# Patient Record
Sex: Male | Born: 1980
Health system: Southern US, Community
[De-identification: ages and names within clinical notes are randomized; demographics above are authoritative.]

## PROBLEM LIST (undated history)

## (undated) DIAGNOSIS — Z973 Presence of spectacles and contact lenses: Secondary | ICD-10-CM

## (undated) DIAGNOSIS — I82409 Acute embolism and thrombosis of unspecified deep veins of unspecified lower extremity: Secondary | ICD-10-CM

## (undated) DIAGNOSIS — T7840XA Allergy, unspecified, initial encounter: Secondary | ICD-10-CM

## (undated) DIAGNOSIS — J45909 Unspecified asthma, uncomplicated: Secondary | ICD-10-CM

## (undated) DIAGNOSIS — K219 Gastro-esophageal reflux disease without esophagitis: Secondary | ICD-10-CM

## (undated) DIAGNOSIS — K259 Gastric ulcer, unspecified as acute or chronic, without hemorrhage or perforation: Secondary | ICD-10-CM

## (undated) DIAGNOSIS — R7989 Other specified abnormal findings of blood chemistry: Secondary | ICD-10-CM

## (undated) DIAGNOSIS — I10 Essential (primary) hypertension: Secondary | ICD-10-CM

## (undated) DIAGNOSIS — G4730 Sleep apnea, unspecified: Secondary | ICD-10-CM

## (undated) DIAGNOSIS — F419 Anxiety disorder, unspecified: Secondary | ICD-10-CM

## (undated) DIAGNOSIS — G43909 Migraine, unspecified, not intractable, without status migrainosus: Secondary | ICD-10-CM

## (undated) DIAGNOSIS — F32A Depression, unspecified: Secondary | ICD-10-CM

## (undated) DIAGNOSIS — F909 Attention-deficit hyperactivity disorder, unspecified type: Secondary | ICD-10-CM

## (undated) HISTORY — DX: Gastro-esophageal reflux disease without esophagitis: K21.9

## (undated) HISTORY — DX: Sleep apnea, unspecified: G47.30

## (undated) HISTORY — DX: Allergy, unspecified, initial encounter: T78.40XA

## (undated) HISTORY — DX: Gastric ulcer, unspecified as acute or chronic, without hemorrhage or perforation: K25.9

## (undated) HISTORY — DX: Migraine, unspecified, not intractable, without status migrainosus: G43.909

## (undated) HISTORY — DX: Other specified abnormal findings of blood chemistry: R79.89

## (undated) HISTORY — DX: Presence of spectacles and contact lenses: Z97.3

## (undated) HISTORY — DX: Anxiety disorder, unspecified: F41.9

## (undated) HISTORY — DX: Depression, unspecified: F32.A

---
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
## 2001-02-20 ENCOUNTER — Emergency Department (HOSPITAL_COMMUNITY): Admission: EM | Admit: 2001-02-20 | Discharge: 2001-02-21 | Payer: Self-pay | Admitting: *Deleted

## 2001-04-20 ENCOUNTER — Emergency Department (HOSPITAL_COMMUNITY): Admission: EM | Admit: 2001-04-20 | Discharge: 2001-04-20 | Payer: Self-pay | Admitting: Emergency Medicine

## 2001-04-20 ENCOUNTER — Encounter: Payer: Self-pay | Admitting: Emergency Medicine

## 2002-08-19 ENCOUNTER — Encounter: Payer: Self-pay | Admitting: Emergency Medicine

## 2002-08-19 ENCOUNTER — Emergency Department (HOSPITAL_COMMUNITY): Admission: EM | Admit: 2002-08-19 | Discharge: 2002-08-19 | Payer: Self-pay | Admitting: Emergency Medicine

## 2004-04-07 ENCOUNTER — Emergency Department (HOSPITAL_COMMUNITY): Admission: EM | Admit: 2004-04-07 | Discharge: 2004-04-07 | Payer: Self-pay | Admitting: Emergency Medicine

## 2004-11-04 ENCOUNTER — Emergency Department (HOSPITAL_COMMUNITY): Admission: EM | Admit: 2004-11-04 | Discharge: 2004-11-04 | Payer: Self-pay | Admitting: Emergency Medicine

## 2005-03-17 HISTORY — PX: ESOPHAGOGASTRODUODENOSCOPY: SHX1529

## 2005-03-17 HISTORY — PX: COLONOSCOPY: SHX174

## 2005-04-06 ENCOUNTER — Emergency Department (HOSPITAL_COMMUNITY): Admission: EM | Admit: 2005-04-06 | Discharge: 2005-04-06 | Payer: Self-pay | Admitting: Emergency Medicine

## 2005-04-20 ENCOUNTER — Emergency Department (HOSPITAL_COMMUNITY): Admission: EM | Admit: 2005-04-20 | Discharge: 2005-04-21 | Payer: Self-pay | Admitting: Emergency Medicine

## 2005-08-30 ENCOUNTER — Emergency Department (HOSPITAL_COMMUNITY): Admission: EM | Admit: 2005-08-30 | Discharge: 2005-08-30 | Payer: Self-pay | Admitting: Emergency Medicine

## 2005-09-06 ENCOUNTER — Emergency Department (HOSPITAL_COMMUNITY): Admission: EM | Admit: 2005-09-06 | Discharge: 2005-09-06 | Payer: Self-pay | Admitting: Emergency Medicine

## 2005-12-18 ENCOUNTER — Emergency Department (HOSPITAL_COMMUNITY): Admission: EM | Admit: 2005-12-18 | Discharge: 2005-12-18 | Payer: Self-pay | Admitting: Emergency Medicine

## 2005-12-19 ENCOUNTER — Encounter (INDEPENDENT_AMBULATORY_CARE_PROVIDER_SITE_OTHER): Payer: Self-pay | Admitting: Specialist

## 2005-12-19 ENCOUNTER — Ambulatory Visit (HOSPITAL_COMMUNITY): Admission: RE | Admit: 2005-12-19 | Discharge: 2005-12-19 | Payer: Self-pay | Admitting: Gastroenterology

## 2006-01-08 ENCOUNTER — Emergency Department (HOSPITAL_COMMUNITY): Admission: EM | Admit: 2006-01-08 | Discharge: 2006-01-08 | Payer: Self-pay | Admitting: Emergency Medicine

## 2006-03-09 ENCOUNTER — Emergency Department (HOSPITAL_COMMUNITY): Admission: EM | Admit: 2006-03-09 | Discharge: 2006-03-09 | Payer: Self-pay | Admitting: Emergency Medicine

## 2006-03-17 ENCOUNTER — Emergency Department (HOSPITAL_COMMUNITY): Admission: EM | Admit: 2006-03-17 | Discharge: 2006-03-17 | Payer: Self-pay | Admitting: Emergency Medicine

## 2006-04-18 ENCOUNTER — Emergency Department (HOSPITAL_COMMUNITY): Admission: EM | Admit: 2006-04-18 | Discharge: 2006-04-19 | Payer: Self-pay | Admitting: Emergency Medicine

## 2006-04-19 ENCOUNTER — Emergency Department (HOSPITAL_COMMUNITY): Admission: EM | Admit: 2006-04-19 | Discharge: 2006-04-19 | Payer: Self-pay | Admitting: Family Medicine

## 2006-07-21 ENCOUNTER — Emergency Department (HOSPITAL_COMMUNITY): Admission: EM | Admit: 2006-07-21 | Discharge: 2006-07-21 | Payer: Self-pay | Admitting: Emergency Medicine

## 2006-08-31 ENCOUNTER — Emergency Department (HOSPITAL_COMMUNITY): Admission: EM | Admit: 2006-08-31 | Discharge: 2006-08-31 | Payer: Self-pay | Admitting: Emergency Medicine

## 2006-09-10 ENCOUNTER — Emergency Department (HOSPITAL_COMMUNITY): Admission: EM | Admit: 2006-09-10 | Discharge: 2006-09-10 | Payer: Self-pay | Admitting: Emergency Medicine

## 2007-05-10 ENCOUNTER — Emergency Department (HOSPITAL_COMMUNITY): Admission: EM | Admit: 2007-05-10 | Discharge: 2007-05-10 | Payer: Self-pay | Admitting: Emergency Medicine

## 2007-10-10 ENCOUNTER — Emergency Department (HOSPITAL_COMMUNITY): Admission: EM | Admit: 2007-10-10 | Discharge: 2007-10-10 | Payer: Self-pay | Admitting: Emergency Medicine

## 2007-10-29 ENCOUNTER — Emergency Department (HOSPITAL_COMMUNITY): Admission: EM | Admit: 2007-10-29 | Discharge: 2007-10-29 | Payer: Self-pay | Admitting: Emergency Medicine

## 2008-06-22 ENCOUNTER — Emergency Department (HOSPITAL_COMMUNITY): Admission: EM | Admit: 2008-06-22 | Discharge: 2008-06-22 | Payer: Self-pay | Admitting: Emergency Medicine

## 2008-12-17 ENCOUNTER — Emergency Department (HOSPITAL_COMMUNITY): Admission: EM | Admit: 2008-12-17 | Discharge: 2008-12-17 | Payer: Self-pay | Admitting: Emergency Medicine

## 2009-01-24 ENCOUNTER — Emergency Department (HOSPITAL_COMMUNITY): Admission: EM | Admit: 2009-01-24 | Discharge: 2009-01-24 | Payer: Self-pay | Admitting: Emergency Medicine

## 2009-03-16 ENCOUNTER — Emergency Department (HOSPITAL_COMMUNITY): Admission: EM | Admit: 2009-03-16 | Discharge: 2009-03-16 | Payer: Self-pay | Admitting: Emergency Medicine

## 2009-12-27 ENCOUNTER — Emergency Department (HOSPITAL_COMMUNITY): Admission: EM | Admit: 2009-12-27 | Discharge: 2009-12-27 | Payer: Self-pay | Admitting: Family Medicine

## 2010-06-17 LAB — COMPREHENSIVE METABOLIC PANEL
AST: 32 U/L (ref 0–37)
Chloride: 104 mEq/L (ref 96–112)
Creatinine, Ser: 1.12 mg/dL (ref 0.4–1.5)
GFR calc Af Amer: >60 mL/min (ref >60)
GFR calc non Af Amer: >60 mL/min (ref >60)
Total Bilirubin: 0.5 mg/dL (ref 0.3–1.2)

## 2010-06-17 LAB — DIFFERENTIAL
Eosinophils Relative: 1 % (ref 0–5)
Lymphs Abs: 3.1 10*3/uL (ref 0.7–4.0)
Monocytes Absolute: 0.6 10*3/uL (ref 0.1–1.0)
Monocytes Relative: 8 % (ref 3–12)
Neutro Abs: 3.4 10*3/uL (ref 1.7–7.7)

## 2010-06-17 LAB — CBC
HCT: 42.7 % (ref 39.0–52.0)
Hemoglobin: 14.6 g/dL (ref 13.0–17.0)
MCV: 86.1 fL (ref 78.0–100.0)
Platelets: 262 10*3/uL (ref 150–400)
RBC: 4.96 MIL/uL (ref 4.22–5.81)
RDW: 14 % (ref 11.5–15.5)
WBC: 7.2 10*3/uL (ref 4.0–10.5)

## 2010-07-07 ENCOUNTER — Emergency Department (HOSPITAL_COMMUNITY)
Admission: EM | Admit: 2010-07-07 | Discharge: 2010-07-07 | Disposition: A | Discharge status: Home / Self Care | Payer: Medicaid Other | Attending: Emergency Medicine | Admitting: Emergency Medicine

## 2010-07-07 DIAGNOSIS — R079 Chest pain, unspecified: Secondary | ICD-10-CM | POA: Insufficient documentation

## 2010-07-07 DIAGNOSIS — K219 Gastro-esophageal reflux disease without esophagitis: Secondary | ICD-10-CM | POA: Insufficient documentation

## 2010-07-07 DIAGNOSIS — M79609 Pain in unspecified limb: Secondary | ICD-10-CM

## 2010-10-17 ENCOUNTER — Emergency Department (HOSPITAL_COMMUNITY)
Admission: EM | Admit: 2010-10-17 | Discharge: 2010-10-18 | Disposition: A | Discharge status: Home / Self Care | Payer: Medicaid Other | Attending: Emergency Medicine | Admitting: Emergency Medicine

## 2010-10-17 ENCOUNTER — Emergency Department (HOSPITAL_COMMUNITY): Payer: Medicaid Other

## 2010-10-17 DIAGNOSIS — J189 Pneumonia, unspecified organism: Secondary | ICD-10-CM | POA: Insufficient documentation

## 2010-10-17 DIAGNOSIS — R05 Cough: Secondary | ICD-10-CM | POA: Insufficient documentation

## 2010-10-17 DIAGNOSIS — R509 Fever, unspecified: Secondary | ICD-10-CM | POA: Insufficient documentation

## 2010-10-17 DIAGNOSIS — R059 Cough, unspecified: Secondary | ICD-10-CM | POA: Insufficient documentation

## 2010-10-17 DIAGNOSIS — K219 Gastro-esophageal reflux disease without esophagitis: Secondary | ICD-10-CM | POA: Insufficient documentation

## 2010-10-17 LAB — CBC
MCH: 28.5 pg (ref 26.0–34.0)
Platelets: 210 10*3/uL (ref 150–400)
RBC: 4.88 MIL/uL (ref 4.22–5.81)

## 2010-10-17 LAB — POCT I-STAT, CHEM 8
BUN: 15 mg/dL (ref 6–23)
Calcium, Ion: 1.19 mmol/L (ref 1.12–1.32)
HCT: 43 % (ref 39.0–52.0)
Hemoglobin: 14.6 g/dL (ref 13.0–17.0)
Sodium: 138 mEq/L (ref 135–145)
TCO2: 28 mmol/L (ref 0–100)

## 2010-10-17 LAB — DIFFERENTIAL
Basophils Absolute: 0 10*3/uL (ref 0.0–0.1)
Basophils Relative: 0 % (ref 0–1)
Eosinophils Absolute: 0 10*3/uL (ref 0.0–0.7)
Monocytes Relative: 9 % (ref 3–12)
Neutrophils Relative %: 56 % (ref 43–77)

## 2010-12-13 LAB — DIFFERENTIAL
Eosinophils Relative: 0
Lymphocytes Relative: 36
Lymphs Abs: 2.5
Monocytes Absolute: 0.4

## 2010-12-13 LAB — URINALYSIS, ROUTINE W REFLEX MICROSCOPIC
Ketones, ur: NEGATIVE
Specific Gravity, Urine: 1.029
pH: 6.5

## 2010-12-13 LAB — CBC
MCHC: 34.5
MCV: 82.7
Platelets: 305

## 2010-12-13 LAB — COMPREHENSIVE METABOLIC PANEL
AST: 28
Albumin: 4.5
Calcium: 9.6
Creatinine, Ser: 1.04
GFR calc Af Amer: >60
Total Protein: 7.4

## 2010-12-13 LAB — POCT I-STAT, CHEM 8
BUN: 11
Calcium, Ion: 1.25
Creatinine, Ser: 1.2
TCO2: 28

## 2010-12-13 LAB — POCT CARDIAC MARKERS
Myoglobin, poc: 78.4
Operator id: 294521
Troponin i, poc: <0.05
Troponin i, poc: <0.05

## 2010-12-13 LAB — LIPASE, BLOOD: Lipase: 23

## 2011-01-01 LAB — CBC
HCT: 41.3
Hemoglobin: 13.8
MCHC: 33.5
MCV: 83
Platelets: 267
RDW: 13.6

## 2011-01-01 LAB — I-STAT 8, (EC8 V) (CONVERTED LAB)
Acid-Base Excess: 1
BUN: 12
Chloride: 105
Glucose, Bld: 95
Potassium: 4.2
pH, Ven: 7.32 — ABNORMAL HIGH

## 2011-01-01 LAB — URINALYSIS, ROUTINE W REFLEX MICROSCOPIC
Glucose, UA: NEGATIVE
Hgb urine dipstick: NEGATIVE
Ketones, ur: NEGATIVE
pH: 5.5

## 2011-01-01 LAB — RAPID URINE DRUG SCREEN, HOSP PERFORMED
Amphetamines: NOT DETECTED
Barbiturates: NOT DETECTED
Benzodiazepines: NOT DETECTED
Cocaine: NOT DETECTED

## 2011-01-01 LAB — DIFFERENTIAL
Basophils Absolute: 0
Basophils Relative: 1
Eosinophils Absolute: 0.1
Eosinophils Relative: 1
Monocytes Absolute: 0.6

## 2011-01-01 LAB — POCT I-STAT CREATININE
Creatinine, Ser: 1.2
Operator id: 285841

## 2011-01-01 LAB — POCT CARDIAC MARKERS: Troponin i, poc: <0.05

## 2011-08-04 ENCOUNTER — Ambulatory Visit: Payer: Managed Care, Other (non HMO)

## 2011-08-04 ENCOUNTER — Ambulatory Visit (INDEPENDENT_AMBULATORY_CARE_PROVIDER_SITE_OTHER): Payer: Managed Care, Other (non HMO) | Admitting: Family Medicine

## 2011-08-04 VITALS — BP 150/103 | HR 83 | Temp 98.2°F | Resp 20 | Wt 281.0 lb

## 2011-08-04 DIAGNOSIS — R1011 Right upper quadrant pain: Secondary | ICD-10-CM

## 2011-08-04 LAB — POCT CBC
HCT, POC: 40.8 % — AB (ref 43.5–53.7)
Hemoglobin: 13.3 g/dL — AB (ref 14.1–18.1)
Lymph, poc: 4.1 — AB (ref 0.6–3.4)
MCHC: 32.6 g/dL (ref 31.8–35.4)
MCV: 85.3 fL (ref 80–97)
POC Granulocyte: 3.5 (ref 2–6.9)

## 2011-08-04 MED ORDER — KETOROLAC TROMETHAMINE 60 MG/2ML IM SOLN
60.0000 mg | Freq: Once | INTRAMUSCULAR | Status: AC
  Administered 2011-08-04: 60 mg via INTRAMUSCULAR

## 2011-08-04 MED ORDER — HYDROCODONE-ACETAMINOPHEN 7.5-750 MG PO TABS: 1.0000 | ORAL_TABLET | Freq: Three times a day (TID) | ORAL | Status: AC | PRN

## 2011-08-04 NOTE — Progress Notes (Signed)
  Subjective:    Patient ID: Joel Wallace, male    DOB: [birth year], 30 y.o.   MRN: [id number]  HPI 31 yo male on Wellbutrin here for SOB, dizziness, and blurry vision.  Started about 4:30 at work.  At a cheeseburger about 3.  RUQ.  Severe.  Stopped briefly once and then resumed.  Can't characterize better.  Makes him SOB (hurts to take a deep breath), sweaty, and light headed.  Headache.  No nausea.  No chest pain.  H/O of HTN recently, not yet on treatment.  Goes to [employer]. Has had chol checked and normal per his account. Denies family history of heart problems, liver, or Gall bladde.r   Does endorse similar pain in same area though much less severe off an on for several months, usually after eating.   No history of abdominal surgeries.    Review of Systems Negative except as per HPI     Objective:   Physical Exam  Constitutional: Vital signs are normal. He appears well-developed and well-nourished. He is active.  Cardiovascular: Normal rate, regular rhythm, normal heart sounds and normal pulses.   Pulmonary/Chest: Effort normal and breath sounds normal.  Abdominal: Soft. Normal appearance and bowel sounds are normal. He exhibits no distension and no mass. There is no hepatosplenomegaly. There is tenderness. There is no rigidity, no rebound, no guarding, no CVA tenderness, no tenderness at McBurney's point and negative Murphy's sign. No hernia.       Abdominal pain is diffuse but worst in RUQ with positive muphy's sign.   Neurological: He is alert.   Results for orders placed in visit on 08/04/11  POCT CBC      Component Value Range   WBC 8.2  4.6 - 10.2 (K/uL)   Lymph, poc 4.1 (*) 0.6 - 3.4    POC LYMPH PERCENT 50.6 (*) 10 - 50 (%L)   MID (cbc) 0.6  0 - 0.9    POC MID % 7.2  0 - 12 (%M)   POC Granulocyte 3.5  2 - 6.9    Granulocyte percent 42.2  37 - 80 (%G)   RBC 4.78  4.69 - 6.13 (M/uL)   Hemoglobin 13.3 (*) 14.1 - 18.1 (g/dL)   HCT, POC 84.6 (*) 96.2 -  53.7 (%)   MCV 85.3  80 - 97 (fL)   MCH, POC 27.8  27 - 31.2 (pg)   MCHC 32.6  31.8 - 35.4 (g/dL)   RDW, POC 95.2     Platelet Count, POC 299  142 - 424 (K/uL)   MPV 8.4  0 - 99.8 (fL)   [facility] Primary radiology reading by Dr. Georgiana Shore: NAD       Assessment & Plan:  NOrmal lab and xray and EKG.  Suspect gall bladder.  Discussed going to ED now vs pain meds and U/S of abdomen first thing in the morning.  paitent would prefer to try to wait until morning.  Toradol here.  vicodin 7.5-500 q 8 hours prn #20 no refills.  Clear liquids only tonight.  Abd U/S first thing in the AM.  If worsens overnight, to ED. Patient agrees.

## 2011-08-05 ENCOUNTER — Telehealth: Payer: Self-pay

## 2011-08-05 ENCOUNTER — Ambulatory Visit
Admission: RE | Admit: 2011-08-05 | Discharge: 2011-08-05 | Disposition: A | Discharge status: Home / Self Care | Payer: Managed Care, Other (non HMO) | Source: Ambulatory Visit | Attending: Family Medicine | Admitting: Family Medicine

## 2011-08-05 DIAGNOSIS — R1011 Right upper quadrant pain: Secondary | ICD-10-CM

## 2011-08-05 LAB — COMPREHENSIVE METABOLIC PANEL
Albumin: 5 g/dL (ref 3.5–5.2)
CO2: 28 mEq/L (ref 19–32)
Glucose, Bld: 72 mg/dL (ref 70–99)
Potassium: 4.5 mEq/L (ref 3.5–5.3)
Sodium: 143 mEq/L (ref 135–145)
Total Bilirubin: 0.4 mg/dL (ref 0.3–1.2)
Total Protein: 7.4 g/dL (ref 6.0–8.3)

## 2011-08-05 NOTE — Telephone Encounter (Signed)
Pt calling in about results of test that were taking today. Please contact when results are ready to be viewed.Marland Kitchen

## 2011-08-06 NOTE — Telephone Encounter (Signed)
Can you please review labs for pt. Thanks.

## 2011-08-07 NOTE — Telephone Encounter (Signed)
Normal abdominal ultrasound.  Normal labs.  Nothing showing up abnormal.  If pain is still the same he should return.

## 2011-08-07 NOTE — Telephone Encounter (Signed)
Pt.notified

## 2012-05-22 ENCOUNTER — Emergency Department (HOSPITAL_COMMUNITY)
Admission: EM | Admit: 2012-05-22 | Discharge: 2012-05-22 | Disposition: A | Discharge status: Home / Self Care | Payer: Managed Care, Other (non HMO) | Attending: Emergency Medicine | Admitting: Emergency Medicine

## 2012-05-22 ENCOUNTER — Encounter (HOSPITAL_COMMUNITY): Payer: Self-pay | Admitting: Emergency Medicine

## 2012-05-22 DIAGNOSIS — X503XXA Overexertion from repetitive movements, initial encounter: Secondary | ICD-10-CM | POA: Insufficient documentation

## 2012-05-22 DIAGNOSIS — Z8679 Personal history of other diseases of the circulatory system: Secondary | ICD-10-CM | POA: Insufficient documentation

## 2012-05-22 DIAGNOSIS — Y9389 Activity, other specified: Secondary | ICD-10-CM | POA: Insufficient documentation

## 2012-05-22 DIAGNOSIS — Z8659 Personal history of other mental and behavioral disorders: Secondary | ICD-10-CM | POA: Insufficient documentation

## 2012-05-22 DIAGNOSIS — J45909 Unspecified asthma, uncomplicated: Secondary | ICD-10-CM | POA: Insufficient documentation

## 2012-05-22 DIAGNOSIS — E669 Obesity, unspecified: Secondary | ICD-10-CM | POA: Insufficient documentation

## 2012-05-22 DIAGNOSIS — IMO0002 Reserved for concepts with insufficient information to code with codable children: Secondary | ICD-10-CM | POA: Insufficient documentation

## 2012-05-22 DIAGNOSIS — Z8709 Personal history of other diseases of the respiratory system: Secondary | ICD-10-CM | POA: Insufficient documentation

## 2012-05-22 DIAGNOSIS — Y929 Unspecified place or not applicable: Secondary | ICD-10-CM | POA: Insufficient documentation

## 2012-05-22 DIAGNOSIS — M545 Low back pain: Secondary | ICD-10-CM

## 2012-05-22 HISTORY — DX: Essential (primary) hypertension: I10

## 2012-05-22 HISTORY — DX: Attention-deficit hyperactivity disorder, unspecified type: F90.9

## 2012-05-22 HISTORY — DX: Unspecified asthma, uncomplicated: J45.909

## 2012-05-22 MED ORDER — DIAZEPAM 5 MG PO TABS: 5.0000 mg | ORAL_TABLET | Freq: Four times a day (QID) | ORAL | Status: DC | PRN

## 2012-05-22 MED ORDER — MORPHINE SULFATE 4 MG/ML IJ SOLN
6.0000 mg | Freq: Once | INTRAMUSCULAR | Status: AC
  Administered 2012-05-22: 6 mg via INTRAMUSCULAR
  Filled 2012-05-22: qty 2

## 2012-05-22 MED ORDER — ONDANSETRON 4 MG PO TBDP
4.0000 mg | ORAL_TABLET | Freq: Once | ORAL | Status: AC
  Administered 2012-05-22: 4 mg via ORAL
  Filled 2012-05-22: qty 1

## 2012-05-22 MED ORDER — OXYCODONE-ACETAMINOPHEN 5-325 MG PO TABS: ORAL_TABLET | ORAL | Status: DC

## 2012-05-22 NOTE — ED Provider Notes (Signed)
History     CSN: [id number]  Arrival date & time 05/22/12  7829   First MD Initiated Contact with Patient 05/22/12 [phone]      Chief Complaint  Patient presents with  . Back Pain  . Knee Pain    (Consider location/radiation/quality/duration/timing/severity/associated sxs/prior treatment) HPI  Joel Wallace is a 32 y.o. male complaining of low back pain after he was lifting heavy weight yesterday patient also was for work. He describes his pain as a severe, this is exacerbated by weightbearing. He denies any numbness, paresthesia, change in bowel or bladder habits, fever, history of IV drug use or cancer.   Past Medical History  Diagnosis Date  . Asthma   . ADHD (attention deficit hyperactivity disorder)   . Hypertension     No past surgical history on file.  No family history on file.  History  Substance Use Topics  . Smoking status: Never Smoker   . Smokeless tobacco: Not on file  . Alcohol Use: Not on file      Review of Systems  Constitutional: Negative for fever.  HENT: Negative for neck pain.   Gastrointestinal: Negative for nausea, vomiting and abdominal pain.  Genitourinary: Negative for dysuria and difficulty urinating.  Musculoskeletal: Positive for back pain.  Neurological: Negative for weakness and numbness.    Allergies  Review of patient's allergies indicates no known allergies.  Home Medications   Current Outpatient Rx  Name  Route  Sig  Dispense  Refill  . aspirin 325 MG tablet   Oral   Take 325 mg by mouth every 6 (six) hours as needed for pain.         . diazepam (VALIUM) 5 MG tablet   Oral   Take 1 tablet (5 mg total) by mouth every 6 (six) hours as needed (muscle spasms).   10 tablet   0   . oxyCODONE-acetaminophen (PERCOCET/ROXICET) 5-325 MG per tablet      1 to 2 tabs PO q6hrs  PRN for pain   15 tablet   0     BP 131/99  Pulse 71  Temp(Src) 98.2 F (36.8 C) (Oral)  SpO2 100%  Physical Exam  Nursing note and  vitals reviewed. Constitutional: He is oriented to person, place, and time. He appears well-developed and well-nourished. No distress.  Obese   HENT:  Head: Normocephalic.  Eyes: Conjunctivae and EOM are normal.  Cardiovascular: Normal rate.   Pulmonary/Chest: Effort normal. No stridor.  Musculoskeletal: Normal range of motion.  Significantly reduced range of motion in spinal flexion, or tenderness to palpation of the midline cervical, thoracic, or lumbar spine. Bilateral lumbar paraspinal muscular spasm with tenderness to palpation  Neurological: He is alert and oriented to person, place, and time.  Strength is 5 out of 5x4 extremities and distal sensation is grossly intact.  Psychiatric: He has a normal mood and affect.    ED Course  Procedures (including critical care time)  Labs Reviewed - No data to display No results found.   1. Low back pain       MDM   Joel Wallace is a 32 y.o. male with uncomplicated low-back pain.    Filed Vitals:   05/22/12 0838  BP: 131/99  Pulse: 71  Temp: 98.2 F (36.8 C)  TempSrc: Oral  SpO2: 100%     Pt verbalized understanding and agrees with care plan. Outpatient follow-up and return precautions given.    Discharge Medication List as of 05/22/2012  9:11  AM    START taking these medications   Details  diazepam (VALIUM) 5 MG tablet Take 1 tablet (5 mg total) by mouth every 6 (six) hours as needed (muscle spasms)., Starting 05/22/2012, Until Discontinued, Print    oxyCODONE-acetaminophen (PERCOCET/ROXICET) 5-325 MG per tablet 1 to 2 tabs PO q6hrs  PRN for pain, Print              Wynetta Emery, PA-C 05/22/12 1629

## 2012-05-22 NOTE — ED Notes (Addendum)
Patient states he was lifting wood yesterday and has had low back pain and bilateral knee pain since.  Patient is also non-compliant with B/P meds for about 6 months.

## 2012-05-23 NOTE — ED Provider Notes (Signed)
Medical screening examination/treatment/procedure(s) were performed by non-physician practitioner and as supervising physician I was immediately available for consultation/collaboration.  Douglas Delo, MD 05/23/12 1548 

## 2012-12-08 ENCOUNTER — Encounter (HOSPITAL_COMMUNITY): Payer: Self-pay

## 2012-12-08 ENCOUNTER — Emergency Department (HOSPITAL_COMMUNITY)
Admission: EM | Admit: 2012-12-08 | Discharge: 2012-12-08 | Disposition: A | Discharge status: Home / Self Care | Payer: Commercial Managed Care - PPO | Source: Home / Self Care | Attending: Family Medicine | Admitting: Family Medicine

## 2012-12-08 ENCOUNTER — Emergency Department (INDEPENDENT_AMBULATORY_CARE_PROVIDER_SITE_OTHER): Payer: Commercial Managed Care - PPO

## 2012-12-08 DIAGNOSIS — S6992XA Unspecified injury of left wrist, hand and finger(s), initial encounter: Secondary | ICD-10-CM

## 2012-12-08 DIAGNOSIS — S6990XA Unspecified injury of unspecified wrist, hand and finger(s), initial encounter: Secondary | ICD-10-CM

## 2012-12-08 MED ORDER — IBUPROFEN 800 MG PO TABS: 800.0000 mg | ORAL_TABLET | Freq: Three times a day (TID) | ORAL | Status: DC

## 2012-12-08 NOTE — ED Provider Notes (Signed)
Medical screening examination/treatment/procedure(s) were performed by resident physician or non-physician practitioner and as supervising physician I was immediately available for consultation/collaboration.   KINDL,JAMES DOUGLAS MD.   James D Kindl, MD 12/08/12 2046 

## 2012-12-08 NOTE — ED Provider Notes (Signed)
CSN: [id number]     Arrival date & time 12/08/12  1918 History   First MD Initiated Contact with Patient 12/08/12 2005     Chief Complaint  Patient presents with  . Hand Injury   (Consider location/radiation/quality/duration/timing/severity/associated sxs/prior Treatment) HPI Comments: 32 yo male dropped heavy plate on left hand at work last night and now having increased pain and swelling.  Patient is a 32 y.o. male presenting with hand injury.  Hand Injury   Past Medical History  Diagnosis Date  . Asthma   . ADHD (attention deficit hyperactivity disorder)   . Hypertension    History reviewed. No pertinent past surgical history. History reviewed. No pertinent family history. History  Substance Use Topics  . Smoking status: Never Smoker   . Smokeless tobacco: Not on file  . Alcohol Use: Not on file    Review of Systems  Constitutional: Negative.   Respiratory: Negative.   Cardiovascular: Negative.   Musculoskeletal: Positive for myalgias, joint swelling and arthralgias.  Skin: Negative.   Neurological: Negative.   Psychiatric/Behavioral: Negative.     Allergies  Review of patient's allergies indicates no known allergies.  Home Medications   Current Outpatient Rx  Name  Route  Sig  Dispense  Refill  . aspirin 325 MG tablet   Oral   Take 325 mg by mouth every 6 (six) hours as needed for pain.         . diazepam (VALIUM) 5 MG tablet   Oral   Take 1 tablet (5 mg total) by mouth every 6 (six) hours as needed (muscle spasms).   10 tablet   0   . ibuprofen (ADVIL,MOTRIN) 800 MG tablet   Oral   Take 1 tablet (800 mg total) by mouth 3 (three) times daily.   21 tablet   0   . oxyCODONE-acetaminophen (PERCOCET/ROXICET) 5-325 MG per tablet      1 to 2 tabs PO q6hrs  PRN for pain   15 tablet   0    BP 140/54  Pulse 87  Temp(Src) 97.7 F (36.5 C) (Oral)  Resp 16  SpO2 100% Physical Exam  Nursing note and vitals reviewed. Constitutional: He is oriented  to person, place, and time. He appears well-developed and well-nourished.  Cardiovascular: Normal rate, regular rhythm, normal heart sounds and intact distal pulses.   Pulmonary/Chest: Effort normal and breath sounds normal.  Musculoskeletal:  Left hand with pain/ tenderness at PIP and distal metacrpal of 4th and 5th digits. Mild edema at same area with Mild decreased strength. Unable to make tight fist with out pain.  Neurological: He is alert and oriented to person, place, and time. He has normal reflexes. No cranial nerve deficit.  Skin: Skin is warm and dry.    ED Course  Procedures (including critical care time) Labs Review Labs Reviewed - No data to display Imaging Review Dg Hand Complete Left  12/08/2012   CLINICAL DATA:  Trauma, left hand injury  EXAM: LEFT HAND - COMPLETE 3+ VIEW  COMPARISON:  05/10/2007  FINDINGS: There is no evidence of fracture or dislocation. There is no evidence of arthropathy or other focal bone abnormality. Soft tissues are unremarkable.  IMPRESSION: Negative.   Electronically Signed   By: Natasha Mead   On: 12/08/2012 19:56    MDM  Hand injury at work with negative xray. Advised ice/ elevation/ rest if symptoms do not improve F/U hand specialist. Ibuprofen 800mg  1 po q8hr prn pain AD. OOW x2days. Advised of  possibility of nerve damage will go to ER if symptoms increased.  Elevated BP check in 1 week if still elevated needs evauation at PCP.    Berenice Primas, PA-C 12/08/12 2029

## 2012-12-08 NOTE — ED Notes (Signed)
States he dropped a "dock plate" from large truck onto his left hand late last night, used ice, ASA, and went to bed ; c/o continues to have pain in his hand

## 2013-05-09 ENCOUNTER — Encounter (HOSPITAL_COMMUNITY): Payer: Self-pay | Admitting: Emergency Medicine

## 2013-05-09 ENCOUNTER — Emergency Department (HOSPITAL_COMMUNITY)
Admission: EM | Admit: 2013-05-09 | Discharge: 2013-05-09 | Disposition: A | Discharge status: Home / Self Care | Payer: 59 | Attending: Emergency Medicine | Admitting: Emergency Medicine

## 2013-05-09 DIAGNOSIS — J45909 Unspecified asthma, uncomplicated: Secondary | ICD-10-CM | POA: Insufficient documentation

## 2013-05-09 DIAGNOSIS — L03019 Cellulitis of unspecified finger: Secondary | ICD-10-CM | POA: Insufficient documentation

## 2013-05-09 DIAGNOSIS — Z79899 Other long term (current) drug therapy: Secondary | ICD-10-CM | POA: Insufficient documentation

## 2013-05-09 DIAGNOSIS — F909 Attention-deficit hyperactivity disorder, unspecified type: Secondary | ICD-10-CM | POA: Insufficient documentation

## 2013-05-09 DIAGNOSIS — L03011 Cellulitis of right finger: Secondary | ICD-10-CM

## 2013-05-09 DIAGNOSIS — I10 Essential (primary) hypertension: Secondary | ICD-10-CM | POA: Insufficient documentation

## 2013-05-09 MED ORDER — HYDROCODONE-ACETAMINOPHEN 5-325 MG PO TABS
2.0000 | ORAL_TABLET | Freq: Once | ORAL | Status: AC
  Administered 2013-05-09: 2 via ORAL
  Filled 2013-05-09: qty 2

## 2013-05-09 MED ORDER — SULFAMETHOXAZOLE-TMP DS 800-160 MG PO TABS: 1.0000 | ORAL_TABLET | Freq: Two times a day (BID) | ORAL | Status: DC

## 2013-05-09 NOTE — ED Notes (Signed)
Pt states that approx. 1 week ago his R thumb started swelling after he bit off a hangnail. Area is now very swollen and painful despite soaking it. No acute distress at this time.

## 2013-05-09 NOTE — ED Provider Notes (Signed)
CSN: [id number]     Arrival date & time 05/09/13  1456 History   This chart was scribed for non-[id number] practitioner, Clyde CanterburyKelly Thuan Tippett-NP, working with Gavin PoundMichael Y. Oletta LamasGhim, MD by Smiley HousemanFallon Davis, ED Scribe. This patient was seen in room WTR7/WTR7 and the patient's care was started at 4:59 PM.  Chief Complaint  Patient presents with  . Hand Pain   The history is provided by the patient. No language interpreter was used.   HPI Comments: Joel BalRandy D Wallace is a 33 y.o. male who presents to the Emergency Department complaining of right thumb pain that started about 7 days ago.  Pt states he had a hand nail that he bit off and started to swell.  Pt reports he tried soaking it in Epsom salt, but the next morning the swelling worsened.  Pt denies taking any pain medication for his finger, but reports he was already taking pain medication for his knee.  Pt states tetanus is UTD.   Past Medical History  Diagnosis Date  . Asthma   . ADHD (attention deficit hyperactivity disorder)   . Hypertension    History reviewed. No pertinent past surgical history. History reviewed. No pertinent family history. History  Substance Use Topics  . Smoking status: Never Smoker   . Smokeless tobacco: Not on file  . Alcohol Use: Yes     Comment: rarely    Review of Systems  Constitutional: Negative for fever and chills.  Respiratory: Negative for shortness of breath.   Cardiovascular: Negative for chest pain.  Gastrointestinal: Negative for nausea, vomiting, abdominal pain and diarrhea.  Musculoskeletal: Negative for back pain.  Skin: Positive for wound (right thumb). Negative for color change and rash.  Neurological: Negative for syncope and headaches.  Psychiatric/Behavioral: Negative for behavioral problems and confusion.  All other systems reviewed and are negative.    Allergies  Review of patient's allergies indicates no known allergies.  Home Medications   Current Outpatient Rx  Name  Route  Sig  Dispense   Refill  . amphetamine-dextroamphetamine (ADDERALL XR) 20 MG 24 hr capsule   Oral   Take 20 mg by mouth daily.         Marland Kitchen. losartan (COZAAR) 25 MG tablet   Oral   Take 25 mg by mouth daily.         . traMADol (ULTRAM) 50 MG tablet   Oral   Take 50 mg by mouth every 6 (six) hours as needed (pain).          Triage Vitals: BP 135/80  Pulse 82  Temp(Src) 98 F (36.7 C) (Oral)  SpO2 95%  Physical Exam  Nursing note and vitals reviewed. Constitutional: He is oriented to person, place, and time. He appears well-developed and well-nourished. No distress.  HENT:  Head: Normocephalic and atraumatic.  Eyes: EOM are normal.  Neck: Neck supple. No tracheal deviation present.  Cardiovascular: Normal rate.   Pulmonary/Chest: Effort normal. No respiratory distress.  Musculoskeletal: Normal range of motion.  Neurological: He is alert and oriented to person, place, and time.  Skin: Skin is warm and dry.  Psychiatric: He has a normal mood and affect. His behavior is normal.    ED Course  Procedures (including critical care time) DIAGNOSTIC STUDIES: Oxygen Saturation is 95% on RA, adequate  by my interpretation.    COORDINATION OF CARE: 5:04 PM-Will discuss pt with Dr. Oletta LamasGhim.  Patient informed of current plan of treatment and evaluation and agrees with plan.    INCISION AND  DRAINAGE PROCEDURE NOTE: Patient identification was confirmed and verbal consent was obtained. This procedure was performed by Clyde Canterbury at 5:48 PM. Site: Right thumb nail bed Sterile procedures observed: yes Needle size: none Anesthetic used (type and amt): none Blade size: 11 Drainage: moderate Complexity: Simple Packing used: none Site anesthetized, incision made over site, wound drained and explored loculations, rinsed with copious amounts of normal saline, wound packed with sterile gauze, covered with dry, sterile dressing.  Pt tolerated procedure well without complications.  Instructions for  care discussed verbally and pt provided with additional written instructions for homecare and f/u.  5:50 PM-Will order Vicodin and discharge with an antibiotic.     MDM   Final diagnoses:  Paronychia of right thumb    Drainage of infection around right thumb nail. Procedure tolerated without difficulty, moderate amount of purulent drainage expressed. Prescribed Bactrim DS and hydrocodone for pain. Discussed plan of care with pt and he agrees. Return precautions given.   I personally performed the services described in this documentation, which was scribed in my presence. The recorded information has been reviewed and is accurate.      Irish Elders, NP 05/12/13 [phone]

## 2013-05-09 NOTE — Discharge Instructions (Signed)
Paronychia Paronychia is an inflammatory reaction involving the folds of the skin surrounding the fingernail. This is commonly caused by an infection in the skin around a nail. The most common cause of paronychia is frequent wetting of the hands (as seen with bartenders, food servers, nurses or others who wet their hands). This makes the skin around the fingernail susceptible to infection by bacteria (germs) or fungus. Other predisposing factors are:  Aggressive manicuring.  Nail biting.  Thumb sucking. The most common cause is a staphylococcal (a type of germ) infection, or a fungal (Candida) infection. When caused by a germ, it usually comes on suddenly with redness, swelling, pus and is often painful. It may get under the nail and form an abscess (collection of pus), or form an abscess around the nail. If the nail itself is infected with a fungus, the treatment is usually prolonged and may require oral medicine for up to one year. Your caregiver will determine the length of time treatment is required. The paronychia caused by bacteria (germs) may largely be avoided by not pulling on hangnails or picking at cuticles. When the infection occurs at the tips of the finger it is called felon. When the cause of paronychia is from the herpes simplex virus (HSV) it is called herpetic whitlow. TREATMENT  When an abscess is present treatment is often incision and drainage. This means that the abscess must be cut open so the pus can get out. When this is done, the following home care instructions should be followed. HOME CARE INSTRUCTIONS   It is important to keep the affected fingers very dry. Rubber or plastic gloves over cotton gloves should be used whenever the hand must be placed in water.  Keep wound clean, dry and dressed as suggested by your caregiver between warm soaks or warm compresses.  Soak in warm water for fifteen to twenty minutes three to four times per day for bacterial infections. Fungal  infections are very difficult to treat, so often require treatment for long periods of time.  For bacterial (germ) infections take antibiotics (medicine which kill germs) as directed and finish the prescription, even if the problem appears to be solved before the medicine is gone.  Only take over-the-counter or prescription medicines for pain, discomfort, or fever as directed by your caregiver. SEEK IMMEDIATE MEDICAL CARE IF:  You have redness, swelling, or increasing pain in the wound.  You notice pus coming from the wound.  You have a fever.  You notice a bad smell coming from the wound or dressing. Document Released: 08/27/2000 Document Revised: 05/26/2011 Document Reviewed: 04/28/2008 [facility] Patient Information 2014 [place], [place].  Take antibiotic and pain meds as prescribed Monitor for signs of infection or increased swelling Return if symptoms worsen

## 2013-05-13 NOTE — ED Provider Notes (Signed)
Medical screening examination/treatment/procedure(s) were performed by non-physician practitioner and as supervising physician I was immediately available for consultation/collaboration.  Mabrey Howland Y. Ruchi Stoney, MD 05/13/13 1050 

## 2013-05-27 ENCOUNTER — Other Ambulatory Visit (HOSPITAL_COMMUNITY): Payer: Self-pay | Admitting: Orthopedic Surgery

## 2013-05-27 DIAGNOSIS — M25562 Pain in left knee: Secondary | ICD-10-CM

## 2013-06-14 ENCOUNTER — Ambulatory Visit (HOSPITAL_COMMUNITY)
Admission: RE | Admit: 2013-06-14 | Discharge: 2013-06-14 | Disposition: A | Discharge status: Home / Self Care | Payer: 59 | Source: Ambulatory Visit | Attending: Orthopedic Surgery | Admitting: Orthopedic Surgery

## 2013-06-14 DIAGNOSIS — M25562 Pain in left knee: Secondary | ICD-10-CM

## 2013-06-14 DIAGNOSIS — M25569 Pain in unspecified knee: Secondary | ICD-10-CM | POA: Insufficient documentation

## 2014-06-16 ENCOUNTER — Encounter (HOSPITAL_COMMUNITY): Payer: Self-pay | Admitting: *Deleted

## 2014-06-16 ENCOUNTER — Emergency Department (INDEPENDENT_AMBULATORY_CARE_PROVIDER_SITE_OTHER): Payer: 59

## 2014-06-16 ENCOUNTER — Emergency Department (HOSPITAL_COMMUNITY)
Admission: EM | Admit: 2014-06-16 | Discharge: 2014-06-16 | Disposition: A | Discharge status: Home / Self Care | Payer: 59 | Source: Home / Self Care

## 2014-06-16 DIAGNOSIS — J111 Influenza due to unidentified influenza virus with other respiratory manifestations: Secondary | ICD-10-CM | POA: Diagnosis not present

## 2014-06-16 DIAGNOSIS — R091 Pleurisy: Secondary | ICD-10-CM | POA: Diagnosis not present

## 2014-06-16 MED ORDER — DICLOFENAC POTASSIUM 50 MG PO TABS: 50.0000 mg | ORAL_TABLET | Freq: Three times a day (TID) | ORAL | Status: DC

## 2014-06-16 NOTE — ED Provider Notes (Signed)
CSN: [id number]     Arrival date & time 06/16/14  1330 History   None    Chief Complaint  Patient presents with  . Flank Pain   (Consider location/radiation/quality/duration/timing/severity/associated sxs/prior Treatment) Patient is a 34 y.o. male presenting with flank pain. The history is provided by the patient.  Flank Pain This is a new problem. The current episode started more than 1 week ago. The problem has not changed since onset.Associated symptoms include chest pain. Pertinent negatives include no abdominal pain and no shortness of breath.    Past Medical History  Diagnosis Date  . Asthma   . ADHD (attention deficit hyperactivity disorder)   . Hypertension    History reviewed. No pertinent past surgical history. No family history on file. History  Substance Use Topics  . Smoking status: Never Smoker   . Smokeless tobacco: Not on file  . Alcohol Use: Yes     Comment: rarely    Review of Systems  Constitutional: Negative.   Respiratory: Negative for cough, shortness of breath and wheezing.   Cardiovascular: Positive for chest pain. Negative for palpitations and leg swelling.  Gastrointestinal: Negative.  Negative for abdominal pain.  Genitourinary: Positive for flank pain.    Allergies  Review of patient's allergies indicates no known allergies.  Home Medications   Prior to Admission medications   Medication Sig Start Date End Date Taking? Authorizing Provider  amphetamine-dextroamphetamine (ADDERALL XR) 20 MG 24 hr capsule Take 20 mg by mouth daily.    Historical Provider, MD  diclofenac (CATAFLAM) 50 MG tablet Take 1 tablet (50 mg total) by mouth 3 (three) times daily. 06/16/14   Linna HoffJames D Kindl, MD  losartan (COZAAR) 25 MG tablet Take 25 mg by mouth daily.    Historical Provider, MD  sulfamethoxazole-trimethoprim (BACTRIM DS) 800-160 MG per tablet Take 1 tablet by mouth 2 (two) times daily. 05/09/13   Irish EldersKelly Walker, NP  traMADol (ULTRAM) 50 MG tablet Take 50 mg by  mouth every 6 (six) hours as needed (pain).    Historical Provider, MD   BP 160/114 mmHg  Pulse 66  Temp(Src) 98.7 F (37.1 C) (Oral)  Resp 16 Physical Exam  Constitutional: He is oriented to person, place, and time. He appears well-developed and well-nourished. No distress.  Neck: Normal range of motion. Neck supple.  Cardiovascular: Normal rate, regular rhythm, normal heart sounds and intact distal pulses.   Pulmonary/Chest: Effort normal and breath sounds normal. He has no rales. He exhibits tenderness.  Abdominal: Soft. Bowel sounds are normal.  Lymphadenopathy:    He has no cervical adenopathy.  Neurological: He is alert and oriented to person, place, and time.  Skin: Skin is warm and dry.  Nursing note and vitals reviewed.   ED Course  Procedures (including critical care time) Labs Review Labs Reviewed - No data to display  Imaging Review Dg Chest 2 View  06/16/2014   CLINICAL DATA:  Right-sided chest pain  EXAM: CHEST  2 VIEW  COMPARISON:  10/17/2010  FINDINGS: Normal heart size and mediastinal contours. Stable mild asymmetric pleural thickening at the right apex. No acute infiltrate or edema. No effusion or pneumothorax. No acute osseous findings.  IMPRESSION: Negative chest.   Electronically Signed   By: Marnee SpringJonathon  Watts M.D.   On: 06/16/2014 15:30   X-rays reviewed and report per radiologist.   MDM   1. Influenzal pleurisy        Linna HoffJames D Kindl, MD 06/16/14 [phone]

## 2014-06-16 NOTE — ED Notes (Signed)
Pt  reports  Pain   r  Side  And  Back  Radiating    X  sev  Weeks  Slight  Nausea    denys  specefic   Injury     Pt  Reports     Pain  Worse  When he  Takes  A  Deep  Breath   Or  When  He  Coughs

## 2014-06-16 NOTE — Discharge Instructions (Signed)
Use medicine as needed, see your doctor if further problems. °

## 2015-04-02 ENCOUNTER — Other Ambulatory Visit: Payer: Self-pay | Admitting: Internal Medicine

## 2015-04-02 ENCOUNTER — Ambulatory Visit (HOSPITAL_COMMUNITY)
Admission: RE | Admit: 2015-04-02 | Discharge: 2015-04-02 | Disposition: A | Discharge status: Home / Self Care | Payer: 59 | Source: Ambulatory Visit | Attending: Internal Medicine | Admitting: Internal Medicine

## 2015-04-02 DIAGNOSIS — M79605 Pain in left leg: Secondary | ICD-10-CM

## 2015-04-02 DIAGNOSIS — M7989 Other specified soft tissue disorders: Secondary | ICD-10-CM | POA: Insufficient documentation

## 2015-04-02 DIAGNOSIS — M79669 Pain in unspecified lower leg: Secondary | ICD-10-CM | POA: Diagnosis not present

## 2015-04-02 DIAGNOSIS — M79662 Pain in left lower leg: Secondary | ICD-10-CM | POA: Diagnosis not present

## 2015-04-02 MED FILL — DEXTROAMP-AMPHET ER 20 MG C: 20 | 30 days supply | Qty: 30 | Fill #0

## 2015-04-02 NOTE — Progress Notes (Addendum)
VASCULAR LAB PRELIMINARY  PRELIMINARY  PRELIMINARY  PRELIMINARY  Bilateral lower extremity venous duplex completed.    Preliminary report:  Right:  No evidence of DVT, superficial thrombosis, or Baker's cyst. 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  No evidence of superficial thrombosis.  No Baker's cyst.  Tommey Barret, RVS 04/02/2015, 4:47 PM

## 2015-04-16 DIAGNOSIS — I82402 Acute embolism and thrombosis of unspecified deep veins of left lower extremity: Secondary | ICD-10-CM | POA: Diagnosis not present

## 2015-04-16 DIAGNOSIS — I10 Essential (primary) hypertension: Secondary | ICD-10-CM | POA: Diagnosis not present

## 2015-04-26 MED FILL — XARELTO 20 MG TABLET: 20 | 30 days supply | Qty: 30 | Fill #0

## 2015-04-26 MED FILL — LOSARTAN-HCTZ 100-25 MG TAB: 100-25 | 30 days supply | Qty: 30 | Fill #0

## 2015-05-04 MED FILL — DEXTROAMP-AMPHET ER 20 MG C: 20 | 30 days supply | Qty: 30 | Fill #0

## 2015-05-14 DIAGNOSIS — Z0000 Encounter for general adult medical examination without abnormal findings: Secondary | ICD-10-CM | POA: Diagnosis not present

## 2015-05-14 DIAGNOSIS — K625 Hemorrhage of anus and rectum: Secondary | ICD-10-CM | POA: Diagnosis not present

## 2015-05-14 DIAGNOSIS — E663 Overweight: Secondary | ICD-10-CM | POA: Diagnosis not present

## 2015-05-14 DIAGNOSIS — Z6841 Body Mass Index (BMI) 40.0 and over, adult: Secondary | ICD-10-CM | POA: Diagnosis not present

## 2015-05-14 DIAGNOSIS — I10 Essential (primary) hypertension: Secondary | ICD-10-CM | POA: Diagnosis not present

## 2015-05-14 DIAGNOSIS — F908 Attention-deficit hyperactivity disorder, other type: Secondary | ICD-10-CM | POA: Diagnosis not present

## 2015-05-14 DIAGNOSIS — I82409 Acute embolism and thrombosis of unspecified deep veins of unspecified lower extremity: Secondary | ICD-10-CM | POA: Diagnosis not present

## 2015-06-05 MED FILL — LOSARTAN-HCTZ 100-25 MG TAB: 100-25 | 30 days supply | Qty: 30 | Fill #1

## 2015-06-05 MED FILL — XARELTO 20 MG TABLET: 20 | 30 days supply | Qty: 30 | Fill #1

## 2015-06-05 MED FILL — DEXTROAMP-AMPHET ER 20 MG C: 20 | 30 days supply | Qty: 30 | Fill #0

## 2015-06-12 ENCOUNTER — Emergency Department (HOSPITAL_COMMUNITY): Payer: 59

## 2015-06-12 ENCOUNTER — Encounter (HOSPITAL_COMMUNITY): Payer: Self-pay | Admitting: Family Medicine

## 2015-06-12 ENCOUNTER — Emergency Department (HOSPITAL_COMMUNITY)
Admission: EM | Admit: 2015-06-12 | Discharge: 2015-06-12 | Disposition: A | Discharge status: Home / Self Care | Payer: 59 | Attending: Emergency Medicine | Admitting: Emergency Medicine

## 2015-06-12 DIAGNOSIS — R61 Generalized hyperhidrosis: Secondary | ICD-10-CM | POA: Insufficient documentation

## 2015-06-12 DIAGNOSIS — R0781 Pleurodynia: Secondary | ICD-10-CM | POA: Diagnosis not present

## 2015-06-12 DIAGNOSIS — F909 Attention-deficit hyperactivity disorder, unspecified type: Secondary | ICD-10-CM | POA: Diagnosis not present

## 2015-06-12 DIAGNOSIS — R42 Dizziness and giddiness: Secondary | ICD-10-CM | POA: Diagnosis not present

## 2015-06-12 DIAGNOSIS — J45901 Unspecified asthma with (acute) exacerbation: Secondary | ICD-10-CM | POA: Insufficient documentation

## 2015-06-12 DIAGNOSIS — R0602 Shortness of breath: Secondary | ICD-10-CM | POA: Diagnosis not present

## 2015-06-12 DIAGNOSIS — Z79899 Other long term (current) drug therapy: Secondary | ICD-10-CM | POA: Diagnosis not present

## 2015-06-12 DIAGNOSIS — R079 Chest pain, unspecified: Secondary | ICD-10-CM | POA: Diagnosis not present

## 2015-06-12 DIAGNOSIS — I10 Essential (primary) hypertension: Secondary | ICD-10-CM | POA: Insufficient documentation

## 2015-06-12 LAB — BASIC METABOLIC PANEL
ANION GAP: 11 (ref 5–15)
BUN: 18 mg/dL (ref 6–20)
CALCIUM: 10.1 mg/dL (ref 8.9–10.3)
CO2: 25 mmol/L (ref 22–32)
Chloride: 104 mmol/L (ref 101–111)
Creatinine, Ser: 1.16 mg/dL (ref 0.61–1.24)
GFR calc Af Amer: >60 mL/min (ref >60)
Glucose, Bld: 102 mg/dL — ABNORMAL HIGH (ref 65–99)
Potassium: 4.1 mmol/L (ref 3.5–5.1)
Sodium: 140 mmol/L (ref 135–145)

## 2015-06-12 LAB — CBC
HCT: 44.4 % (ref 39.0–52.0)
HEMOGLOBIN: 14.9 g/dL (ref 13.0–17.0)
MCH: 28 pg (ref 26.0–34.0)
MCHC: 33.6 g/dL (ref 30.0–36.0)
MCV: 83.3 fL (ref 78.0–100.0)
PLATELETS: 250 10*3/uL (ref 150–400)
RBC: 5.33 MIL/uL (ref 4.22–5.81)
RDW: 13.2 % (ref 11.5–15.5)
WBC: 7.7 10*3/uL (ref 4.0–10.5)

## 2015-06-12 LAB — I-STAT TROPONIN, ED
TROPONIN I, POC: 0 ng/mL (ref 0.00–0.08)
TROPONIN I, POC: 0 ng/mL (ref 0.00–0.08)
Troponin i, poc: 0 ng/mL (ref 0.00–0.08)

## 2015-06-12 MED ORDER — IOPAMIDOL (ISOVUE-370) INJECTION 76%
INTRAVENOUS | Status: AC
  Administered 2015-06-12: 100 mL
  Filled 2015-06-12: qty 100

## 2015-06-12 MED ORDER — DIAZEPAM 5 MG/ML IJ SOLN: 2.5000 mg | Freq: Once | INTRAMUSCULAR | Status: DC | PRN

## 2015-06-12 MED ORDER — GI COCKTAIL ~~LOC~~
30.0000 mL | Freq: Once | ORAL | Status: AC
  Administered 2015-06-12: 30 mL via ORAL
  Filled 2015-06-12: qty 30

## 2015-06-12 MED ORDER — SODIUM CHLORIDE 0.9 % IV BOLUS (SEPSIS): 1000.0000 mL | Freq: Once | INTRAVENOUS | Status: DC

## 2015-06-12 MED ORDER — NITROGLYCERIN 0.4 MG SL SUBL
0.4000 mg | SUBLINGUAL_TABLET | SUBLINGUAL | Status: DC | PRN
  Administered 2015-06-12 (×2): 0.4 mg via SUBLINGUAL
  Filled 2015-06-12: qty 1

## 2015-06-12 MED ORDER — GI COCKTAIL ~~LOC~~: 30.0000 mL | Freq: Once | ORAL | Status: DC

## 2015-06-12 MED ORDER — ASPIRIN 81 MG PO CHEW
324.0000 mg | CHEWABLE_TABLET | Freq: Once | ORAL | Status: AC
  Administered 2015-06-12: 324 mg via ORAL
  Filled 2015-06-12: qty 4

## 2015-06-12 NOTE — ED Notes (Signed)
Dr. Dalene SeltzerSchlossman aware pt diaphoretic, VSS, NAD. No further orders. Pt ok for d/c.

## 2015-06-12 NOTE — ED Provider Notes (Signed)
CSN: [id number]     Arrival date & time 06/12/15  0804 History   First MD Initiated Contact with Patient 06/12/15 [phone]     Chief Complaint  Patient presents with  . Chest Pain     (Consider location/radiation/quality/duration/timing/severity/associated sxs/prior Treatment) HPI Comments: 4AM started to have chest pain Worse with deep breaths Shortness of breath Dull pain, breath in it becomes sharp Left chest no radiation No nausea/vomiting Diaphoresis No hx of pain like this Mild wheezing  Htn, asthma, DVT taking xarelto as rx--started taking it in January. Missed 1 dose this week. Wed night, Friday morning didn't have dose No fam hx heart disease No smoking, no other drugs, denies possibility of withdrawal as cause of  Fam hx of blood clots in grandma and cousins  Patient is a 35 y.o. male presenting with chest pain.  Chest Pain Associated symptoms: diaphoresis and shortness of breath   Associated symptoms: no abdominal pain, no back pain, no cough, no fever, no headache, no nausea and not vomiting     Past Medical History  Diagnosis Date  . Asthma   . ADHD (attention deficit hyperactivity disorder)   . Hypertension    History reviewed. No pertinent past surgical history. History reviewed. No pertinent family history. Social History  Substance Use Topics  . Smoking status: Never Smoker   . Smokeless tobacco: None  . Alcohol Use: Yes     Comment: rarely    Review of Systems  Constitutional: Positive for diaphoresis. Negative for fever.  HENT: Negative for sore throat.   Eyes: Negative for visual disturbance.  Respiratory: Positive for shortness of breath. Negative for cough.   Cardiovascular: Positive for chest pain. Negative for leg swelling.  Gastrointestinal: Negative for nausea, vomiting, abdominal pain and diarrhea.  Genitourinary: Negative for difficulty urinating.  Musculoskeletal: Negative for back pain and neck stiffness.  Skin: Negative for rash.   Neurological: Positive for light-headedness. Negative for syncope and headaches.      Allergies  Review of patient's allergies indicates no known allergies.  Home Medications   Prior to Admission medications   Medication Sig Start Date End Date Taking? Authorizing Provider  amphetamine-dextroamphetamine (ADDERALL XR) 20 MG 24 hr capsule Take 20 mg by mouth daily.   Yes Historical Provider, MD  losartan-hydrochlorothiazide (HYZAAR) 100-25 MG tablet Take 1 tablet by mouth daily. 06/05/15  Yes Historical Provider, MD  XARELTO 20 MG TABS tablet Take 20 mg by mouth daily. 06/05/15  Yes Historical Provider, MD  diclofenac (CATAFLAM) 50 MG tablet Take 1 tablet (50 mg total) by mouth 3 (three) times daily. Patient not taking: Reported on 06/12/2015 06/16/14   Linna HoffJames D Kindl, MD  sulfamethoxazole-trimethoprim (BACTRIM DS) 800-160 MG per tablet Take 1 tablet by mouth 2 (two) times daily. Patient not taking: Reported on 06/12/2015 05/09/13   Irish EldersKelly Walker, NP   BP 135/88 mmHg  Pulse 85  Temp(Src) 98.7 F (37.1 C) (Oral)  Resp 15  Ht 5\' 10"  (1.778 m)  SpO2 97% Physical Exam  Constitutional: He is oriented to person, place, and time. He appears well-developed and well-nourished. No distress.  HENT:  Head: Normocephalic and atraumatic.  Eyes: Conjunctivae and EOM are normal.  Neck: Normal range of motion.  Cardiovascular: Normal rate, regular rhythm, normal heart sounds and intact distal pulses.  Exam reveals no gallop and no friction rub.   No murmur heard. Pulmonary/Chest: Effort normal and breath sounds normal. No respiratory distress. He has no wheezes. He has no rales. He exhibits  no tenderness.  Abdominal: Soft. He exhibits no distension. There is no tenderness. There is no guarding.  Musculoskeletal: He exhibits no edema.  Neurological: He is alert and oriented to person, place, and time.  Skin: Skin is warm. He is diaphoretic.  Nursing note and vitals reviewed.   ED Course  Procedures  (including critical care time) Labs Review Labs Reviewed  BASIC METABOLIC PANEL - Abnormal; Notable for the following:    Glucose, Bld 102 (*)    All other components within normal limits  CBC  I-STAT TROPOININ, ED  I-STAT TROPOININ, ED  Rosezena Sensor, ED    Imaging Review Dg Chest 2 View  06/12/2015  CLINICAL DATA:  Shortness of breath and chest pain for 1 day EXAM: CHEST  2 VIEW COMPARISON:  June 16, 2014 FINDINGS: Lungs are clear. Heart size and pulmonary vascularity are normal. No adenopathy. There is mild upper thoracic levoscoliosis. No pneumothorax. IMPRESSION: No edema or consolidation. Electronically Signed   By: Bretta Bang III M.D.   On: 06/12/2015 08:30   Ct Angio Chest Pe W/cm &/or Wo Cm  06/12/2015  CLINICAL DATA:  Left side chest pain starting this morning 4 a.m., left leg DVT EXAM: CT ANGIOGRAPHY CHEST WITH CONTRAST TECHNIQUE: Multidetector CT imaging of the chest was performed using the standard protocol during bolus administration of intravenous contrast. Multiplanar CT image reconstructions and MIPs were obtained to evaluate the vascular anatomy. CONTRAST:  100 cc Isovue COMPARISON:  None. FINDINGS: Mediastinum/Lymph Nodes: Images of the thoracic inlet are unremarkable. Central airways are patent. No mediastinal hematoma or adenopathy. Heart size within normal limits. No pericardial effusion. No central pulmonary embolus is noted. Suboptimal assessment of segmental arterial branches due to poor bolus contrast opacification. There is no hilar adenopathy. Lungs/Pleura: No pleural thickening or pleural effusion. Images of the lung parenchyma shows no acute infiltrate or pulmonary edema. Upper abdomen: Visualized upper abdomen is unremarkable. Musculoskeletal: No destructive rib lesions are noted. Sagittal images of the spine are unremarkable. Review of the MIP images confirms the above findings. IMPRESSION: 1. No central pulmonary embolus. 2. No acute infiltrate or pulmonary  edema. 3. No mediastinal hematoma or adenopathy. 4. No pleural or pericardial effusion. Electronically Signed   By: Natasha Mead M.D.   On: 06/12/2015 10:41   I have personally reviewed and evaluated these images and lab results as part of my medical decision-making.   EKG Interpretation   Date/Time:  Tuesday June 12 2015 14:19:26 EDT Ventricular Rate:  73 PR Interval:  151 QRS Duration: 97 QT Interval:  369 QTC Calculation: 407 R Axis:   74 Text Interpretation:  Sinus rhythm Since prior ECG, rate has slowed  Confirmed by [facility] MD, Alean Kromer ([id number]) on 06/12/2015 3:17:36 PM      MDM   Final diagnoses:  Chest pain, unspecified chest pain type  Shortness of breath   35 year old male with history of unprovoked DVT, hypertension presents with concern of chest pain and shortness of breath. Differential diagnosis for chest pain includes pulmonary embolus, dissection, pneumothorax, pneumonia, ACS, myocarditis, pericarditis.  EKG was done and evaluate by me and showed no acute ST changes and no signs of pericarditis. Chest x-ray was done and evaluated by me and radiology and showed no sign of pneumonia or pneumothorax.  CT PE study was done which showed no evidence of central pulmonary embolus, pneumonia, pleural or pericardial effusion.  Patient is low risk HEART score and had delta troponins x3 which were all negative. Given this evaluation,  history and physical have low suspicion for pulmonary embolus, pneumonia, ACS, myocarditis, pericarditis, dissection.  Discussed possible admission with hospitalist Dr. Konrad Dolores given presence of continued diaphoresis and pleuritic pain who reports they would be happy to consult on the patient, however based on history I provided and work up so far, feel he is appropriate for outpatient evaluation. Discussed with patient and offered consult for admission, however he feels comfortable with outpatient evaluation with PCP.  Diaphoresis may be secondary to pain,  and on reevaluation prior to discharge both symptoms of CP and diaphoresis have improved.  Patient does not have any exertional symptoms, and is low risk for cardiac disease.  Pleuritic pain may be viral pleurisy, similar to prior episode in April 2016.  Discussed need for close follow up with PCP, consideration of work up for other disorders, hypercoaguable state.  Patient discharged in stable condition with understanding of reasons to return.     Alvira Monday, MD 06/12/15 [phone]

## 2015-06-12 NOTE — ED Notes (Signed)
Pt here with severe left sided chest pain that started this am. sts suddenly at work. Pt diaphoretic in acute distress. Pt being treated for DVT in leg. sts hard to get a breath.

## 2015-06-12 NOTE — ED Notes (Signed)
Pt  States having sharp pain in left arm from forearm to wrist stabbing pain,

## 2015-06-12 NOTE — ED Notes (Signed)
To ct

## 2015-07-06 ENCOUNTER — Ambulatory Visit (INDEPENDENT_AMBULATORY_CARE_PROVIDER_SITE_OTHER): Payer: 59 | Admitting: Medical

## 2015-07-06 ENCOUNTER — Encounter: Payer: Self-pay | Admitting: Medical

## 2015-07-06 VITALS — BP 130/88 | HR 77 | Resp 16 | Ht 69.0 in | Wt 276.8 lb

## 2015-07-06 DIAGNOSIS — F909 Attention-deficit hyperactivity disorder, unspecified type: Secondary | ICD-10-CM | POA: Diagnosis not present

## 2015-07-06 DIAGNOSIS — Z79899 Other long term (current) drug therapy: Secondary | ICD-10-CM | POA: Diagnosis not present

## 2015-07-06 DIAGNOSIS — H5213 Myopia, bilateral: Secondary | ICD-10-CM | POA: Diagnosis not present

## 2015-07-06 DIAGNOSIS — Z832 Family history of diseases of the blood and blood-forming organs and certain disorders involving the immune mechanism: Secondary | ICD-10-CM | POA: Diagnosis not present

## 2015-07-06 DIAGNOSIS — E669 Obesity, unspecified: Secondary | ICD-10-CM

## 2015-07-06 DIAGNOSIS — I10 Essential (primary) hypertension: Secondary | ICD-10-CM | POA: Diagnosis not present

## 2015-07-06 DIAGNOSIS — Z86718 Personal history of other venous thrombosis and embolism: Secondary | ICD-10-CM | POA: Diagnosis not present

## 2015-07-06 MED ORDER — LOSARTAN POTASSIUM-HCTZ 100-25 MG PO TABS: 1.0000 | ORAL_TABLET | Freq: Every day | ORAL | Status: DC

## 2015-07-06 MED ORDER — XARELTO 20 MG PO TABS: 20.0000 mg | ORAL_TABLET | Freq: Every day | ORAL | Status: DC

## 2015-07-06 MED FILL — LOSARTAN-HCTZ 100-25 MG TAB: 100-25 | 90 days supply | Qty: 90 | Fill #0

## 2015-07-06 MED FILL — XARELTO 20 MG TABLET: 20 | 90 days supply | Qty: 90 | Fill #0

## 2015-07-06 NOTE — Progress Notes (Signed)
Subjective: Chief Complaint  Patient presents with  . new pt to get established   Here to establish care.  Was in hospital few weeks ago, emergency dept.  Went for chest pain, SOB, sweating, was evaluated in the ED.  In January was diagnosed with DVT.  Felt tight every time he breathed in.  Was advised to f/u with PCP.   He wasn't satisfied with prior PCP.     He notes no other blood clot prior to 03/2015.   Does have significant maternal side family hx/o blood clots.   No prior lab panel to evaluate for blood clot.   He notes that they weren't sure why he got clot back in 03/2015.    Past Medical History  Diagnosis Date  . Asthma   . ADHD (attention deficit hyperactivity disorder)     diagnosed 2012 by psychiatry, [facility]  . Hypertension   . Allergy   . Migraine   . Wears glasses   . GERD (gastroesophageal reflux disease)   . Anxiety   . Gastric ulcer   . Low testosterone     Past Surgical History  Procedure Laterality Date  . Esophagogastroduodenoscopy  2007  . Colonoscopy  2007    Social History   Social History  . Marital Status: Married    Spouse Name: N/A  . Number of Children: N/A  . Years of Education: N/A   Occupational History  . Not on file.   Social History Main Topics  . Smoking status: Never Smoker   . Smokeless tobacco: Not on file  . Alcohol Use: Yes     Comment: rarely  . Drug Use: No  . Sexual Activity: Not on file   Other Topics Concern  . Not on file   Social History Narrative   Married, has 4 sons    Family History  Problem Relation Age of Onset  . Asthma Mother   . Ulcers Father   . Clotting disorder Maternal Uncle   . Cancer Paternal Aunt   . Cancer Paternal Grandmother   . Clotting disorder Paternal Grandmother   . Heart disease Neg Hx   . Stroke Neg Hx   . Clotting disorder Cousin      Current outpatient prescriptions:  .  amphetamine-dextroamphetamine (ADDERALL XR) 20 MG 24 hr capsule, Take 20 mg by mouth  daily., Disp: , Rfl:  .  losartan-hydrochlorothiazide (HYZAAR) 100-25 MG tablet, Take 1 tablet by mouth daily., Disp: 90 tablet, Rfl: 0 .  XARELTO 20 MG TABS tablet, Take 1 tablet (20 mg total) by mouth daily., Disp: 90 tablet, Rfl: 0  No Known Allergies   Objective: BP 130/88 mmHg  Pulse 77  Resp 16  Ht  (1.753 m)  Wt 276 lb 12.8 oz (125.556 kg)  BMI 40.86 kg/m2  General appearance: alert, no distress, WD/WN, AA male Neck: supple, no lymphadenopathy, no thyromegaly, no masses Heart: RRR, normal S1, S2, no murmurs Lungs: CTA bilaterally, no wheezes, rhonchi, or rales Musculoskeletal: nontender, no swelling, no obvious deformity Extremities: no edema, no cyanosis, no clubbing Pulses: 2+ symmetric, upper and lower extremities, normal cap refill    Assessment: Encounter Diagnoses  Name Primary?  . History of DVT (deep vein thrombosis) Yes  . Family history of clotting disorder   . Essential hypertension   . Obesity   . Attention deficit hyperactivity disorder (ADHD), unspecified ADHD type   . High risk medication use      Plan: reviewed recent ED report,  labs, CT chest, CXR.   Based on history and spontaneous DVT in 03/2015, he likely has underlying blood clotting disorder.   Labs today.  Records request today from prior PCP.  Consider changing off stimulant. C/t BP medication and Xarelto.  Consider echo or cardiology consult given recent chest pains.   Harvie HeckRandy was seen today for new pt to get established.  Diagnoses and all orders for this visit:  History of DVT (deep vein thrombosis) -     Hypercoagulable panel, comprehensive -     Factor 5 leiden  Family history of clotting disorder -     Hypercoagulable panel, comprehensive -     Factor 5 leiden  Essential hypertension -     Hypercoagulable panel, comprehensive -     Factor 5 leiden  Obesity -     Hypercoagulable panel, comprehensive -     Factor 5 leiden  Attention deficit hyperactivity disorder  (ADHD), unspecified ADHD type -     Hypercoagulable panel, comprehensive -     Factor 5 leiden  High risk medication use -     Hypercoagulable panel, comprehensive -     Factor 5 leiden  Other orders -     losartan-hydrochlorothiazide (HYZAAR) 100-25 MG tablet; Take 1 tablet by mouth daily. -     XARELTO 20 MG TABS tablet; Take 1 tablet (20 mg total) by mouth daily.

## 2015-07-09 ENCOUNTER — Telehealth: Payer: Self-pay | Admitting: Medical

## 2015-07-09 NOTE — Telephone Encounter (Signed)
Records received from [facility]. Sending back for review. Please note what needs to be scanned/abstracted or just filed in paper chart.

## 2015-07-10 LAB — RFX PTT-LA W/RFX TO HEX PHASE CONF: PTT-LA Screen: 46 s — ABNORMAL HIGH (ref <=40)

## 2015-07-10 LAB — RFLX HEXAGONAL PHASE CONFIRM: Hexagonal Phase Confirm: NEGATIVE

## 2015-07-10 LAB — RFX DRVVT SCR W/RFLX CONF 1:1 MIX: DRVVT SCREEN: 40 s (ref <=45)

## 2015-07-11 ENCOUNTER — Other Ambulatory Visit: Payer: Self-pay | Admitting: Medical

## 2015-07-12 LAB — HYPERCOAGULABLE PANEL, COMPREHENSIVE
AntiThromb III Func: 123 % activity — ABNORMAL HIGH (ref 80–120)
Anticardiolipin IgA: <11 [APL'U]
Anticardiolipin IgG: <14 [GPL'U]
Beta-2 Glyco I IgG: <9 SGU (ref <=20)
PROTEIN S ANTIGEN, TOTAL: 95 % (ref 70–140)
Protein C Antigen: 118 % (ref 70–140)
Protein S Activity: 104 % (ref 70–150)

## 2015-07-13 ENCOUNTER — Other Ambulatory Visit: Payer: Self-pay | Admitting: *Deleted

## 2015-07-13 DIAGNOSIS — R079 Chest pain, unspecified: Secondary | ICD-10-CM

## 2015-07-13 DIAGNOSIS — Z832 Family history of diseases of the blood and blood-forming organs and certain disorders involving the immune mechanism: Secondary | ICD-10-CM

## 2015-07-13 DIAGNOSIS — Z86718 Personal history of other venous thrombosis and embolism: Secondary | ICD-10-CM

## 2015-07-18 ENCOUNTER — Encounter: Payer: Self-pay | Admitting: Oncology

## 2015-07-18 ENCOUNTER — Encounter: Payer: Self-pay | Admitting: Medical

## 2015-07-19 ENCOUNTER — Other Ambulatory Visit: Payer: Self-pay | Admitting: Medical

## 2015-07-19 MED ORDER — AMPHETAMINE-DEXTROAMPHET ER 20 MG PO CP24: 20.0000 mg | ORAL_CAPSULE | Freq: Every day | ORAL | Status: DC

## 2015-07-20 MED FILL — DEXTROAMP-AMPHET ER 20 MG C: 20 | 30 days supply | Qty: 30 | Fill #0

## 2015-07-24 ENCOUNTER — Encounter: Payer: Self-pay | Admitting: Internal Medicine

## 2015-08-02 ENCOUNTER — Telehealth: Payer: Self-pay | Admitting: Oncology

## 2015-08-02 NOTE — Telephone Encounter (Signed)
Joel Wallace with [name] left a voicemail stating that the patient couldn't make the appointment scheduled for him on 5/19. I called the patient to reschedule, no answer. Left voicemail for the patient to call back.

## 2015-08-03 ENCOUNTER — Ambulatory Visit: Payer: 59 | Admitting: Oncology

## 2015-09-07 ENCOUNTER — Ambulatory Visit (INDEPENDENT_AMBULATORY_CARE_PROVIDER_SITE_OTHER): Payer: 59 | Admitting: Family Medicine

## 2015-09-07 ENCOUNTER — Ambulatory Visit
Admission: RE | Admit: 2015-09-07 | Discharge: 2015-09-07 | Disposition: A | Discharge status: Home / Self Care | Payer: 59 | Source: Ambulatory Visit | Attending: Family Medicine | Admitting: Family Medicine

## 2015-09-07 ENCOUNTER — Encounter: Payer: Self-pay | Admitting: Family Medicine

## 2015-09-07 VITALS — BP 112/80 | HR 78 | Wt 281.6 lb

## 2015-09-07 DIAGNOSIS — Z86718 Personal history of other venous thrombosis and embolism: Secondary | ICD-10-CM

## 2015-09-07 DIAGNOSIS — M25562 Pain in left knee: Secondary | ICD-10-CM

## 2015-09-07 DIAGNOSIS — M179 Osteoarthritis of knee, unspecified: Secondary | ICD-10-CM | POA: Diagnosis not present

## 2015-09-07 NOTE — Progress Notes (Signed)
   Subjective:    Patient ID: Joel Wallace, male    DOB: 07/24/1980, 35 y.o.   MRN: [id number]  HPI Here for evaluation of left knee pain. He has had difficulty with this for the last several years. He did have an MRI done in 2015 and subsequent to that did have an injection into the knee. The MRI did show some degenerative changes He states that the injection only lasted about 4 weeks. Since then he has had difficulty with popping and grinding sensation. He has been using ibuprofen to 3 times per week. He does have history of DVT and presently is on Xarelto.   Review of Systems     Objective:   Physical Exam Left knee exam shows no effusion. Anterior drawer negative. McMurray's testing negative. Medial and lateral collateral ligaments intact.       Assessment & Plan:  History of DVT (deep vein thrombosis)  Left knee pain With his history of DVT, recommend he switch to taking Tylenol regularly for pain relief and if that doesn't work then potentially use Aleve. Explained that the Aleve and Xarelto are not good to use together. He is aware of this. Explained the fact that he does have some degenerative changes and were trying to give him as much relief as possible to postpone any potential surgical interventions and or possible even knee replacement. He is obviously too young to consider that at this point. Also discussed the possibility of using a different material forwarded injection to help with joint lubrication but again explained that this is temporary.

## 2015-09-07 NOTE — Patient Instructions (Signed)
Switch to taking as much as 2 Aleve twice per day. Before you even take Aleve try Tylenol.

## 2015-09-27 ENCOUNTER — Encounter (HOSPITAL_COMMUNITY): Payer: Self-pay | Admitting: Emergency Medicine

## 2015-09-27 ENCOUNTER — Ambulatory Visit (HOSPITAL_COMMUNITY)
Admission: EM | Admit: 2015-09-27 | Discharge: 2015-09-27 | Disposition: A | Discharge status: Home / Self Care | Payer: 59 | Attending: Family Medicine | Admitting: Family Medicine

## 2015-09-27 ENCOUNTER — Ambulatory Visit (INDEPENDENT_AMBULATORY_CARE_PROVIDER_SITE_OTHER): Payer: 59

## 2015-09-27 DIAGNOSIS — S4991XA Unspecified injury of right shoulder and upper arm, initial encounter: Secondary | ICD-10-CM

## 2015-09-27 DIAGNOSIS — M25511 Pain in right shoulder: Secondary | ICD-10-CM | POA: Diagnosis not present

## 2015-09-27 MED ORDER — TRAMADOL HCL 50 MG PO TABS: 50.0000 mg | ORAL_TABLET | Freq: Four times a day (QID) | ORAL | Status: DC | PRN

## 2015-09-27 MED FILL — traMADol HCL 50 MG TABS: 50 | 4 days supply | Qty: 15 | Fill #0

## 2015-09-27 NOTE — ED Provider Notes (Signed)
CSN: [id number]     Arrival date & time 09/27/15  [id number] History   First MD Initiated Contact with Patient 09/27/15 1014     Chief Complaint  Patient presents with  . Shoulder Injury   (Consider location/radiation/quality/duration/timing/severity/associated sxs/prior Treatment) HPI Marcie BalRandy D General is a 35 y.o. 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  Pt fell directly to the side onto his shoulder. Pain is aching and sharp, 10/10, worse with movement. Limited ROM due to pain.  No prior shoulder surgeries or injuries. No other injuries. He is Left hand dominant.   Past Medical History  Diagnosis Date  . Asthma   . ADHD (attention deficit hyperactivity disorder)     [facility], [facility]  . Hypertension   . Allergy   . Migraine   . Wears glasses   . GERD (gastroesophageal reflux disease)   . Anxiety   . Gastric ulcer   . Low testosterone    Past Surgical History  Procedure Laterality Date  . Esophagogastroduodenoscopy  2007  . Colonoscopy  2007   Family History  Problem Relation Age of Onset  . Asthma Mother   . Ulcers Father   . Clotting disorder Maternal Uncle   . Cancer Paternal Aunt   . Cancer Paternal Grandmother   . Clotting disorder Paternal Grandmother   . Heart disease Neg Hx   . Stroke Neg Hx   . Clotting disorder Cousin    Social History  Substance Use Topics  . Smoking status: Never Smoker   . Smokeless tobacco: None  . Alcohol Use: Yes     Comment: rarely    Review of Systems  Musculoskeletal: Positive for myalgias and arthralgias. Negative for joint swelling, neck pain and neck stiffness.  Skin: Negative for color change and wound.  Neurological: Positive for weakness and numbness.    Allergies  Review of patient's allergies indicates no known allergies.  Home Medications   Prior to Admission  medications   Medication Sig Start Date End Date Taking? Authorizing Provider  losartan-hydrochlorothiazide (HYZAAR) 100-25 MG tablet Take 1 tablet by mouth daily. 07/06/15  Yes David S Tysinger, PA-C  XARELTO 20 MG TABS tablet Take 1 tablet (20 mg total) by mouth daily. 07/06/15  Yes Kermit Baloavid S Tysinger, PA-C  amphetamine-dextroamphetamine (ADDERALL XR) 20 MG 24 hr capsule Take 1 capsule (20 mg total) by mouth daily. Patient not taking: Reported on 09/07/2015 07/19/15   Kermit Baloavid S Tysinger, PA-C  traMADol (ULTRAM) 50 MG tablet Take 1 tablet (50 mg total) by mouth every 6 (six) hours as needed. 09/27/15   Junius FinnerErin O'Malley, PA-C   Meds Ordered and Administered this Visit  Medications - No data to display  BP 137/81 mmHg  Pulse 100  Temp(Src) 98.1 F (36.7 C) (Oral)  Resp 20  SpO2 98% No data found.   Physical Exam  Constitutional: He is oriented to person, place, and time. He appears well-developed and well-nourished.  HENT:  Head: Normocephalic and atraumatic.  Eyes: EOM are normal.  Neck: Normal range of motion. Neck supple.  No midline bone tenderness, no crepitus or step-offs.   Cardiovascular: Normal rate.   Pulmonary/Chest: Effort normal.  Musculoskeletal: He exhibits tenderness. He exhibits no edema.  Right shoulder: no obvious deformity. Tenderness to posterior and superior aspect. Limited ROM. Decreased abduction, difficulty touching opposite shoulder with Right hand. Limited abduction, flexion and extension.  Full ROM Right elbow w/o tenderness. 4/5 strength in Right arm compared to Left.  Neurological: He is alert and oriented to person, place, and time.  Altered sensation in fingers 3-5 on Right hand compared to 1st and 2nd finger on same hand.   Skin: Skin is warm and dry. No erythema.  Psychiatric: He has a normal mood and affect. His behavior is normal.  Nursing note and vitals reviewed.   ED Course  Procedures (including critical care time)  Labs Review Labs Reviewed - No  data to display  Imaging Review Dg Shoulder Right  09/27/2015  CLINICAL DATA:  Larey Seat yesterday at football practice with right shoulder injury. Initial encounter. EXAM: RIGHT SHOULDER - 2+ VIEW COMPARISON:  None. FINDINGS: There is no evidence of fracture or dislocation. There is no evidence of arthropathy or other focal bone abnormality. Soft tissues are unremarkable. IMPRESSION: Negative. Electronically Signed   By: Kennith Center M.D.   On: 09/27/2015 10:46     MDM   1. Right shoulder injury, initial encounter   2. Right shoulder pain    Injury to Right shoulder with pain and decreased ROM. Mild numbness in fingers 3-5 on Right hand. Pulse in tact.  Plain films: Negative for fracture or dislocation. Will place pt in sling for comfort. Possible rotator cuff tear.  Pt info with home exercises for ROM provided. Encouraged to perform 2-3 times daily. No heavily lifting with Right arm for 1 week  Rx: tramadol, no NSAIDs as pt is on Xarelto  F/u with orthopedist in 1-2 weeks if not improving. Patient verbalized understanding and agreement with treatment plan.     Junius Finner, PA-C 09/27/15 1137

## 2015-09-27 NOTE — Discharge Instructions (Signed)
°  Tramadol is strong pain medication. While taking, do not drink alcohol, drive, or perform any other activities that requires focus while taking these medications.   You may wear the arm sling for comfort but be sure to take it off periodically throughout the day to perform the shoulder range of motion exercises.  Avoid heavy lifting with Right arm for at least 1 week.

## 2015-09-27 NOTE — ED Notes (Signed)
[facility] a [facility] a right shoulder injury that occurred last night secondary to a fall at football practice.

## 2015-12-17 ENCOUNTER — Ambulatory Visit (INDEPENDENT_AMBULATORY_CARE_PROVIDER_SITE_OTHER): Payer: 59 | Admitting: Medical

## 2015-12-17 ENCOUNTER — Encounter: Payer: Self-pay | Admitting: Medical

## 2015-12-17 VITALS — BP 134/90 | HR 90 | Ht 69.0 in | Wt 285.0 lb

## 2015-12-17 DIAGNOSIS — E669 Obesity, unspecified: Secondary | ICD-10-CM | POA: Insufficient documentation

## 2015-12-17 DIAGNOSIS — R7301 Impaired fasting glucose: Secondary | ICD-10-CM | POA: Diagnosis not present

## 2015-12-17 DIAGNOSIS — Z86718 Personal history of other venous thrombosis and embolism: Secondary | ICD-10-CM

## 2015-12-17 DIAGNOSIS — I10 Essential (primary) hypertension: Secondary | ICD-10-CM | POA: Insufficient documentation

## 2015-12-17 DIAGNOSIS — E291 Testicular hypofunction: Secondary | ICD-10-CM | POA: Diagnosis not present

## 2015-12-17 DIAGNOSIS — R7989 Other specified abnormal findings of blood chemistry: Secondary | ICD-10-CM | POA: Insufficient documentation

## 2015-12-17 DIAGNOSIS — Z79899 Other long term (current) drug therapy: Secondary | ICD-10-CM | POA: Insufficient documentation

## 2015-12-17 HISTORY — DX: Personal history of other venous thrombosis and embolism: Z86.718

## 2015-12-17 LAB — TSH: TSH: 2.17 m[IU]/L (ref 0.40–4.50)

## 2015-12-17 NOTE — Progress Notes (Signed)
Subjective: Chief Complaint  Patient presents with  . Follow-up    pt had recent labs which gave an A1c result of 5.7    Here for f/u.   He notes health assessment done at his house recently.  He is part of a health program from [employer] health and Nutrition Examination Survey.  Has been involved with this program since his early 60s.    Here for concern about diabetes.   With the healthy assessment, had HgbA1C of 5.7% from 11/2015.  Micro albumin ration 2.34 at that time.    He is a nonsmoker.    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  Monday - Thursday is active.  He notes cutting out greasy foods, using no oil to keep, eating more [place], less beef.   He is worried that his weight is ballooning.  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    Has hx/o low testosterone.   Last used TST gel in over a year.  originally had this tested in his 76s due to low energy.  He does note weight gain this past year since stopping testosterone.   No  Hx/o illegal testosterone use.    Wants advise on losing weight.     Past Medical History:  Diagnosis Date  . ADHD (attention deficit hyperactivity disorder)    diagnosed 2012 by psychiatry, [facility]  . Allergy   . Anxiety   . Asthma   . Gastric ulcer   . GERD (gastroesophageal reflux disease)   . Hypertension   . Low testosterone   . Migraine   . Wears glasses    Current Outpatient Prescriptions on File Prior to Visit  Medication Sig Dispense Refill  . amphetamine-dextroamphetamine (ADDERALL XR) 20 MG 24 hr capsule Take 1 capsule (20 mg total) by mouth daily. 30 capsule 0  . losartan-hydrochlorothiazide (HYZAAR) 100-25 MG tablet Take 1 tablet by mouth daily. 90 tablet 0  . traMADol (ULTRAM) 50 MG tablet Take 1 tablet (50 mg total) by mouth every 6 (six) hours as needed. 15 tablet 0  . XARELTO 20 MG TABS tablet Take 1 tablet (20 mg total) by mouth daily. 90 tablet 0   No current facility-administered medications on  file prior to visit.    ROS as in subjective   Objective BP 134/90   Pulse 90   Ht 5\' 9"  (1.753 m)   Wt 285 lb (129.3 kg)   SpO2 98%   BMI 42.09 kg/m   BP Readings from Last 3 Encounters:  12/17/15 134/90  09/27/15 137/81  09/07/15 112/80   Wt Readings from Last 3 Encounters:  12/17/15 285 lb (129.3 kg)  09/07/15 281 lb 9.6 oz (127.7 kg)  07/06/15 276 lb 12.8 oz (125.6 kg)   Weight from 07/2011 was 281 lb.   General appearance: alert, no distress, WD/WN, obese AA male Neck: supple, no lymphadenopathy, no thyromegaly, no masses Heart: RRR, normal S1, S2, no murmurs Lungs: CTA bilaterally, no wheezes, rhonchi, or rales Abdomen: +bs, soft, non tender, non distended, no masses, no hepatomegaly, no splenomegaly Pulses: 2+ symmetric, upper and lower extremities, normal cap refill Ext: no edema   Assessment: Encounter Diagnoses  Name Primary?  . Impaired fasting blood sugar Yes  . Low testosterone in male   . Obesity with serious comorbidity, unspecified classification, unspecified obesity type   . Essential hypertension   . High risk medication use   . History of DVT (deep vein thrombosis)  Plan: impaired fasting glucose - discussed his lab findings, at risk for diabetes.   Discussed diet, exercise at great length.   Gave strategies and goals for this.     Hx/o low testosterone - labs today, consider restarting therapy  Obesity - work on lifestyle changes, consider weight loss medication such as Saxenda  HTN - c/t same medication  Hx/o DVT - he never saw hematology back in spring, advised he reconsider consult with hematlogy.  He current is not taking xarelto.  We discussed risks of not using the medication, potential for repeat DVT.   Harvie HeckRandy was seen today for follow-up.  Diagnoses and all orders for this visit:  Impaired fasting blood sugar -     TSH  Low testosterone in male -     TSH -     Testosterone -     PSA  Obesity with serious comorbidity,  unspecified classification, unspecified obesity type -     TSH  Essential hypertension -     TSH  High risk medication use -     TSH -     Testosterone -     PSA  History of DVT (deep vein thrombosis)

## 2015-12-18 ENCOUNTER — Encounter: Payer: Self-pay | Admitting: *Deleted

## 2015-12-18 ENCOUNTER — Other Ambulatory Visit: Payer: Self-pay | Admitting: Medical

## 2015-12-18 LAB — TESTOSTERONE: Testosterone: 235 ng/dL — ABNORMAL LOW (ref 250–827)

## 2015-12-18 LAB — PSA: PSA: 0.3 ng/mL (ref <=4.0)

## 2015-12-18 MED ORDER — XARELTO 20 MG PO TABS: 20.0000 mg | ORAL_TABLET | Freq: Every day | ORAL | 3 refills | Status: DC

## 2015-12-18 MED ORDER — LOSARTAN POTASSIUM-HCTZ 100-25 MG PO TABS: 1.0000 | ORAL_TABLET | Freq: Every day | ORAL | 3 refills | Status: DC

## 2015-12-18 MED FILL — XARELTO 20 MG TABLET: 20 | 90 days supply | Qty: 90 | Fill #0

## 2015-12-18 MED FILL — LOSARTAN-HCTZ 100-25 MG TAB: 100-25 | 90 days supply | Qty: 90 | Fill #0

## 2015-12-18 NOTE — Progress Notes (Signed)
Left vm & sent MyChart message

## 2015-12-20 ENCOUNTER — Other Ambulatory Visit: Payer: Self-pay | Admitting: Medical

## 2015-12-20 MED ORDER — TESTOSTERONE 5.5 MG/ACT NA GEL: 1.0000 | Freq: Three times a day (TID) | NASAL | 2 refills | Status: DC

## 2015-12-20 MED ORDER — NALTREXONE-BUPROPION HCL ER 8-90 MG PO TB12: 2.0000 | ORAL_TABLET | Freq: Two times a day (BID) | ORAL | 1 refills | Status: DC

## 2015-12-21 ENCOUNTER — Encounter: Payer: Self-pay | Admitting: Medical

## 2015-12-22 ENCOUNTER — Telehealth: Payer: Self-pay | Admitting: Medical

## 2015-12-22 NOTE — Telephone Encounter (Signed)
P.A. CONTRAVE  

## 2015-12-24 ENCOUNTER — Telehealth: Payer: Self-pay | Admitting: Medical

## 2015-12-24 NOTE — Telephone Encounter (Signed)
Vernona RiegerLaura  Please check on prior authorizations for Contrave and Natesto.  I sent both to pharmacy.  He notes that insurer told him to await prior auth.  To my knowledge, he hasn't been on other weight loss medications ,but has used testosteone gels and shots in the past.  Vincenza HewsShane

## 2015-12-31 ENCOUNTER — Telehealth: Payer: Self-pay | Admitting: Medical

## 2015-12-31 NOTE — Telephone Encounter (Signed)
Called pt & he states he tried and failed Androgel in the past.  Also concern for topical application and his small kids. Initiated P.A. NATESTO

## 2015-12-31 NOTE — Telephone Encounter (Signed)
See telephone calls, P.A. initiated on both meds.

## 2016-01-06 NOTE — Telephone Encounter (Signed)
Response from ins states no patient found will call ins company Monday  

## 2016-01-06 NOTE — Telephone Encounter (Signed)
Response from ins states no patient found will call ins company Monday

## 2016-01-08 ENCOUNTER — Encounter: Payer: Self-pay | Admitting: Medical

## 2016-01-09 ENCOUNTER — Telehealth: Payer: Self-pay | Admitting: Medical

## 2016-01-09 MED FILL — NATESTO NASAL 5.5 MG/0.122: 5.5 | 60 days supply | Qty: 15 | Fill #0

## 2016-01-09 MED FILL — CONTRAVE ER 8-90 MG TABLET: 8-90 | 30 days supply | Qty: 120 | Fill #0

## 2016-01-09 NOTE — Telephone Encounter (Signed)
P.A. Approved til 05/11/16 Ref# [id number], pt informed

## 2016-01-09 NOTE — Telephone Encounter (Signed)
Pt informed

## 2016-01-09 NOTE — Telephone Encounter (Signed)
Called [phone] & ID # was missing 01 so I completed P.A over the phone

## 2016-01-09 NOTE — Telephone Encounter (Signed)
Both have been approved and pt informed

## 2016-01-09 NOTE — Telephone Encounter (Signed)
Patient emailed me to hear about status of prior [place] on [place] and Natesto.   Do you have an update?  Do I need to do anything else at this time?

## 2016-01-09 NOTE — Telephone Encounter (Signed)
Called [phone] & ID # was missing 01 so I completed P.A over the phone and this was approved til 07/09/16  reff# [id number]

## 2016-01-10 ENCOUNTER — Encounter: Payer: Self-pay | Admitting: Medical

## 2016-01-10 ENCOUNTER — Other Ambulatory Visit: Payer: Self-pay

## 2016-01-10 MED ORDER — TESTOSTERONE 5.5 MG/ACT NA GEL: 1.0000 | Freq: Three times a day (TID) | NASAL | 2 refills | Status: DC

## 2016-01-23 ENCOUNTER — Ambulatory Visit (INDEPENDENT_AMBULATORY_CARE_PROVIDER_SITE_OTHER): Payer: 59 | Admitting: Medical

## 2016-01-23 ENCOUNTER — Encounter: Payer: Self-pay | Admitting: Medical

## 2016-01-23 ENCOUNTER — Telehealth: Payer: Self-pay | Admitting: Medical

## 2016-01-23 VITALS — BP 160/98 | HR 99 | Wt 282.8 lb

## 2016-01-23 DIAGNOSIS — E291 Testicular hypofunction: Secondary | ICD-10-CM | POA: Diagnosis not present

## 2016-01-23 DIAGNOSIS — R7301 Impaired fasting glucose: Secondary | ICD-10-CM | POA: Diagnosis not present

## 2016-01-23 DIAGNOSIS — R7989 Other specified abnormal findings of blood chemistry: Secondary | ICD-10-CM

## 2016-01-23 DIAGNOSIS — I10 Essential (primary) hypertension: Secondary | ICD-10-CM | POA: Diagnosis not present

## 2016-01-23 DIAGNOSIS — E669 Obesity, unspecified: Secondary | ICD-10-CM | POA: Diagnosis not present

## 2016-01-23 NOTE — Telephone Encounter (Signed)
Please call [facility] as there is a problem with both of his new prescription.    Verify that his Contrave label should say 1 tablet once daily x 1 week, then 1 tablet BID x 1 week, then 2 tablets in morning and 1 QHS on week 3, then 2 tablets BID thereafter, #120 and 2 refills  His Natesto should read 1 squirt in each nostril TID, dispense #3 bottles, 2 refills.    He told me today that he only got 2 bottles of Natesto and directions read 1 squirt BID instead.  Also, my understanding per the drug rep is that the [facility] gives the first month free.  (we have coupon cards for this as well).  So [facility] and let him and me know

## 2016-01-23 NOTE — Progress Notes (Signed)
Subjective: Chief Complaint  Patient presents with  . Follow-up    followup from new meds   Here for f/u.  This was suppose to be 55mo f/u after starting Natesto and Contrave, but due to insurance coverage delays, he just started [place] and Contrave within the past 1.5 weeks.   So far no side effects of either medication.   No major change in fatigue, libido or appetite so far.   His Natesto instructions were 1 squirt in each nostril BID although we had written to TID.    He notes that he has forgotten to take his BP the last 4 days.  No other aggravating or relieving factors. No other complaint.  Past Medical History:  Diagnosis Date  . ADHD (attention deficit hyperactivity disorder)    [facility]  . Allergy   . Anxiety   . Asthma   . Gastric ulcer   . GERD (gastroesophageal reflux disease)   . Hypertension   . Low testosterone   . Migraine   . Wears glasses    Current Outpatient Prescriptions on File Prior to Visit  Medication Sig Dispense Refill  . losartan-hydrochlorothiazide (HYZAAR) 100-25 MG tablet Take 1 tablet by mouth daily. 90 tablet 3  . Naltrexone-Bupropion HCl ER 8-90 MG TB12 Take 2 tablets by mouth 2 (two) times daily with a meal. 120 tablet 1  . Testosterone (NATESTO) 5.5 MG/ACT GEL Place 1 Squirt into the nose 3 (three) times daily. 14.64 g 2  . XARELTO 20 MG TABS tablet Take 1 tablet (20 mg total) by mouth daily. 90 tablet 3   No current facility-administered medications on file prior to visit.    ROS as in subjective  Objective BP (!) 160/98   Pulse 99   Wt 282 lb 12.8 oz (128.3 kg)   SpO2 96%   BMI 41.76 kg/m   Gen: wd, wn, nad Not otherwise examined   Assessment: Encounter Diagnoses  Name Primary?  . Essential hypertension Yes  . Impaired fasting blood sugar   . Low testosterone in male   . Obesity with serious comorbidity, unspecified classification, unspecified obesity type     Plan: Discussed  importance of compliance with BP medication.  C/t same medication Impaired glucose - c/t efforts with healthy diet, exercise, weight loss Low T- he will call or e-mail in 2-3 weeks to let me know how he is doing with Natesto.   Plan lab visit on TST in 6 weeks Obesity - c/t Contrave, give me update on weight and how he is doing with his efforts in 2-3 weeks We will call [facility] to clarify prescriptions since there was a discrepancy.

## 2016-01-24 NOTE — Telephone Encounter (Signed)
Call[facility] and talk with pharmacy [facility].

## 2016-01-25 ENCOUNTER — Encounter (HOSPITAL_COMMUNITY): Payer: Self-pay | Admitting: Family Medicine

## 2016-01-25 ENCOUNTER — Ambulatory Visit (HOSPITAL_COMMUNITY)
Admission: EM | Admit: 2016-01-25 | Discharge: 2016-01-25 | Disposition: A | Discharge status: Home / Self Care | Payer: 59 | Attending: Emergency Medicine | Admitting: Emergency Medicine

## 2016-01-25 ENCOUNTER — Ambulatory Visit (INDEPENDENT_AMBULATORY_CARE_PROVIDER_SITE_OTHER): Payer: 59

## 2016-01-25 DIAGNOSIS — M25561 Pain in right knee: Secondary | ICD-10-CM | POA: Diagnosis not present

## 2016-01-25 MED ORDER — HYDROCODONE-ACETAMINOPHEN 5-325 MG PO TABS: 1.0000 | ORAL_TABLET | Freq: Four times a day (QID) | ORAL | 0 refills | Status: DC | PRN

## 2016-01-25 MED ORDER — METHYLPREDNISOLONE ACETATE 80 MG/ML IJ SUSP
INTRAMUSCULAR | Status: AC
  Filled 2016-01-25: qty 1

## 2016-01-25 MED FILL — HYDROCODON-APAP 5-325: 5-325 | 4 days supply | Qty: 15 | Fill #0

## 2016-01-25 NOTE — ED Triage Notes (Signed)
Pt here for knee pain and swelling. sts the pain radiates into calf and foot.

## 2016-01-25 NOTE — Discharge Instructions (Signed)
I suspect you for your meniscus. We did a cortisone injection today. Please elevate and ice your knee today and tomorrow. Wear the knee sleeve for the next 2 weeks. Take Tylenol as needed for pain. Use the hydrocodone every 4-6 hours as needed for severe pain. Up with your primary care doctor.

## 2016-01-25 NOTE — ED Provider Notes (Signed)
[facility]    CSN: [id number] Arrival date & time: 01/25/16  1414     History   Chief Complaint Chief Complaint  Patient presents with  . Leg Pain    HPI Joel Wallace is a 35 y.o. male.   HPI  35 year old man here for evaluation of right knee pain. This pain started about a week ago. Initially was localized to the lateral aspect of the knee. It was worse if he turned his toes in. Over the week if the pain has gotten worse he has been walking with his toes turned out to help relieve the pain. The pain now involves the back of his knee and will occasionally shoot down into his ankle. He does report swelling. 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 He has had cortisone injections in the past.   Past Medical History:  Diagnosis Date  . ADHD (attention deficit hyperactivity disorder)    diagn[facility] 2012 [facility], [facility]  . Allergy   . Anxiety   . Asthma   . Gastric ulcer   . GERD (gastroesophageal reflux disease)   . Hypertension   . Low testosterone   . Migraine   . Wears glasses     Patient Active Problem List   Diagnosis Date Noted  . Impaired fasting blood sugar 12/17/2015  . Low testosterone in male 12/17/2015  . Obesity with serious comorbidity 12/17/2015  . Essential hypertension 12/17/2015  . High risk medication use 12/17/2015  . History of DVT (deep vein thrombosis) 12/17/2015    Past Surgical History:  Procedure Laterality Date  . COLONOSCOPY  2007  . ESOPHAGOGASTRODUODENOSCOPY  2007       Home Medications    Prior to Admission medications   Medication Sig Start Date End Date Taking? Authorizing Provider  HYDROcodone-acetaminophen (NORCO) 5-325 MG tablet Take 1 tablet by mouth every 6 (six) hours as needed for moderate pain. 01/25/16   Charm RingsErin J Cesiah Westley, MD  losartan-hydrochlorothiazide (HYZAAR) 100-25 MG tablet Take 1 tablet by mouth daily. 12/18/15   Kermit Baloavid S Tysinger, PA-C  Naltrexone-Bupropion HCl ER 8-90  MG TB12 Take 2 tablets by mouth 2 (two) times daily with a meal. 12/20/15   Jac Canavanavid S Tysinger, PA-C  Testosterone (NATESTO) 5.5 MG/ACT GEL Place 1 Squirt into the nose 3 (three) times daily. 01/10/16   Kermit Baloavid S Tysinger, PA-C  XARELTO 20 MG TABS tablet Take 1 tablet (20 mg total) by mouth daily. 12/18/15   Jac Canavanavid S Tysinger, PA-C    Family History Family History  Problem Relation Age of Onset  . Asthma Mother   . Ulcers Father   . Cancer Paternal Aunt   . Cancer Paternal Grandmother   . Clotting disorder Paternal Grandmother   . Clotting disorder Maternal Uncle   . Clotting disorder Cousin   . Heart disease Neg Hx   . Stroke Neg Hx     Social History Social History  Substance Use Topics  . Smoking status: Never Smoker  . Smokeless tobacco: Never Used  . Alcohol use Yes     Comment: rarely     Allergies   Patient has no known allergies.   Review of Systems Review of Systems As in history of present illness  Physical Exam Triage Vital Signs ED Triage Vitals  Enc Vitals Group     BP 01/25/16 1425 126/79     Pulse Rate 01/25/16 1425 82     Resp 01/25/16 1425 14     Temp 01/25/16  1425 98.6 F (37 C)     Temp Source 01/25/16 1425 Oral     SpO2 01/25/16 1425 97 %     Weight --      Height --      Head Circumference --      Peak Flow --      Pain Score 01/25/16 1430 7     Pain Loc --      Pain Edu? --      Excl. in GC? --    No data found.   Updated Vital Signs BP 134/92   Pulse 90   Temp 97.5 F (36.4 C) (Oral)   Resp 18   SpO2 97%   Visual Acuity Right Eye Distance:   Left Eye Distance:   Bilateral Distance:    Right Eye Near:   Left Eye Near:    Bilateral Near:     Physical Exam  Constitutional: He is oriented to person, place, and time. He appears well-developed and well-nourished. No distress.  Cardiovascular: Normal rate.   Pulmonary/Chest: Effort normal.  Musculoskeletal:  Right knee: No erythema. He does appear to have some swelling of the  lateral aspect of his knee as well as a small joint effusion. He is tender over the lateral meniscus. Positive McMurray's. No joint laxity.  Neurological: He is alert and oriented to person, place, and time.     UC Treatments / Results  Labs (all labs ordered are listed, but only abnormal results are displayed) Labs Reviewed - No data to display  EKG  EKG Interpretation None       Radiology Dg Knee Complete 4 Views Right  Result Date: 01/25/2016 CLINICAL DATA:  Right knee pain for 2 weeks, no acute injury EXAM: RIGHT KNEE - COMPLETE 4+ VIEW COMPARISON:  Right knee films of 01/09/1999 site FINDINGS: The medial and lateral compartments of the right knee are relatively well preserved with no significant degenerative change. Minimal spurring is noted from the superior patella. No fracture is seen. A small amount of right joint fluid cannot be excluded. IMPRESSION: No fracture. Cannot exclude a small amount of right knee joint fluid. Electronically Signed   By: Dwyane Dee M.D.   On: 01/25/2016 15:18    Procedures Injection of joint Date/Time: 01/25/2016 3:44 PM Performed by: Charm Rings Authorized by: Charm Rings  Consent: Verbal consent obtained. Consent given by: patient Patient identity confirmed: verbally with patient Comments: Skin prepped with alcohol. Cold spray was used for anesthetic. 80 mg Depo-Medrol with 4 mL of 2% lidocaine was injected in the right knee. Patient tolerated procedure well with no immediate complication.    (including critical care time)  Medications Ordered in UC Medications - No data to display   Initial Impression / Assessment and Plan / UC Course  I have reviewed the triage vital signs and the nursing notes.  Pertinent labs & imaging results that were available during my care of the patient were reviewed by me and considered in my medical decision making (see chart for details).  Clinical Course    Concern for meniscal tear. Cortisone  injection done. Knee sleeve given. Manage pain with Tylenol as needed. Prescription given for hydrocodone to use as needed for severe pain. Review from [place] shows a tramadol prescription in July, but nothing more recent.  Final Clinical Impressions(s) / UC Diagnoses   Final diagnoses:  Acute pain of right knee    New Prescriptions New Prescriptions   HYDROCODONE-ACETAMINOPHEN (NORCO)  5-325 MG TABLET    Take 1 tablet by mouth every 6 (six) hours as needed for moderate pain.     Charm RingsErin J Racer Quam, MD 01/25/16 [phone]

## 2016-02-27 ENCOUNTER — Ambulatory Visit (INDEPENDENT_AMBULATORY_CARE_PROVIDER_SITE_OTHER): Payer: 59 | Admitting: Medical

## 2016-02-27 ENCOUNTER — Encounter: Payer: Self-pay | Admitting: Medical

## 2016-02-27 VITALS — BP 132/88 | HR 88 | Wt 287.0 lb

## 2016-02-27 DIAGNOSIS — E291 Testicular hypofunction: Secondary | ICD-10-CM

## 2016-02-27 DIAGNOSIS — Z79899 Other long term (current) drug therapy: Secondary | ICD-10-CM

## 2016-02-27 DIAGNOSIS — E669 Obesity, unspecified: Secondary | ICD-10-CM | POA: Diagnosis not present

## 2016-02-27 DIAGNOSIS — I10 Essential (primary) hypertension: Secondary | ICD-10-CM

## 2016-02-27 DIAGNOSIS — J069 Acute upper respiratory infection, unspecified: Secondary | ICD-10-CM

## 2016-02-27 DIAGNOSIS — R7989 Other specified abnormal findings of blood chemistry: Secondary | ICD-10-CM

## 2016-02-27 MED ORDER — TESTOSTERONE 10 MG/ACT (2%) TD GEL: 2.0000 | Freq: Every day | TRANSDERMAL | 2 refills | Status: DC

## 2016-02-27 MED ORDER — PHENTERMINE-TOPIRAMATE ER 7.5-46 MG PO CP24: 1.0000 | ORAL_CAPSULE | ORAL | 1 refills | Status: DC

## 2016-02-27 MED ORDER — PHENTERMINE-TOPIRAMATE ER 3.75-23 MG PO CP24: 1.0000 | ORAL_CAPSULE | ORAL | 0 refills | Status: DC

## 2016-02-27 NOTE — Patient Instructions (Signed)
Recommendations:  [facility] for appointment for knee pain.  If they require official referral from us, then let us know  Begin Fortesta testosterone gel, 1 pump to each upper anterior thigh daily (2 pumps total daily).  Let the gel air dry, and put on clothes.  No one should touch the gel area for 2 hours  Plan to let me know in the next few weeks if you feel an improvement on the Fortesta gel  Regarding weight loss, begin Qsymia medication, see handout below  As you start Qsymia, cut back on Contrave to 1 tablet twice daily for 5 days, then 1 tablet daily for 5 days, then stop Contrave.   Medication costs:  If you get to the pharmacy and medication prescribed today was either too expensive, not covered by your insurance, or required prior authorization, then please call us back to let us know.  We often have no way to know if a medication is too expensive or not covered by your insurance.  Thanks for your cooperation.    Specific recommendations today include: Diet  Increase your water intake, get at least 64 ounces of water daily  Eat 3-4 fruits daily  Eat plenty of vegetables throughout the day, preferably each meal  Eat good sources of grains such as oatmeal, barley, whole grain pasta, whole grain bread, but limit the serving size to 1 cup of oatmeal or pasta per meal or 2 slices of bread per meal  We don't need to meat at each meal, however if you do eat meat, limit serving size to the size of your palm, and eat chicken fish or [place], lean cuts of meat  Eat beans every day as this is a good nutrient source and helps to curb appetite  Consider using a program such as Weight Watchers  Consider using a Smart phone app such as My Fitness PAL or [employer]est[employer] to track your calories and progress   Things to limit or avoid:  Avoid fast food, fried foods, fatty foods  Limit sweets, ice cream, cake and other baked goods  Avoid soda, beer, alcohol, sweet  tea  Exercise  You need to be exercising most days of the week for 30-45 minutes or more  Good forms of exercise include walking, hiking, stationary bike or bicycling outside, lap swimming, aerobics class, dance, Zumba  Consider getting a trainer at a gym to help with exercise  Medication  Begin Qsymia weight loss medication  Start by taking the Qsymia 3.75/23 mg dose, once daily in the morning before breakfast for the first 2 weeks  Then increase to the Qsymia 7.5/46mg  dose, once daily in the morning before breakfast  You will need to use the coupon card to call and activate the medication  If your insurance does not cover the weight loss medicine listed above, check on the insurance coverage for the following medications:  Qsymia or Saxenda  Consider weighing yourself daily to keep track of your weight

## 2016-02-27 NOTE — Progress Notes (Signed)
Subjective: Chief Complaint  Patient presents with  . follow up  meds    follow up from new meds it work for the first days and didn't help any more, congestion,    Here for f/u on BP, weight loss efforts, low testosterone and recent cough/cold symptoms  Obesity - he did start Contrave, and has been on max dose of 2 tablets BID for about 3 weeks.  Although it does decreased appetite, he denies weight loss.  Trying to eat healthy but he hasn't been able to exercise.   He report right knee injury 3 wk ago, was seen at urgent care, possible torn meniscus.  He has had knee problems bilat for years.  Has [facility] in the past, prior MRI was abnormal.  At urgent care recent had injection, was advise ice, rest.    He notes no improvement with Natesto testosterone therapy and it leaves very bad taste in the mouth.  He notes 1.5 day hx/o cough, congestion, picked up a respiratory infection from his son.   Past Medical History:  Diagnosis Date  . ADHD (attention deficit hyperactivity disorder)    [facility] 2012 [facility], [facility]  . Allergy   . Anxiety   . Asthma   . Gastric ulcer   . GERD (gastroesophageal reflux disease)   . Hypertension   . Low testosterone   . Migraine   . Wears glasses    Current Outpatient Prescriptions on File Prior to Visit  Medication Sig Dispense Refill  . losartan-hydrochlorothiazide (HYZAAR) 100-25 MG tablet Take 1 tablet by mouth daily. 90 tablet 3  . XARELTO 20 MG TABS tablet Take 1 tablet (20 mg total) by mouth daily. 90 tablet 3  . HYDROcodone-acetaminophen (NORCO) 5-325 MG tablet Take 1 tablet by mouth every 6 (six) hours as needed for moderate pain. (Patient not taking: Reported on 02/27/2016) 15 tablet 0   No current facility-administered medications on file prior to visit.    ROS as in subjective   Objective; BP 132/88   Pulse 88   Wt 287 lb (130.2 kg)   SpO2 97%   BMI 42.38 kg/m   General appearance: alert, no  distress, WD/WN, mildly ill appearing HEENT: normocephalic, sclerae anicteric, conjunctiva pink and moist, TMs pearly, nares patent, no discharge or erythema, pharynx normal, tonsils unremarkable Oral cavity: MMM, no lesions Neck: supple, no lymphadenopathy, no thyromegaly, no masses Heart: RRR, normal S1, S2, no murmurs Lungs: CTA bilaterally, no wheezes, rhonchi, or rales Pulses: 2+ symmetric     Assessment: Encounter Diagnoses  Name Primary?  . Low testosterone in male Yes  . Hypogonadism in male   . High risk medication use   . Obesity with serious comorbidity, unspecified classification, unspecified obesity type   . Essential hypertension   . Acute upper respiratory infection     Plan: Low T, hypogonadism - stop Natesto due to bad taste in mouth and not effective.  Begin trial of fortesta 1 pump per upper thigh daily. Failed Androgel prior Discussed risks/benefits of medication  Obesity - change to Qsymia.  Discussed risks/benefits of medication.  Stop Contrave due to lack of efficacy  HTN - c/t same medication  URI - discussed supportive care, f/u prn.  Joel Wallace was seen today for follow up  meds.  Diagnoses and all orders for this visit:  Low testosterone in male  Hypogonadism in male  High risk medication use  Obesity with serious comorbidity, unspecified classification, unspecified obesity type  Essential hypertension  Acute upper respiratory infection  Other orders -     Testosterone (FORTESTA) 10 MG/ACT (2%) GEL; Place 2 Squirts onto the skin daily. -     Phentermine-Topiramate (QSYMIA) 3.75-23 MG CP24; Take 1 capsule by mouth every morning. -     Phentermine-Topiramate (QSYMIA) 7.5-46 MG CP24; Take 1 capsule by mouth every morning.

## 2016-02-28 MED FILL — QSYMIA 3.75 MG-23 MG CAP: 3.75-23 | 14 days supply | Qty: 14 | Fill #0

## 2016-02-28 MED FILL — TESTOSTERONE 10 MG GEL PUMP: 10 MG/ACT | 60 days supply | Qty: 60 | Fill #0

## 2016-03-11 ENCOUNTER — Telehealth: Payer: Self-pay

## 2016-03-11 MED FILL — QSYMIA 7.5 MG-46 MG CAPSULE: 7.5-46 | 30 days supply | Qty: 30 | Fill #0

## 2016-03-11 NOTE — Telephone Encounter (Signed)
Failed Contrave d/t lack of efficacy per Vincenza HewsShane 02/27/16 OV note.   Called to initiate prior authorization of Qsymia with Vernona RiegerLaura at [phone].   Approved from today until 09/09/2016 [id number].   [facility] aware. Trixie Rude/RLB

## 2016-05-05 MED FILL — TESTOSTERONE 10 MG GEL PUMP: 10 MG/ACT | 60 days supply | Qty: 60 | Fill #1

## 2016-06-17 ENCOUNTER — Encounter: Payer: Self-pay | Admitting: Family Medicine

## 2016-06-17 ENCOUNTER — Ambulatory Visit (INDEPENDENT_AMBULATORY_CARE_PROVIDER_SITE_OTHER): Payer: 59 | Admitting: Family Medicine

## 2016-06-17 VITALS — BP 128/88 | HR 80 | Wt 284.0 lb

## 2016-06-17 DIAGNOSIS — M79605 Pain in left leg: Secondary | ICD-10-CM | POA: Diagnosis not present

## 2016-06-17 DIAGNOSIS — R51 Headache: Secondary | ICD-10-CM | POA: Diagnosis not present

## 2016-06-17 DIAGNOSIS — M79602 Pain in left arm: Secondary | ICD-10-CM

## 2016-06-17 DIAGNOSIS — R519 Headache, unspecified: Secondary | ICD-10-CM

## 2016-06-17 NOTE — Progress Notes (Signed)
   Subjective:    Patient ID: Joel Wallace, male    DOB: May 20, 1980, 36 y.o.   MRN: [id number]  HPI He complains of a three-day history of left arm and left leg pain. The past she has had fleeting episodes of this but in the last 3 days the pain has been more consistent. No numbness, tingling or weakness. The pains have not interfered with his ability to function. He states that on Sunday he also had a headache but at this point he is having no trouble. He does have a previous history of DVT in January 2016 but does not think it is related to that. He also states he testosterone for hypogonadism.   Review of Systems     Objective:   Physical Exam Alert and in no distress. EOMI. Other cranial nerves grossly intact. Normal motor, sensory and DTRs of his arms and legs.       Assessment & Plan:  Pain of left upper extremity  Nonintractable headache, unspecified chronicity pattern, unspecified headache type  Pain of left lower extremity I explained that at this point his symptoms are not clear-cut. Recommend he pain attention to his symptoms to see if anything is changed. Did recommend Tylenol for his discomfort. He will call or return here if he has further difficulties.

## 2016-06-24 ENCOUNTER — Encounter: Payer: Self-pay | Admitting: Family Medicine

## 2016-06-24 ENCOUNTER — Ambulatory Visit (INDEPENDENT_AMBULATORY_CARE_PROVIDER_SITE_OTHER): Payer: 59 | Admitting: Family Medicine

## 2016-06-24 VITALS — BP 128/76 | HR 66 | Wt 280.6 lb

## 2016-06-24 DIAGNOSIS — M25561 Pain in right knee: Secondary | ICD-10-CM

## 2016-06-24 DIAGNOSIS — M25461 Effusion, right knee: Secondary | ICD-10-CM | POA: Diagnosis not present

## 2016-06-24 LAB — CBC WITH DIFFERENTIAL/PLATELET
BASOS PCT: 0 %
Basophils Absolute: 0 cells/uL (ref 0–200)
EOS PCT: 1 %
Eosinophils Absolute: 77 cells/uL (ref 15–500)
HCT: 42.4 % (ref 38.5–50.0)
Hemoglobin: 14.4 g/dL (ref 13.2–17.1)
LYMPHS ABS: 3696 {cells}/uL (ref 850–3900)
Lymphocytes Relative: 48 %
MCH: 28.2 pg (ref 27.0–33.0)
MCHC: 34 g/dL (ref 32.0–36.0)
MCV: 83 fL (ref 80.0–100.0)
MONOS PCT: 7 %
MPV: 8.8 fL (ref 7.5–12.5)
Monocytes Absolute: 539 cells/uL (ref 200–950)
NEUTROS ABS: 3388 {cells}/uL (ref 1500–7800)
Neutrophils Relative %: 44 %
PLATELETS: 258 10*3/uL (ref 140–400)
RBC: 5.11 MIL/uL (ref 4.20–5.80)
RDW: 14.4 % (ref 11.0–15.0)
WBC: 7.7 10*3/uL (ref 4.0–10.5)

## 2016-06-24 LAB — COMPREHENSIVE METABOLIC PANEL
ALK PHOS: 47 U/L (ref 40–115)
ALT: 40 U/L (ref 9–46)
AST: 45 U/L — AB (ref 10–40)
Albumin: 4.8 g/dL (ref 3.6–5.1)
BILIRUBIN TOTAL: 0.7 mg/dL (ref 0.2–1.2)
BUN: 11 mg/dL (ref 7–25)
CHLORIDE: 101 mmol/L (ref 98–110)
CO2: 28 mmol/L (ref 20–31)
Calcium: 9.5 mg/dL (ref 8.6–10.3)
Creat: 1.2 mg/dL (ref 0.60–1.35)
Glucose, Bld: 89 mg/dL (ref 65–99)
Potassium: 4.3 mmol/L (ref 3.5–5.3)
SODIUM: 138 mmol/L (ref 135–146)
TOTAL PROTEIN: 7.3 g/dL (ref 6.1–8.1)

## 2016-06-24 NOTE — Progress Notes (Signed)
   Subjective:    Patient ID: Joel Wallace, male    DOB: 03/30/1980, 36 y.o.   MRN: [id number]  HPI He complains now of mainly right-sided lower extremity pain starting in the knee and going distal as well as proximal with physical activities. No other joints are involved. No numbness, tingling or weakness.   Review of Systems     Objective:   Physical Exam Alert and in no distress. Good motion of his hip without pain. Pain on motion of the knee. An effusion is present however it is not hot red or tender. No swelling or pain of joints of hands wrists elbows or other knee.       Assessment & Plan:  Acute pain of right knee  Knee effusion, right The knee was prepped with Betadine. An 18-gauge needle is inserted into the right knee from the superior lateral position however no fluid could be removed. 40 mg of Kenalog and 3 mL of Xylocaine was injected into the joint. Before he left he did receive about 50% reduction in his pain. Etiology of this is unclear but must consider an inflammatory type process.

## 2016-06-25 ENCOUNTER — Ambulatory Visit
Admission: RE | Admit: 2016-06-25 | Discharge: 2016-06-25 | Disposition: A | Discharge status: Home / Self Care | Payer: 59 | Source: Ambulatory Visit | Attending: Family Medicine | Admitting: Family Medicine

## 2016-06-25 DIAGNOSIS — S83011A Lateral subluxation of right patella, initial encounter: Secondary | ICD-10-CM | POA: Diagnosis not present

## 2016-06-25 DIAGNOSIS — M25561 Pain in right knee: Secondary | ICD-10-CM

## 2016-06-25 DIAGNOSIS — M25461 Effusion, right knee: Secondary | ICD-10-CM

## 2016-06-25 LAB — SEDIMENTATION RATE: Sed Rate: 8 mm/hr (ref 0–15)

## 2016-07-08 ENCOUNTER — Encounter: Payer: Self-pay | Admitting: Medical

## 2016-07-09 ENCOUNTER — Telehealth: Payer: Self-pay | Admitting: Medical

## 2016-07-09 ENCOUNTER — Ambulatory Visit (INDEPENDENT_AMBULATORY_CARE_PROVIDER_SITE_OTHER): Payer: 59 | Admitting: Family Medicine

## 2016-07-09 ENCOUNTER — Encounter: Payer: Self-pay | Admitting: Family Medicine

## 2016-07-09 VITALS — BP 140/80 | HR 84 | Wt 279.0 lb

## 2016-07-09 DIAGNOSIS — M25461 Effusion, right knee: Secondary | ICD-10-CM

## 2016-07-09 NOTE — Telephone Encounter (Signed)
He emailed about coming back for knee effusion

## 2016-07-09 NOTE — Progress Notes (Signed)
   Subjective:    Patient ID: Joel Wallace, male    DOB: 02/07/1981, 36 y.o.   MRN: [id number]  HPI He is here for recheck on knee pain and swelling. He was seen on April 10. An attempt was made to aspirate fluid and none was obtained. X-ray was negative. He was given an injection but he states it did not last very long. He continues have difficulty with discomfort and swelling.   Review of Systems     Objective:   Physical Exam  Lurk and in no distress. Right knee exam does show an effusion. It is not hot red or tender.       Assessment & Plan:  Knee effusion, right  He would like another injection. The right knee was prepped with Betadine in the superior lateral patellar area. 30 mL of clear yellow fluid was removed without difficulty and 40 mg of Kenalog and 2 mL of Xylocaine was injected. Fluid was sent off for crystal analysis.

## 2016-07-09 NOTE — Telephone Encounter (Signed)
See if he can come in today for Dr. Jola Babinski schedule 1:30pm for knee aspiration/knee pain.

## 2016-07-10 LAB — SYNOVIAL FLUID, CRYSTAL

## 2016-08-12 ENCOUNTER — Emergency Department (HOSPITAL_COMMUNITY)
Admission: EM | Admit: 2016-08-12 | Discharge: 2016-08-12 | Disposition: A | Discharge status: Home / Self Care | Payer: Worker's Compensation | Attending: Emergency Medicine | Admitting: Emergency Medicine

## 2016-08-12 ENCOUNTER — Emergency Department (HOSPITAL_COMMUNITY): Payer: Worker's Compensation

## 2016-08-12 ENCOUNTER — Encounter (HOSPITAL_COMMUNITY): Payer: Self-pay | Admitting: Family Medicine

## 2016-08-12 DIAGNOSIS — Z79899 Other long term (current) drug therapy: Secondary | ICD-10-CM | POA: Diagnosis not present

## 2016-08-12 DIAGNOSIS — X58XXXA Exposure to other specified factors, initial encounter: Secondary | ICD-10-CM | POA: Insufficient documentation

## 2016-08-12 DIAGNOSIS — J45909 Unspecified asthma, uncomplicated: Secondary | ICD-10-CM | POA: Insufficient documentation

## 2016-08-12 DIAGNOSIS — Y9389 Activity, other specified: Secondary | ICD-10-CM | POA: Insufficient documentation

## 2016-08-12 DIAGNOSIS — R1084 Generalized abdominal pain: Secondary | ICD-10-CM

## 2016-08-12 DIAGNOSIS — F909 Attention-deficit hyperactivity disorder, unspecified type: Secondary | ICD-10-CM | POA: Insufficient documentation

## 2016-08-12 DIAGNOSIS — Z7901 Long term (current) use of anticoagulants: Secondary | ICD-10-CM | POA: Insufficient documentation

## 2016-08-12 DIAGNOSIS — Y929 Unspecified place or not applicable: Secondary | ICD-10-CM | POA: Diagnosis not present

## 2016-08-12 DIAGNOSIS — Y999 Unspecified external cause status: Secondary | ICD-10-CM | POA: Insufficient documentation

## 2016-08-12 DIAGNOSIS — S3991XA Unspecified injury of abdomen, initial encounter: Secondary | ICD-10-CM | POA: Diagnosis present

## 2016-08-12 DIAGNOSIS — I10 Essential (primary) hypertension: Secondary | ICD-10-CM | POA: Diagnosis not present

## 2016-08-12 DIAGNOSIS — R1012 Left upper quadrant pain: Secondary | ICD-10-CM

## 2016-08-12 DIAGNOSIS — S39011A Strain of muscle, fascia and tendon of abdomen, initial encounter: Secondary | ICD-10-CM | POA: Diagnosis not present

## 2016-08-12 HISTORY — DX: Acute embolism and thrombosis of unspecified deep veins of unspecified lower extremity: I82.409

## 2016-08-12 LAB — CBC WITH DIFFERENTIAL/PLATELET
Basophils Absolute: 0 10*3/uL (ref 0.0–0.1)
Basophils Relative: 0 %
EOS ABS: 0.1 10*3/uL (ref 0.0–0.7)
Eosinophils Relative: 1 %
HCT: 41.8 % (ref 39.0–52.0)
HEMOGLOBIN: 13.8 g/dL (ref 13.0–17.0)
Lymphocytes Relative: 52 %
Lymphs Abs: 3.5 10*3/uL (ref 0.7–4.0)
MCH: 28.2 pg (ref 26.0–34.0)
MCHC: 33 g/dL (ref 30.0–36.0)
MCV: 85.3 fL (ref 78.0–100.0)
MONOS PCT: 7 %
Monocytes Absolute: 0.5 10*3/uL (ref 0.1–1.0)
NEUTROS PCT: 40 %
Neutro Abs: 2.7 10*3/uL (ref 1.7–7.7)
Platelets: 259 10*3/uL (ref 150–400)
RBC: 4.9 MIL/uL (ref 4.22–5.81)
RDW: 13.6 % (ref 11.5–15.5)
WBC: 6.7 10*3/uL (ref 4.0–10.5)

## 2016-08-12 LAB — I-STAT CHEM 8, ED
BUN: 13 mg/dL (ref 6–20)
CALCIUM ION: 1.16 mmol/L (ref 1.15–1.40)
Chloride: 105 mmol/L (ref 101–111)
Creatinine, Ser: 1 mg/dL (ref 0.61–1.24)
Glucose, Bld: 92 mg/dL (ref 65–99)
HCT: 41 % (ref 39.0–52.0)
HEMOGLOBIN: 13.9 g/dL (ref 13.0–17.0)
Potassium: 3.9 mmol/L (ref 3.5–5.1)
SODIUM: 140 mmol/L (ref 135–145)
TCO2: 25 mmol/L (ref 0–100)

## 2016-08-12 LAB — D-DIMER, QUANTITATIVE (NOT AT ARMC)

## 2016-08-12 MED ORDER — IOPAMIDOL (ISOVUE-300) INJECTION 61%
INTRAVENOUS | Status: AC
  Administered 2016-08-12: 100 mL via INTRAVENOUS
  Filled 2016-08-12: qty 100

## 2016-08-12 MED ORDER — IOPAMIDOL (ISOVUE-300) INJECTION 61%
100.0000 mL | Freq: Once | INTRAVENOUS | Status: AC | PRN
  Administered 2016-08-12: 100 mL via INTRAVENOUS

## 2016-08-12 MED ORDER — HYDROCODONE-ACETAMINOPHEN 5-325 MG PO TABS: 2.0000 | ORAL_TABLET | ORAL | 0 refills | Status: DC | PRN

## 2016-08-12 MED ORDER — MORPHINE SULFATE (PF) 2 MG/ML IV SOLN
4.0000 mg | Freq: Once | INTRAVENOUS | Status: AC
Start: 2016-08-12 — End: 2016-08-12
  Administered 2016-08-12: 4 mg via INTRAVENOUS
  Filled 2016-08-12: qty 2

## 2016-08-12 NOTE — ED Provider Notes (Signed)
[facility]-EMERGENCY DEPT Provider Note   CSN: [id number] Arrival date & time: 08/12/16  0151     History   Chief Complaint Chief Complaint  Patient presents with  . Abdominal Pain    HPI Joel Wallace is a 36 y.o. male with history of hypertension and DVT presents to ED with sudden onset left upper quadrant abdominal pain that started while patient was picking up a 65 pound box and sneezed at the same time. Patient reports feeling a "pop" in his abdomen. Pain is exacerbated by direct palpation, coughing and taking a deep breath. No associated fevers, nausea, vomiting, chest pain, shortness of breath. No recent abdominal surgeries. Patient has not taken blood pressure or Eliquis in the last few days since running out of his prescriptions. He  HPI  Past Medical History:  Diagnosis Date  . ADHD (attention deficit hyperactivity disorder)    diagnosed 2012 by psychiatry, [facility]  . Allergy   . Anxiety   . Asthma   . DVT (deep venous thrombosis) (HCC)   . Gastric ulcer   . GERD (gastroesophageal reflux disease)   . Hypertension   . Low testosterone   . Migraine   . Wears glasses     Patient Active Problem List   Diagnosis Date Noted  . Hypogonadism in male 02/27/2016  . Impaired fasting blood sugar 12/17/2015  . Low testosterone in male 12/17/2015  . Obesity with serious comorbidity 12/17/2015  . Essential hypertension 12/17/2015  . High risk medication use 12/17/2015  . History of DVT (deep vein thrombosis) 12/17/2015    Past Surgical History:  Procedure Laterality Date  . COLONOSCOPY  2007  . ESOPHAGOGASTRODUODENOSCOPY  2007       Home Medications    Prior to Admission medications   Medication Sig Start Date End Date Taking? Authorizing Provider  losartan-hydrochlorothiazide (HYZAAR) 100-25 MG tablet Take 1 tablet by mouth daily. Patient not taking: Reported on 08/12/2016 12/18/15   Tysinger, Kermit Balo, PA-C  Testosterone (FORTESTA) 10 MG/ACT (2%)  GEL Place 2 Squirts onto the skin daily. Patient not taking: Reported on 08/12/2016 02/27/16   Tysinger, Kermit Balo, PA-C  XARELTO 20 MG TABS tablet Take 1 tablet (20 mg total) by mouth daily. Patient not taking: Reported on 08/12/2016 12/18/15   Tysinger, Kermit Balo, PA-C    Family History Family History  Problem Relation Age of Onset  . Asthma Mother   . Ulcers Father   . Cancer Paternal Aunt   . Cancer Paternal Grandmother   . Clotting disorder Paternal Grandmother   . Clotting disorder Maternal Uncle   . Clotting disorder Cousin   . Heart disease Neg Hx   . Stroke Neg Hx     Social History Social History  Substance Use Topics  . Smoking status: Never Smoker  . Smokeless tobacco: Never Used  . Alcohol use Yes     Comment: 2 twice a month      Allergies   Patient has no known allergies.   Review of Systems Review of Systems  Constitutional: Negative for fever.  Respiratory: Negative for cough, choking, chest tightness and shortness of breath.   Cardiovascular: Negative for chest pain and palpitations.  Gastrointestinal: Positive for abdominal pain. Negative for constipation, diarrhea, nausea and vomiting.  Genitourinary: Negative for decreased urine volume, difficulty urinating, flank pain and hematuria.  Musculoskeletal: Negative for back pain.  Skin: Negative for wound.  Allergic/Immunologic: Negative for immunocompromised state.  Neurological: Negative for light-headedness and headaches.  Physical Exam Updated Vital Signs BP (!) 149/110 (BP Location: Left Arm) Comment: pt states he has not taken his BP med in two days.   Pulse 65   Temp 98 F (36.7 C) (Oral)   Resp 18   SpO2 98%   Physical Exam  Constitutional: He is oriented to person, place, and time. He appears well-developed and well-nourished. No distress.  HENT:  Head: Normocephalic and atraumatic.  Nose: Nose normal.  Mouth/Throat: Oropharynx is clear and moist. No oropharyngeal exudate.  Eyes:  Conjunctivae and EOM are normal. Pupils are equal, round, and reactive to light.  Neck: Normal range of motion. Neck supple. No tracheal deviation present.  Cardiovascular: Normal rate, regular rhythm, normal heart sounds and intact distal pulses.  Exam reveals no friction rub.   No murmur heard. Pulmonary/Chest: Effort normal and breath sounds normal. No respiratory distress. He has no wheezes. He has no rales. He exhibits no tenderness.  Abdominal: Soft. Bowel sounds are normal. He exhibits no distension. There is tenderness in the left upper quadrant. There is no rigidity, no rebound, no guarding, no CVA tenderness, no tenderness at McBurney's point and negative Murphy's sign.    Musculoskeletal: Normal range of motion.  Lymphadenopathy:    He has no cervical adenopathy.  Neurological: He is alert and oriented to person, place, and time.  Skin: Skin is warm and dry. Capillary refill takes less than 2 seconds.  Psychiatric: He has a normal mood and affect. His behavior is normal. Judgment and thought content normal.  Nursing note and vitals reviewed.    ED Treatments / Results  Labs (all labs ordered are listed, but only abnormal results are displayed) Labs Reviewed  CBC WITH DIFFERENTIAL/PLATELET  D-DIMER, QUANTITATIVE (NOT [facility])  I-STAT CHEM 8, ED    EKG  EKG Interpretation None       Radiology No results found.  Procedures Procedures (including critical care time)  Medications Ordered in ED Medications  morphine 2 MG/ML injection 4 mg (4 mg Intravenous Given 08/12/16 0602)  iopamidol (ISOVUE-300) 61 % injection 100 mL (100 mLs Intravenous Contrast Given 08/12/16 0709)     Initial Impression / Assessment and Plan / ED Course  I have reviewed the triage vital signs and the nursing notes.  Pertinent labs & imaging results that were available during my care of the patient were reviewed by me and considered in my medical decision making (see chart for details).       36 year old male presents with sudden onset left upper quadrant abdominal pain that started while picking up a heavy box and sneezing. On exam vital signs are within normal limits. Patient is nontoxic appearing. He has LUQ tenderness without guarding or rigidity. No CVAT. Cardiopulmonary exam unremarkable. Distal pulses intaact and symmetric. Concerned for hernia, less likely PTX or dissection. BP slightly elevated however patient has been non compliant with BP medications.   Chem 8, cbc unremarkable. D-dimer, CXR and CT a/p pending at shift change. Patient discussed with supervision physician who agrees with ED tx.   Patient will be handed off to oncoming EDPA K.Sofia who will f/u on pending ED lab work and imaging and decide on disposition.   Final Clinical Impressions(s) / ED Diagnoses   Final diagnoses:  Left upper quadrant pain    New Prescriptions New Prescriptions   No medications on file     Jerrell MylarGibbons, Jovian Lembcke J, PA-C 08/12/16 [id number]    Palumbo, April, MD 08/12/16 [phone]

## 2016-08-12 NOTE — ED Triage Notes (Signed)
Patient reports he was picking up a box, sneezed and felt like a "pop" in is abd. Pt is complaining of left upper quad pain with no nausea, vomiting, or diarrhea.

## 2016-08-12 NOTE — Discharge Instructions (Signed)
See your Physician for recheck in 2-3 days  

## 2016-08-12 NOTE — ED Provider Notes (Signed)
Results for orders placed or performed during the hospital encounter of 08/12/16  CBC with Differential  Result Value Ref Range   WBC 6.7 4.0 - 10.5 K/uL   RBC 4.90 4.22 - 5.81 MIL/uL   Hemoglobin 13.8 13.0 - 17.0 g/dL   HCT 82.941.8 56.239.0 - 13.052.0 %   MCV 85.3 78.0 - 100.0 fL   MCH 28.2 26.0 - 34.0 pg   MCHC 33.0 30.0 - 36.0 g/dL   RDW 86.513.6 78.411.5 - 69.615.5 %   Platelets 259 150 - 400 K/uL   Neutrophils Relative % 40 %   Neutro Abs 2.7 1.7 - 7.7 K/uL   Lymphocytes Relative 52 %   Lymphs Abs 3.5 0.7 - 4.0 K/uL   Monocytes Relative 7 %   Monocytes Absolute 0.5 0.1 - 1.0 K/uL   Eosinophils Relative 1 %   Eosinophils Absolute 0.1 0.0 - 0.7 K/uL   Basophils Relative 0 %   Basophils Absolute 0.0 0.0 - 0.1 K/uL  I-Stat Chem 8, ED  Result Value Ref Range   Sodium 140 135 - 145 mmol/L   Potassium 3.9 3.5 - 5.1 mmol/L   Chloride 105 101 - 111 mmol/L   BUN 13 6 - 20 mg/dL   Creatinine, Ser 2.951.00 0.61 - 1.24 mg/dL   Glucose, Bld 92 65 - 99 mg/dL   Calcium, Ion 2.841.16 1.321.15 - 1.40 mmol/L   TCO2 25 0 - 100 mmol/L   Hemoglobin 13.9 13.0 - 17.0 g/dL   HCT 44.041.0 10.239.0 - 72.552.0 %   Dg Chest 2 View  Result Date: 08/12/2016 CLINICAL DATA:  Left upper quadrant pain. The patient felt a pop when lifting a box and sneezing last night. EXAM: CHEST  2 VIEW COMPARISON:  CT chest and PA and lateral chest 06/12/2015. FINDINGS: The lungs are clear. Heart size is normal. No pneumothorax or pleural effusion. No bony abnormality. IMPRESSION: Negative chest. Electronically Signed   By: Drusilla Kannerhomas  Dalessio M.D.   On: 08/12/2016 07:33   Ct Abdomen Pelvis W Contrast  Result Date: 08/12/2016 CLINICAL DATA:  Left upper quadrant pain. Felt a pop when picking up something heavy. Initial encounter. EXAM: CT ABDOMEN AND PELVIS WITH CONTRAST TECHNIQUE: Multidetector CT imaging of the abdomen and pelvis was performed using the standard protocol following bolus administration of intravenous contrast. CONTRAST:  100 cc Isovue-300 intravenous  COMPARISON:  09/06/2005 FINDINGS: Lower chest:  No contributory findings. Hepatobiliary: No focal liver abnormality.No evidence of biliary obstruction or stone. Pancreas: Unremarkable. Spleen: Unremarkable. Adrenals/Urinary Tract: Negative adrenals. No hydronephrosis or stone. Unremarkable bladder. Stomach/Bowel:  No obstruction. No appendicitis. Vascular/Lymphatic: No acute vascular abnormality. No mass or adenopathy. Reproductive:No pathologic findings. Other: No ascites or pneumoperitoneum. Fatty umbilical and left inguinal hernias. Musculoskeletal: No acute abnormalities. IMPRESSION: 1. No acute or posttraumatic finding. 2. Small fatty umbilical and left inguinal hernias. Electronically Signed   By: Marnee SpringJonathon  Watts M.D.   On: 08/12/2016 07:25   Pt's labs and ct reviewed.  Pt reexamined I suspect muscle tear, Pt does have a fat hernia.   Pt advised to follow up with his MD.  Gustavus BryantIce to area. Hydrocodone for pain.   An After Visit Summary was printed and given to the patient.    Elson AreasSofia, Leslie K, [place] JerseyPA-C 08/12/16 0920    Maia PlanLong, Joshua G, MD 08/12/16 [phone]

## 2016-09-01 ENCOUNTER — Encounter (HOSPITAL_COMMUNITY): Payer: Self-pay | Admitting: Emergency Medicine

## 2016-09-01 ENCOUNTER — Ambulatory Visit (HOSPITAL_COMMUNITY)
Admission: EM | Admit: 2016-09-01 | Discharge: 2016-09-01 | Disposition: A | Discharge status: Home / Self Care | Payer: 59 | Attending: Family Medicine | Admitting: Family Medicine

## 2016-09-01 DIAGNOSIS — M25461 Effusion, right knee: Secondary | ICD-10-CM

## 2016-09-01 DIAGNOSIS — G8929 Other chronic pain: Secondary | ICD-10-CM

## 2016-09-01 DIAGNOSIS — M25561 Pain in right knee: Secondary | ICD-10-CM

## 2016-09-01 MED ORDER — TRIAMCINOLONE ACETONIDE 40 MG/ML IJ SUSP
INTRAMUSCULAR | Status: AC
  Filled 2016-09-01: qty 1

## 2016-09-01 NOTE — ED Triage Notes (Signed)
[facility] a [facility] right [facility]. The patient reported a hx of having to have fluid drained off of his knee.

## 2016-09-01 NOTE — ED Provider Notes (Signed)
CSN: [id number]     Arrival date & time 09/01/16  1455 History      Chief Complaint  Patient presents with  . Knee Pain    36 yo male with PMH of bilateral knee osteoarthritis/effusion and DVT on Xarelto comes in with 2 week history of acute on chronic right knee pain/swelling. Denies new injury to the knee. Has been putting ice/heat with no relief. Takes Tylenol on and off for pain. Patient states swelling has gotten worse this past week, and has resulted in decrease in range of motion. He states he has had aspirations of the knee in the past and showed calcium pyrophosphate crystals. Denies redness and increased warmth of the joint. Has noticed some numbness and tingling of the posterior knee when swelling is at its worst. He notices some hip and ankle pain after exacerbations of knee pain he associates with him limping to accommodate for the knee pain. Denies fever, chills, night sweats.       Past Medical History:  Diagnosis Date  . ADHD (attention deficit hyperactivity disorder)    diagnosed 2012 by psychiatry, [facility]  . Allergy   . Anxiety   . Asthma   . DVT (deep venous thrombosis) (HCC)   . Gastric ulcer   . GERD (gastroesophageal reflux disease)   . Hypertension   . Low testosterone   . Migraine   . Wears glasses    Past Surgical History:  Procedure Laterality Date  . COLONOSCOPY  2007  . ESOPHAGOGASTRODUODENOSCOPY  2007   Family History  Problem Relation Age of Onset  . Asthma Mother   . Ulcers Father   . Cancer Paternal Aunt   . Cancer Paternal Grandmother   . Clotting disorder Paternal Grandmother   . Clotting disorder Maternal Uncle   . Clotting disorder Cousin   . Heart disease Neg Hx   . Stroke Neg Hx    Social History  Substance Use Topics  . Smoking status: Never Smoker  . Smokeless tobacco: Never Used  . Alcohol use Yes     Comment: 2 twice a month     Review of Systems  Constitutional: Negative for chills, diaphoresis, fatigue  and fever.  Respiratory: Negative for chest tightness, shortness of breath and wheezing.   Cardiovascular: Negative for chest pain, palpitations and leg swelling.  Musculoskeletal: Positive for arthralgias, joint swelling and myalgias.    Allergies  Patient has no known allergies.  Home Medications   Prior to Admission medications   Not on File   Meds Ordered and Administered this Visit  Medications - No data to display  BP (!) 149/93 (BP Location: Right Arm)   Pulse 70   Temp 98.7 F (37.1 C) (Oral)   Resp 20   SpO2 100%  No data found.   Physical Exam  Constitutional: He is oriented to person, place, and time. He appears well-developed and well-nourished. No distress.  HENT:  Head: Normocephalic and atraumatic.  Eyes: Conjunctivae are normal. Pupils are equal, round, and reactive to light.  Cardiovascular: Normal rate and regular rhythm.  Exam reveals no gallop and no friction rub.   No murmur heard. Pulmonary/Chest: Effort normal and breath sounds normal.  Musculoskeletal:       Right knee: He exhibits decreased range of motion, swelling and effusion. He exhibits no ecchymosis and no bony tenderness.       Left knee: Normal. He exhibits normal range of motion, no swelling and no effusion. No tenderness found.  Tenderness on palpation of right knee joint line medial > lateral.  Patient able to ambulate. But with limping and pain.   Neurological: He is alert and oriented to person, place, and time.  Skin: Skin is warm and dry.  Psychiatric: He has a normal mood and affect. His behavior is normal. Judgment normal.    Urgent Care Course     Injection of joint Date/Time: 09/01/2016 4:18 PM Performed by: Linward HeadlandYU, AMY V Authorized by: Mardella LaymanHAGLER, BRIAN  Consent: Verbal consent obtained. Risks and benefits: risks, benefits and alternatives were discussed Consent given by: patient Patient understanding: patient states understanding of the procedure being performed Patient  consent: the patient's understanding of the procedure matches consent given Procedure consent: procedure consent matches procedure scheduled Site marked: the operative site was marked Patient identity confirmed: verbally with patient Preparation: Patient was prepped and draped in the usual sterile fashion. Local anesthesia used: yes Anesthesia: local infiltration  Anesthesia: Local anesthesia used: yes Local Anesthetic: bupivacaine 0.5% without epinephrine and lidocaine 2% without epinephrine  Sedation: Patient sedated: no Patient tolerance: Patient tolerated the procedure well with no immediate complications Comments: 1mL Kenolog, 2 mL Marcaine and 1 mL of Lidocaine was used. Entered on the medial side.     (including critical care time)  Labs Review Labs Reviewed - No data to display  Imaging Review No results found.       MDM   1. Chronic pain of right knee   2. Effusion of right knee    1. Discussed with patient options of treatment for his knee pain. As he is on Xarelto for history of DVT, cannot prescribe NSAIDS for pain relief/antiinflammatory effect. Patient has tolerated knee injections in the past with good relief. Discussed risks and benefits of knee injections, patient would like to proceed. Continue Tylenol for pain. Continue ice/heat, and elevation.  2. Discussed referral to orthopedics for further evaluation and treatment of knee pain. Resources given at this visit.  3. Discussed weight loss to alleviate stress on knees such as water aerobics. Patient expresses understanding.     Belinda FisherYu, Amy V, PA-C 09/01/16 1630

## 2016-12-08 ENCOUNTER — Emergency Department (HOSPITAL_COMMUNITY): Payer: Self-pay

## 2016-12-08 ENCOUNTER — Encounter (HOSPITAL_COMMUNITY): Payer: Self-pay | Admitting: Emergency Medicine

## 2016-12-08 ENCOUNTER — Emergency Department (HOSPITAL_COMMUNITY)
Admission: EM | Admit: 2016-12-08 | Discharge: 2016-12-08 | Disposition: A | Discharge status: Home / Self Care | Payer: Self-pay | Attending: Emergency Medicine | Admitting: Emergency Medicine

## 2016-12-08 DIAGNOSIS — S86911A Strain of unspecified muscle(s) and tendon(s) at lower leg level, right leg, initial encounter: Secondary | ICD-10-CM | POA: Insufficient documentation

## 2016-12-08 DIAGNOSIS — I10 Essential (primary) hypertension: Secondary | ICD-10-CM | POA: Insufficient documentation

## 2016-12-08 DIAGNOSIS — Y939 Activity, unspecified: Secondary | ICD-10-CM | POA: Insufficient documentation

## 2016-12-08 DIAGNOSIS — S8391XA Sprain of unspecified site of right knee, initial encounter: Secondary | ICD-10-CM

## 2016-12-08 DIAGNOSIS — Y92009 Unspecified place in unspecified non-institutional (private) residence as the place of occurrence of the external cause: Secondary | ICD-10-CM | POA: Insufficient documentation

## 2016-12-08 DIAGNOSIS — J45909 Unspecified asthma, uncomplicated: Secondary | ICD-10-CM | POA: Insufficient documentation

## 2016-12-08 DIAGNOSIS — M25561 Pain in right knee: Secondary | ICD-10-CM

## 2016-12-08 DIAGNOSIS — Y999 Unspecified external cause status: Secondary | ICD-10-CM | POA: Insufficient documentation

## 2016-12-08 DIAGNOSIS — W1840XA Slipping, tripping and stumbling without falling, unspecified, initial encounter: Secondary | ICD-10-CM | POA: Insufficient documentation

## 2016-12-08 DIAGNOSIS — G8929 Other chronic pain: Secondary | ICD-10-CM | POA: Insufficient documentation

## 2016-12-08 MED ORDER — MELOXICAM 15 MG PO TABS: 15.0000 mg | ORAL_TABLET | Freq: Every day | ORAL | 0 refills | Status: DC

## 2016-12-08 NOTE — Progress Notes (Signed)
Orthopedic Tech Progress Note Patient Details:  Joel Wallace [birth year] [id number]  Ortho Devices Type of Ortho Device: Knee Immobilizer, Crutches Ortho Device/Splint Location: rle Ortho Device/Splint Interventions: Application   [name] 12/08/2016, 11:12 AM

## 2016-12-08 NOTE — ED Notes (Signed)
Patient is resting on the monitor with call bell in reach

## 2016-12-08 NOTE — ED Provider Notes (Signed)
[facility] DEPT Provider Note   CSN: [id number] Arrival date & time: 12/08/16  4098     History   Chief Complaint Chief Complaint  Patient presents with  . Leg Pain    HPI Joel Wallace is a 36 y.o. male with a PMHx of b/l knee arthritis, HTN, GERD, asthma, and remote DVT, who presents to the ED with complaints of acute on chronic right knee pain 1 day. He was getting off of his recliner last night when he slipped forward in the chair, and as he was standing up his knee twisted inward and he felt a pop. He states that he has had issues with both of his knees in the past, has seen Dr. Thurston Hole for arthrocentesis and steroid shots in the past, and last year at urgent care was diagnosed with a meniscal tear based on his exam. He wonders whether he could have worsened this potential meniscal tear last night. He has never had an MRI of this knee, however he has had an MRI of his left knee in the past. He has known arthritis in both knees. He describes his pain today as 7/10 constant dull nonradiating right knee pain worse with weightbearing, improved with rest, and unrelieved with ice. No other treatments tried prior to arrival. He denies any swelling, bruising, erythema, warmth, fevers, chills, CP, SOB, abd pain, N/V/D/C, hematuria, dysuria, myalgias, numbness, tingling, focal weakness, or any other complaints at this time.    The history is provided by the patient and medical records. No language interpreter was used.  Knee Pain   This is a recurrent problem. The current episode started yesterday. The problem occurs constantly. The problem has not changed since onset.The pain is present in the right knee. The quality of the pain is described as dull. The pain is at a severity of 7/10. The pain is moderate. Pertinent negatives include no numbness, full range of motion and no tingling. The symptoms are aggravated by activity and standing. He has tried cold and rest for the symptoms. The  treatment provided mild relief. There has been a history of trauma.    Past Medical History:  Diagnosis Date  . ADHD (attention deficit hyperactivity disorder)    diagnosed 2012 by psychiatry, [facility]  . Allergy   . Anxiety   . Asthma   . DVT (deep venous thrombosis) (HCC)   . Gastric ulcer   . GERD (gastroesophageal reflux disease)   . Hypertension   . Low testosterone   . Migraine   . Wears glasses     Patient Active Problem List   Diagnosis Date Noted  . Hypogonadism in male 02/27/2016  . Impaired fasting blood sugar 12/17/2015  . Low testosterone in male 12/17/2015  . Obesity with serious comorbidity 12/17/2015  . Essential hypertension 12/17/2015  . High risk medication use 12/17/2015  . History of DVT (deep vein thrombosis) 12/17/2015    Past Surgical History:  Procedure Laterality Date  . COLONOSCOPY  2007  . ESOPHAGOGASTRODUODENOSCOPY  2007       Home Medications    Prior to Admission medications   Not on File    Family History Family History  Problem Relation Age of Onset  . Asthma Mother   . Ulcers Father   . Cancer Paternal Aunt   . Cancer Paternal Grandmother   . Clotting disorder Paternal Grandmother   . Clotting disorder Maternal Uncle   . Clotting disorder Cousin   . Heart disease Neg Hx   .  Stroke Neg Hx     Social History Social History  Substance Use Topics  . Smoking status: Never Smoker  . Smokeless tobacco: Never Used  . Alcohol use Yes     Comment: 2 twice a month      Allergies   Patient has no known allergies.   Review of Systems Review of Systems  Constitutional: Negative for chills and fever.  Respiratory: Negative for shortness of breath.   Cardiovascular: Negative for chest pain.  Gastrointestinal: Negative for abdominal pain, constipation, diarrhea, nausea and vomiting.  Genitourinary: Negative for dysuria and hematuria.  Musculoskeletal: Positive for arthralgias. Negative for joint swelling  and myalgias.  Skin: Negative for color change.  Allergic/Immunologic: Negative for immunocompromised state.  Neurological: Negative for tingling, weakness and numbness.  Psychiatric/Behavioral: Negative for confusion.   All other systems reviewed and are negative for acute change except as noted in the HPI.    Physical Exam Updated Vital Signs BP (!) 154/106 (BP Location: Right Arm)   Pulse 72   Temp 98.3 F (36.8 C) (Oral)   Resp 18   SpO2 100%   Physical Exam  Constitutional: He is oriented to person, place, and time. Vital signs are normal. He appears well-developed and well-nourished.  Non-toxic appearance. No distress.  Afebrile, nontoxic, NAD  HENT:  Head: Normocephalic and atraumatic.  Mouth/Throat: Mucous membranes are normal.  Eyes: Conjunctivae and EOM are normal. Right eye exhibits no discharge. Left eye exhibits no discharge.  Neck: Normal range of motion. Neck supple.  Cardiovascular: Normal rate and intact distal pulses.   Pulmonary/Chest: Effort normal. No respiratory distress.  Abdominal: Normal appearance. He exhibits no distension.  Musculoskeletal: Normal range of motion.       Right knee: He exhibits normal range of motion, no swelling, no effusion, no ecchymosis, no deformity, no laceration, no erythema, normal alignment, no LCL laxity, normal patellar mobility and no MCL laxity. Tenderness found. Medial joint line tenderness noted.  R knee with FROM intact, with mild medial joint line TTP, +audible click with PROM, no swelling/effusion/deformity, no bruising or erythema, no warmth, no abnormal alignment or patellar mobility, no varus/valgus laxity, neg anterior drawer test, no crepitus.  Strength and sensation grossly intact, distal pulses intact, compartments soft   Neurological: He is alert and oriented to person, place, and time. He has normal strength. No sensory deficit.  Skin: Skin is warm, dry and intact. No rash noted.  Psychiatric: He has a normal  mood and affect.  Nursing note and vitals reviewed.    ED Treatments / Results  Labs (all labs ordered are listed, but only abnormal results are displayed) Labs Reviewed - No data to display  EKG  EKG Interpretation None       Radiology Dg Knee Complete 4 Views Right  Result Date: 12/08/2016 CLINICAL DATA:  Twisted right knee. Popping noise heard yesterday. Initial encounter. EXAM: RIGHT KNEE - COMPLETE 4+ VIEW COMPARISON:  06/25/2016 FINDINGS: Negative for acute fracture or malalignment. No joint effusions. Mild degenerative marginal spurring without detected joint narrowing. IMPRESSION: 1. No acute finding. 2. Mild degenerative spurring. Electronically Signed   By: Marnee Spring M.D.   On: 12/08/2016 08:04    Procedures Procedures (including critical care time)  Medications Ordered in ED Medications - No data to display   Initial Impression / Assessment and Plan / ED Course  I have reviewed the triage vital signs and the nursing notes.  Pertinent labs & imaging results that were available during my  care of the patient were reviewed by me and considered in my medical decision making (see chart for details).     36 y.o. male here with R knee pain which is acute on chronic; has had issues with this knee before, suspected meniscal tear diagnosed last year based on exam. Twisted it last night getting off recliner, and felt a pop. On exam, no effusion or swelling, no bruising/erythema/warmth, mild medial joint line TTP, +audible click with ROM of knee, NVI with soft compartments. Likely meniscal tear vs ligamentous injury. Xray done in triage negative for acute findings, shows mild degenerative findings which were already known from prior xrays. Will apply knee immobilizer and give crutches, advised RICE, will rx mobic and discussed tylenol use as well, and have him f/up with ortho in 5-7 days for recheck and ongoing management; likely needs outpatient MRI. Doubt tibial plateau fx  or other occult injury, doubt need for further emergent work up at this time. I explained the diagnosis and have given explicit precautions to return to the ER including for any other new or worsening symptoms. The patient understands and accepts the medical plan as it's been dictated and I have answered their questions. Discharge instructions concerning home care and prescriptions have been given. The patient is STABLE and is discharged to home in good condition.    Final Clinical Impressions(s) / ED Diagnoses   Final diagnoses:  Sprain of right knee, unspecified ligament, initial encounter  Chronic pain of right knee    New Prescriptions New Prescriptions   MELOXICAM (MOBIC) 15 MG TABLET    Take 1 tablet (15 mg total) by mouth daily. TAKE WITH [address] [place], [place] 12/08/16 1110    Raeford Razor, MD 12/09/16 1407

## 2016-12-08 NOTE — ED Triage Notes (Signed)
Pt to ER for evaluation of right knee pain onset yesterday after getting up from recliner, states "turned inward" and heard a loud pop. Unable to bear weight this morning, significant pain with movement per patient. NAD at triage. A/o x4.

## 2016-12-08 NOTE — ED Notes (Signed)
Called ortho for knee immobilizer and crutches. 

## 2016-12-08 NOTE — ED Notes (Signed)
Patient given discharge instructions and verbalized understanding.  Patient stable to discharge at this time.  Patient is alert and oriented to baseline.  No distressed noted at this time.  All belongings taken with the patient at discharge.   

## 2016-12-08 NOTE — Discharge Instructions (Signed)
Wear knee immobilizer for at least 2 weeks for stabilization of knee. Use crutches as needed for comfort. Ice and elevate knee throughout the day, using ice pack for no more than 20 minutes every hour. Use mobic daily as directed, don't use additional NSAIDs like ibuprofen/aleve/etc while taking mobic. Use additional tylenol as needed for additional pain relief.  Follow up with your orthopedist in 5-7 days for recheck of symptoms and ongoing management of your knee pain. Return to the ER for changes or worsening symptoms.

## 2017-01-12 ENCOUNTER — Ambulatory Visit (HOSPITAL_COMMUNITY)
Admission: EM | Admit: 2017-01-12 | Discharge: 2017-01-12 | Disposition: A | Discharge status: Home / Self Care | Payer: BLUE CROSS/BLUE SHIELD | Attending: Family Medicine | Admitting: Family Medicine

## 2017-01-12 ENCOUNTER — Encounter (HOSPITAL_COMMUNITY): Payer: Self-pay | Admitting: Emergency Medicine

## 2017-01-12 DIAGNOSIS — R197 Diarrhea, unspecified: Secondary | ICD-10-CM

## 2017-01-12 DIAGNOSIS — R112 Nausea with vomiting, unspecified: Secondary | ICD-10-CM | POA: Diagnosis not present

## 2017-01-12 MED ORDER — ONDANSETRON 8 MG PO TBDP: 8.0000 mg | ORAL_TABLET | Freq: Three times a day (TID) | ORAL | 0 refills | Status: DC | PRN

## 2017-01-12 MED ORDER — METRONIDAZOLE 500 MG PO TABS: 500.0000 mg | ORAL_TABLET | Freq: Two times a day (BID) | ORAL | 0 refills | Status: DC

## 2017-01-12 NOTE — ED Provider Notes (Signed)
[facility]   [id number] 01/12/17 Arrival Time: 1000   SUBJECTIVE:  HARVE SPRADLEY is a 36 y.o. male who presents to the urgent care with complaint of abd pain with some nausea and diarrhea x 2 days, beginning yesterday morning.  No h/o gi problems.  One episode of vomiting.  Abdominal pain is left sided.  No one else sick at his home.   He went to paint ball Saturday.  No unusual foods.  No fever.  Some pink on toilet tissue after multiple trips to bathroom today.     Past Medical History:  Diagnosis Date  . ADHD (attention deficit hyperactivity disorder)    diagnosed 2012 by psychiatry, [facility]  . Allergy   . Anxiety   . Asthma   . DVT (deep venous thrombosis) (HCC)   . Gastric ulcer   . GERD (gastroesophageal reflux disease)   . Hypertension   . Low testosterone   . Migraine   . Wears glasses    Family History  Problem Relation Age of Onset  . Asthma Mother   . Ulcers Father   . Cancer Paternal Aunt   . Cancer Paternal Grandmother   . Clotting disorder Paternal Grandmother   . Clotting disorder Maternal Uncle   . Clotting disorder Cousin   . Heart disease Neg Hx   . Stroke Neg Hx    Social History   Social History  . Marital status: Married    Spouse name: N/A  . Number of children: N/A  . Years of education: N/A   Occupational History  . Not on file.   Social History Main Topics  . Smoking status: Never Smoker  . Smokeless tobacco: Never Used  . Alcohol use Yes     Comment: 2 twice a month   . Drug use: No  . Sexual activity: Not on file   Other Topics Concern  . Not on file   Social History Narrative   Married, has 4 sons   No outpatient prescriptions have been marked as taking for the 01/12/17 encounter [facility] Encounter).   No Known Allergies    ROS: As per HPI, remainder of ROS negative.   OBJECTIVE:   Vitals:   01/12/17 1027  BP: (!) 180/113  Pulse: 77  Resp: 18  Temp: 98.6 F (37 C)    TempSrc: Oral  SpO2: 100%     General appearance: alert; no distress Eyes: PERRL; EOMI; conjunctiva normal HENT: normocephalic; atraumatic; , external ears normal without trauma; nasal mucosa normal; oral mucosa normal Neck: supple Lungs: clear to auscultation bilaterally Heart: regular rate and rhythm Abdomen: soft, tender with deep palpation left abdomen; bowel sounds normal; no masses or organomegaly; no guarding or rebound tenderness Back: no CVA tenderness Extremities: no cyanosis or edema; symmetrical with no gross deformities Skin: warm and dry Neurologic: normal gait; grossly normal Psychological: alert and cooperative; normal mood and affect      Labs:  Results for orders placed or performed during the hospital encounter of 08/12/16  CBC with Differential  Result Value Ref Range   WBC 6.7 4.0 - 10.5 K/uL   RBC 4.90 4.22 - 5.81 MIL/uL   Hemoglobin 13.8 13.0 - 17.0 g/dL   HCT 78.2 95.6 - 21.3 %   MCV 85.3 78.0 - 100.0 fL   MCH 28.2 26.0 - 34.0 pg   MCHC 33.0 30.0 - 36.0 g/dL   RDW 08.6 57.8 - 46.9 %   Platelets 259 150 - 400 K/uL  Neutrophils Relative % 40 %   Neutro Abs 2.7 1.7 - 7.7 K/uL   Lymphocytes Relative 52 %   Lymphs Abs 3.5 0.7 - 4.0 K/uL   Monocytes Relative 7 %   Monocytes Absolute 0.5 0.1 - 1.0 K/uL   Eosinophils Relative 1 %   Eosinophils Absolute 0.1 0.0 - 0.7 K/uL   Basophils Relative 0 %   Basophils Absolute 0.0 0.0 - 0.1 K/uL  D-dimer, quantitative (not [facility])  Result Value Ref Range   D-Dimer, Quant <0.27 0.00 - 0.50 ug/mL-FEU  I-Stat Chem 8, ED  Result Value Ref Range   Sodium 140 135 - 145 mmol/L   Potassium 3.9 3.5 - 5.1 mmol/L   Chloride 105 101 - 111 mmol/L   BUN 13 6 - 20 mg/dL   Creatinine, Ser 1.611.00 0.61 - 1.24 mg/dL   Glucose, Bld 92 65 - 99 mg/dL   Calcium, Ion 0.961.16 0.451.15 - 1.40 mmol/L   TCO2 25 0 - 100 mmol/L   Hemoglobin 13.9 13.0 - 17.0 g/dL   HCT 40.941.0 81.139.0 - 91.452.0 %    Labs Reviewed - No data to display  No  results found.     ASSESSMENT & PLAN:  1. Nausea vomiting and diarrhea     Meds ordered this encounter  Medications  . metroNIDAZOLE (FLAGYL) 500 MG tablet    Sig: Take 1 tablet (500 mg total) by mouth 2 (two) times daily.    Dispense:  14 tablet    Refill:  0  . ondansetron (ZOFRAN-ODT) 8 MG disintegrating tablet    Sig: Take 1 tablet (8 mg total) by mouth every 8 (eight) hours as needed for nausea.    Dispense:  12 tablet    Refill:  0    Reviewed expectations re: course of current medical issues. Questions answered. Outlined signs and symptoms indicating need for more acute intervention. Patient verbalized understanding. After Visit Summary given.    Procedures:      Elvina SidleLauenstein, Natanel Snavely, MD 01/12/17 1045

## 2017-01-12 NOTE — ED Triage Notes (Signed)
Pt sts abd pain with some nausea and diarrhea x 2 days

## 2017-01-12 NOTE — Discharge Instructions (Signed)
Clear liquids and crackers only today.  Avoid milk products or greasy food.

## 2017-05-25 ENCOUNTER — Other Ambulatory Visit: Payer: Self-pay

## 2017-05-25 ENCOUNTER — Emergency Department (HOSPITAL_COMMUNITY): Payer: Self-pay

## 2017-05-25 ENCOUNTER — Emergency Department (HOSPITAL_COMMUNITY)
Admission: EM | Admit: 2017-05-25 | Discharge: 2017-05-25 | Disposition: A | Discharge status: Home / Self Care | Payer: Self-pay | Attending: Emergency Medicine | Admitting: Emergency Medicine

## 2017-05-25 ENCOUNTER — Encounter (HOSPITAL_COMMUNITY): Payer: Self-pay

## 2017-05-25 DIAGNOSIS — R112 Nausea with vomiting, unspecified: Secondary | ICD-10-CM

## 2017-05-25 DIAGNOSIS — I10 Essential (primary) hypertension: Secondary | ICD-10-CM | POA: Insufficient documentation

## 2017-05-25 DIAGNOSIS — J45909 Unspecified asthma, uncomplicated: Secondary | ICD-10-CM | POA: Insufficient documentation

## 2017-05-25 DIAGNOSIS — R0602 Shortness of breath: Secondary | ICD-10-CM

## 2017-05-25 DIAGNOSIS — J029 Acute pharyngitis, unspecified: Secondary | ICD-10-CM | POA: Insufficient documentation

## 2017-05-25 DIAGNOSIS — Z79899 Other long term (current) drug therapy: Secondary | ICD-10-CM | POA: Insufficient documentation

## 2017-05-25 DIAGNOSIS — F909 Attention-deficit hyperactivity disorder, unspecified type: Secondary | ICD-10-CM | POA: Insufficient documentation

## 2017-05-25 LAB — BASIC METABOLIC PANEL
ANION GAP: 11 (ref 5–15)
BUN: 10 mg/dL (ref 6–20)
CO2: 23 mmol/L (ref 22–32)
Calcium: 9.4 mg/dL (ref 8.9–10.3)
Chloride: 104 mmol/L (ref 101–111)
Creatinine, Ser: 1.05 mg/dL (ref 0.61–1.24)
GFR calc Af Amer: >60 mL/min (ref >60)
GLUCOSE: 113 mg/dL — AB (ref 65–99)
Potassium: 4.2 mmol/L (ref 3.5–5.1)
Sodium: 138 mmol/L (ref 135–145)

## 2017-05-25 LAB — RAPID STREP SCREEN (MED CTR MEBANE ONLY): STREPTOCOCCUS, GROUP A SCREEN (DIRECT): NEGATIVE

## 2017-05-25 LAB — CBC
HEMATOCRIT: 42.7 % (ref 39.0–52.0)
HEMOGLOBIN: 14.1 g/dL (ref 13.0–17.0)
MCH: 28.3 pg (ref 26.0–34.0)
MCHC: 33 g/dL (ref 30.0–36.0)
MCV: 85.6 fL (ref 78.0–100.0)
Platelets: 233 10*3/uL (ref 150–400)
RBC: 4.99 MIL/uL (ref 4.22–5.81)
RDW: 13.8 % (ref 11.5–15.5)
WBC: 5.4 10*3/uL (ref 4.0–10.5)

## 2017-05-25 LAB — I-STAT TROPONIN, ED
Troponin i, poc: 0 ng/mL (ref 0.00–0.08)
Troponin i, poc: 0 ng/mL (ref 0.00–0.08)

## 2017-05-25 MED ORDER — IOPAMIDOL (ISOVUE-370) INJECTION 76%
INTRAVENOUS | Status: AC
  Administered 2017-05-25: 100 mL
  Filled 2017-05-25: qty 100

## 2017-05-25 MED ORDER — KETOROLAC TROMETHAMINE 30 MG/ML IJ SOLN
30.0000 mg | Freq: Once | INTRAMUSCULAR | Status: AC
  Administered 2017-05-25: 30 mg via INTRAVENOUS
  Filled 2017-05-25: qty 1

## 2017-05-25 MED ORDER — ONDANSETRON HCL 4 MG PO TABS: 4.0000 mg | ORAL_TABLET | Freq: Three times a day (TID) | ORAL | 0 refills | Status: DC | PRN

## 2017-05-25 MED ORDER — SODIUM CHLORIDE 0.9 % IV BOLUS (SEPSIS)
1000.0000 mL | Freq: Once | INTRAVENOUS | Status: AC
  Administered 2017-05-25: 1000 mL via INTRAVENOUS

## 2017-05-25 MED ORDER — ONDANSETRON HCL 4 MG/2ML IJ SOLN
4.0000 mg | Freq: Once | INTRAMUSCULAR | Status: AC
  Administered 2017-05-25: 4 mg via INTRAVENOUS
  Filled 2017-05-25: qty 2

## 2017-05-25 MED ORDER — GI COCKTAIL ~~LOC~~
30.0000 mL | Freq: Once | ORAL | Status: AC
  Administered 2017-05-25: 30 mL via ORAL
  Filled 2017-05-25: qty 30

## 2017-05-25 NOTE — ED Provider Notes (Signed)
[facility], [place] EMERGENCY DEPARTMENT Provider Note   CSN: [id number] Arrival date & time: 05/25/17  0750     History   Chief Complaint Chief Complaint  Patient presents with  . Emesis  . Shortness of Breath    HPI Joel Wallace is a 37 y.o. male.  The history is provided by the patient and medical records. No language interpreter was used.  Shortness of Breath  This is a new problem. The average episode lasts 1 day. The problem occurs continuously.The current episode started 12 to 24 hours ago. The problem has not changed since onset.Associated symptoms include cough. Pertinent negatives include no fever, no headaches, no rhinorrhea, no neck pain, no sputum production, no hemoptysis, no wheezing, no chest pain (tightness), no vomiting, no abdominal pain, no leg pain and no leg swelling. He has tried nothing for the symptoms. Associated medical issues include asthma and DVT. Associated medical issues do not include CAD or past MI.    Past Medical History:  Diagnosis Date  . ADHD (attention deficit hyperactivity disorder)    diagnosed 2012 by psychiatry, [employer]  . Allergy   . Anxiety   . Asthma   . DVT (deep venous thrombosis) (HCC)   . Gastric ulcer   . GERD (gastroesophageal reflux disease)   . Hypertension   . Low testosterone   . Migraine   . Wears glasses     Patient Active Problem List   Diagnosis Date Noted  . Hypogonadism in male 02/27/2016  . Impaired fasting blood sugar 12/17/2015  . Low testosterone in male 12/17/2015  . Obesity with serious comorbidity 12/17/2015  . Essential hypertension 12/17/2015  . High risk medication use 12/17/2015  . History of DVT (deep vein thrombosis) 12/17/2015    Past Surgical History:  Procedure Laterality Date  . COLONOSCOPY  2007  . ESOPHAGOGASTRODUODENOSCOPY  2007       Home Medications    Prior to Admission medications   Medication Sig Start Date End Date Taking? Authorizing  Provider  metroNIDAZOLE (FLAGYL) 500 MG tablet Take 1 tablet (500 mg total) by mouth 2 (two) times daily. 01/12/17   Elvina Sidle, MD  ondansetron (ZOFRAN-ODT) 8 MG disintegrating tablet Take 1 tablet (8 mg total) by mouth every 8 (eight) hours as needed for nausea. 01/12/17   Elvina Sidle, MD    Family History Family History  Problem Relation Age of Onset  . Asthma Mother   . Ulcers Father   . Cancer Paternal Aunt   . Cancer Paternal Grandmother   . Clotting disorder Paternal Grandmother   . Clotting disorder Maternal Uncle   . Clotting disorder Cousin   . Heart disease Neg Hx   . Stroke Neg Hx     Social History Social History   Tobacco Use  . Smoking status: Never Smoker  . Smokeless tobacco: Never Used  Substance Use Topics  . Alcohol use: Yes    Comment: 2 twice a month   . Drug use: No     Allergies   Patient has no known allergies.   Review of Systems Review of Systems  Constitutional: Negative for chills, diaphoresis, fatigue and fever.  HENT: Positive for congestion. Negative for rhinorrhea.   Eyes: Negative for visual disturbance.  Respiratory: Positive for cough and shortness of breath. Negative for hemoptysis, sputum production, chest tightness and wheezing.   Cardiovascular: Negative for chest pain (tightness) and leg swelling.  Gastrointestinal: Negative for abdominal pain, constipation, diarrhea, nausea and vomiting.  Genitourinary: Negative for dysuria and flank pain.  Musculoskeletal: Negative for back pain, neck pain and neck stiffness.  Neurological: Negative for dizziness and headaches.  Psychiatric/Behavioral: Negative for agitation.  All other systems reviewed and are negative.    Physical Exam Updated Vital Signs BP (!) 141/104 (BP Location: Right Arm)   Pulse (!) 57   Temp 98.4 F (36.9 C) (Oral)   Resp 20   SpO2 100%   Physical Exam  Constitutional: He is oriented to person, place, and time. He appears well-developed and  well-nourished.  Non-toxic appearance. He does not appear ill. No distress.  HENT:  Head: Normocephalic.  Mouth/Throat: Oropharynx is clear and moist. No oropharyngeal exudate.  Eyes: EOM are normal. Pupils are equal, round, and reactive to light.  Neck: Normal range of motion. Neck supple.  Cardiovascular: Normal rate.  No murmur heard. Pulmonary/Chest: No stridor. No respiratory distress. He has no wheezes. He has no rales. He exhibits no tenderness.  Abdominal: He exhibits no distension. There is no tenderness.  Musculoskeletal: He exhibits no edema or tenderness.  Lymphadenopathy:    He has no cervical adenopathy.  Neurological: He is oriented to person, place, and time. No sensory deficit. He exhibits normal muscle tone.  Skin: Capillary refill takes less than 2 seconds. He is not diaphoretic. No erythema. No pallor.  Psychiatric: He has a normal mood and affect.  Nursing note and vitals reviewed.    ED Treatments / Results  Labs (all labs ordered are listed, but only abnormal results are displayed) Labs Reviewed  BASIC METABOLIC PANEL - Abnormal; Notable for the following components:      Result Value   Glucose, Bld 113 (*)    All other components within normal limits  RAPID STREP SCREEN (NOT AT [facility])  CULTURE, GROUP A STREP [facility])  CBC  I-STAT TROPONIN, ED  I-STAT TROPONIN, ED    EKG  EKG Interpretation  Date/Time:  Monday May 25 2017 07:56:35 EDT Ventricular Rate:  72 PR Interval:  148 QRS Duration: 90 QT Interval:  362 QTC Calculation: 396 R Axis:   71 Text Interpretation:  Normal sinus rhythm Normal ECG when compared to prior, no significant chagnes seen.  No STEMI Confirmed by Theda Belfast ([id number]) on 05/25/2017 1:21:20 PM       Radiology Dg Chest 2 View  Result Date: 05/25/2017 CLINICAL DATA:  Vomiting, shortness of breath, chest tightness. EXAM: CHEST - 2 VIEW COMPARISON:  Chest x-rays dated 08/12/2016, 06/12/2015 and 10/29/2007. FINDINGS: Heart  size and mediastinal contours are stable. Lungs are clear. No pleural effusion or pneumothorax seen. No acute or suspicious osseous finding. IMPRESSION: No active cardiopulmonary disease. No evidence of pneumonia or pulmonary edema. Electronically Signed   By: Bary Richard M.D.   On: 05/25/2017 09:01   Ct Angio Chest Pe W And/or Wo Contrast  Result Date: 05/25/2017 CLINICAL DATA:  Chest pain short of breath. Suspect pulmonary embolism EXAM: CT ANGIOGRAPHY CHEST WITH CONTRAST TECHNIQUE: Multidetector CT imaging of the chest was performed using the standard protocol during bolus administration of intravenous contrast. Multiplanar CT image reconstructions and MIPs were obtained to evaluate the vascular anatomy. CONTRAST:  ISOVUE-370 IOPAMIDOL (ISOVUE-370) INJECTION 76% COMPARISON:  Chest two-view 05/25/2017 FINDINGS: Cardiovascular: Negative for pulmonary embolism. Pulmonary arteries normal in caliber. Heart size normal. No pericardial effusion. Thoracic aorta normal in caliber. Minimal contrast in the aorta due to timing of the scan Mediastinum/Nodes: Negative for mass or adenopathy Lungs/Pleura: Negative Upper Abdomen: Negative Musculoskeletal: Negative Review  of the MIP images confirms the above findings. IMPRESSION: Negative for pulmonary embolism.  Negative CTA chest Electronically Signed   By: Marlan Palauharles  Clark M.D.   On: 05/25/2017 14:52    Procedures Procedures (including critical care time)  Medications Ordered in ED Medications  sodium chloride 0.9 % bolus 1,000 mL (1,000 mLs Intravenous New Bag/Given 05/25/17 1524)  ondansetron (ZOFRAN) injection 4 mg (4 mg Intravenous Given 05/25/17 1524)  iopamidol (ISOVUE-370) 76 % injection (100 mLs  Contrast Given 05/25/17 1443)  gi cocktail (Maalox,Lidocaine,Donnatal) (30 mLs Oral Given 05/25/17 1535)  ketorolac (TORADOL) 30 MG/ML injection 30 mg (30 mg Intravenous Given 05/25/17 1535)     Initial Impression / Assessment and Plan / ED Course  I have  reviewed the triage vital signs and the nursing notes.  Pertinent labs & imaging results that were available during my care of the patient were reviewed by me and considered in my medical decision making (see chart for details).     Joel CureRandy Hutto Jr. is a 37 y.o. male with past medical history significant for hypertension, asthma, migraines, GERD, and prior DVT who presents with nausea, vomiting, sore throat, and shortness of breath.  Patient reports that he was having chest tightness when he is taking a deep breath or coughing.  Patient says that this is been going on today but he was having the nausea and vomiting yesterday as well.  He says that his sore throat is mild to moderate.  He denies fevers, chills, cuts patient, diarrhea, or dysuria.  He does report history of GERD.  He says that his DVT was several years ago and he is been off anticoagulant for some time.  He never had a history of PE.  On exam, patient looks clear.  Chest is nontender.  Abdomen is nontender.  Patient symmetric pulses in all extremities.  No significant lower cavity edema or unilateral swelling or tenderness.    EKG showed evidence of STEMI or significant change from prior.  Neck  Patient's chest x-ray is overall unremarkable.    Given the patient's history of DVT with his pleuritic chest tightness and shortness of breath, a PET scan was ordered.  CT PE study showed evidence of pulmonary embolism or significant thoracic abnormality.  CBC and BMP also reassuring.  Heart score calculated is a 3.  Troponin negative x2.  Patient will have rapid strep to look for strep throat however, patient was also given pain medication and GI cocktail.  Patient reports his symptoms have felt much better and he is feels nauseous or having shortness of breath.   Anticipate discharge for outpatient management of his likely muscle skeletal chest pain in the setting of the congestion, sore throat, and cough.                       Strep  swab was negative.  Patient was reassessed and he was feeling much better.  Patient given prescription for Zofran for his nausea.  Patient will follow up with his PCP in several days and understood return precautions for new or worsened symptoms.  With his negative PE study, low risk heart score and negative troponins, and improved appearance, patient will be discharged for outpatient management.  Patient had no other questions or concerns and was discharged in good condition.   Final Clinical Impressions(s) / ED Diagnoses   Final diagnoses:  Non-intractable vomiting with nausea, unspecified vomiting type  Shortness of breath  Sore throat  ED Discharge Orders        Ordered    ondansetron (ZOFRAN) 4 MG tablet  Every 8 hours PRN     05/25/17 1702      Clinical Impression: 1. Non-intractable vomiting with nausea, unspecified vomiting type   2. Shortness of breath   3. Sore throat     Disposition: Discharge  Condition: Good  I have discussed the results, Dx and Tx plan with the pt(& family if present). He/she/they expressed understanding and agree(s) with the plan. Discharge instructions discussed at great length. Strict return precautions discussed and pt &/or family have verbalized understanding of the instructions. No further questions at time of discharge.    New Prescriptions   ONDANSETRON (ZOFRAN) 4 MG TABLET    Take 1 tablet (4 mg total) by mouth every 8 (eight) hours as needed for nausea or vomiting.    Follow Up: [facility] [address] [place] [address] [phone] Schedule an appointment as soon as possible for a visit    [facility] EMERGENCY DEPARTMENT [address] [id number] mc [place] [address] [phone]       Atreus Hasz, Canary Brim, MD 05/25/17 1719

## 2017-05-25 NOTE — Discharge Instructions (Signed)
Your workup today was overall reassuring with no evidence of bacterial infection, cardiac cause of your symptoms, or blood clot.  Please follow-up with your primary care physician for further management.  Please use the nausea medicine to help with your nausea and vomiting and to stay hydrated.  If any symptoms change or worsen, please return to the nearest emergency department.

## 2017-05-25 NOTE — ED Notes (Signed)
Pt requesting meds for throat pain.  States his throat pain concerns him more than his chest pain.

## 2017-05-25 NOTE — ED Triage Notes (Signed)
Pt states this morning he was vomiting and had shortness of breath. Pt states he has not been feeling well X1 month. Pt alert and oriented. No acute distress. Reports chest pain with coughing or deep breathing.

## 2017-05-28 LAB — CULTURE, GROUP A STREP (THRC)

## 2018-04-22 ENCOUNTER — Emergency Department (HOSPITAL_COMMUNITY): Payer: Self-pay

## 2018-04-22 ENCOUNTER — Encounter (HOSPITAL_COMMUNITY): Payer: Self-pay | Admitting: Emergency Medicine

## 2018-04-22 ENCOUNTER — Emergency Department (HOSPITAL_COMMUNITY)
Admission: EM | Admit: 2018-04-22 | Discharge: 2018-04-22 | Disposition: A | Discharge status: Home / Self Care | Payer: Self-pay | Attending: Emergency Medicine | Admitting: Emergency Medicine

## 2018-04-22 DIAGNOSIS — R062 Wheezing: Secondary | ICD-10-CM | POA: Insufficient documentation

## 2018-04-22 DIAGNOSIS — F909 Attention-deficit hyperactivity disorder, unspecified type: Secondary | ICD-10-CM | POA: Insufficient documentation

## 2018-04-22 DIAGNOSIS — J101 Influenza due to other identified influenza virus with other respiratory manifestations: Secondary | ICD-10-CM | POA: Insufficient documentation

## 2018-04-22 DIAGNOSIS — Z79899 Other long term (current) drug therapy: Secondary | ICD-10-CM | POA: Insufficient documentation

## 2018-04-22 DIAGNOSIS — I10 Essential (primary) hypertension: Secondary | ICD-10-CM | POA: Insufficient documentation

## 2018-04-22 LAB — INFLUENZA PANEL BY PCR (TYPE A & B)
INFLBPCR: NEGATIVE
Influenza A By PCR: POSITIVE — AB

## 2018-04-22 MED ORDER — IPRATROPIUM-ALBUTEROL 0.5-2.5 (3) MG/3ML IN SOLN
3.0000 mL | Freq: Once | RESPIRATORY_TRACT | Status: AC
  Administered 2018-04-22: 3 mL via RESPIRATORY_TRACT
  Filled 2018-04-22: qty 3

## 2018-04-22 MED ORDER — ACETAMINOPHEN 500 MG PO TABS
1000.0000 mg | ORAL_TABLET | Freq: Once | ORAL | Status: AC
  Administered 2018-04-22: 1000 mg via ORAL
  Filled 2018-04-22: qty 2

## 2018-04-22 MED ORDER — ALBUTEROL SULFATE 108 (90 BASE) MCG/ACT IN AEPB: 2.0000 | INHALATION_SPRAY | RESPIRATORY_TRACT | 0 refills | Status: DC

## 2018-04-22 NOTE — ED Provider Notes (Signed)
[facility] EMERGENCY DEPARTMENT Provider Note   CSN: [id number] Arrival date & time: 04/22/18  0610     History   Chief Complaint Chief Complaint  Patient presents with  . Fever  . URI    HPI Joel Wallace. is a 38 y.o. male who presents emergency department chief complaint of flulike symptoms.  Patient complains of 3 days of fever, body aches, chills, mild headache, productive cough, feeling somewhat tight and short of breath in his chest.  He denies history of smoking.  He has had sick contacts at work.  No foreign travel.  Patient did not have flu shot this year.  HPI  Past Medical History:  Diagnosis Date  . ADHD (attention deficit hyperactivity disorder)    diagnosed 2012 by psychiatry, [employer]  . Allergy   . Anxiety   . Asthma   . DVT (deep venous thrombosis) (HCC)   . Gastric ulcer   . GERD (gastroesophageal reflux disease)   . Hypertension   . Low testosterone   . Migraine   . Wears glasses     Patient Active Problem List   Diagnosis Date Noted  . Hypogonadism in male 02/27/2016  . Impaired fasting blood sugar 12/17/2015  . Low testosterone in male 12/17/2015  . Obesity with serious comorbidity 12/17/2015  . Essential hypertension 12/17/2015  . High risk medication use 12/17/2015  . History of DVT (deep vein thrombosis) 12/17/2015    Past Surgical History:  Procedure Laterality Date  . COLONOSCOPY  2007  . ESOPHAGOGASTRODUODENOSCOPY  2007        Home Medications    Prior to Admission medications   Medication Sig Start Date End Date Taking? Authorizing Provider  acetaminophen (TYLENOL) 500 MG tablet Take 1,000 mg by mouth every 6 (six) hours as needed for mild pain.    [provider]  albuterol (PROVENTIL HFA;VENTOLIN HFA) 108 (90 Base) MCG/ACT inhaler Inhale 2 puffs into the lungs every 6 (six) hours as needed for wheezing or shortness of breath.    [provider]  Multiple  Vitamins-Minerals (MEGA MULTI MEN) TABS Take 1 tablet by mouth daily.    [provider]  ondansetron (ZOFRAN) 4 MG tablet Take 1 tablet (4 mg total) by mouth every 8 (eight) hours as needed for nausea or vomiting. 05/25/17   Tegeler, Canary Brim, MD    Family History Family History  Problem Relation Age of Onset  . Asthma Mother   . Ulcers Father   . Cancer Paternal Aunt   . Cancer Paternal Grandmother   . Clotting disorder Paternal Grandmother   . Clotting disorder Maternal Uncle   . Clotting disorder Cousin   . Heart disease Neg Hx   . Stroke Neg Hx     Social History Social History   Tobacco Use  . Smoking status: Never Smoker  . Smokeless tobacco: Never Used  Substance Use Topics  . Alcohol use: Yes    Comment: 2 twice a month   . Drug use: No     Allergies   Patient has no known allergies.   Review of Systems Review of Systems Ten systems reviewed and are negative for acute change, except as noted in the HPI.   Physical Exam Updated Vital Signs BP (!) 145/109 (BP Location: Right Arm)   Pulse (!) 124   Temp (!) 100.4 F (38 C) (Oral)   Resp (!) 22   Ht 5\' 9"  (1.753 m)   Wt 127 kg  SpO2 96%   BMI 41.35 kg/m   Physical Exam Vitals signs and nursing note reviewed.  Constitutional:      General: He is not in acute distress.    Appearance: He is well-developed. He is ill-appearing. He is not toxic-appearing or diaphoretic.  HENT:     Head: Normocephalic and atraumatic.  Eyes:     General: No scleral icterus.    Conjunctiva/sclera: Conjunctivae normal.  Neck:     Musculoskeletal: Normal range of motion and neck supple.  Cardiovascular:     Rate and Rhythm: Normal rate and regular rhythm.     Heart sounds: Normal heart sounds.  Pulmonary:     Effort: Pulmonary effort is normal. No respiratory distress.     Breath sounds: Wheezing (clear with cough) and rhonchi present.     Comments: Slight wheeze with his cough  Abdominal:      Palpations: Abdomen is soft.     Tenderness: There is no abdominal tenderness.  Skin:    General: Skin is warm and dry.  Neurological:     Mental Status: He is alert.  Psychiatric:        Behavior: Behavior normal.      ED Treatments / Results  Labs (all labs ordered are listed, but only abnormal results are displayed) Labs Reviewed  INFLUENZA PANEL BY PCR (TYPE A & B)    EKG None  Radiology No results found.  Procedures Procedures (including critical care time)  Medications Ordered in ED Medications - No data to display   Initial Impression / Assessment and Plan / ED Course  I have reviewed the triage vital signs and the nursing notes.  Pertinent labs & imaging results that were available during my care of the patient were reviewed by me and considered in my medical decision making (see chart for details).    Patient with symptoms consistent with influenza.  Vitals are stable, low-grade fever.  No signs of dehydration, tolerating PO's.  Lungs are clear.  Personally reviewed the patient's chest x-ray which shows no focal consolidation or evidence of pneumonia  The patient understands that symptoms are greater than the recommended 24-48 hour window of treatment and he will not receive antiviral medication.  Patient does have some reactive airway and will be treated with albuterol.  Patient will be discharged with instructions to orally hydrate, rest, and use over-the-counter medications such as anti-inflammatories ibuprofen and Aleve for muscle aches and Tylenol for fever.  Patient will also be given a cough suppressant.   Final Clinical Impressions(s) / ED Diagnoses   Final diagnoses:  None    ED Discharge Orders    None       Arthor Captain, PA-C 04/23/18 1006    Sabas Sous, MD 04/28/18 0025

## 2018-04-22 NOTE — Discharge Instructions (Addendum)

## 2018-04-22 NOTE — ED Triage Notes (Signed)
  Patient comes in with fever and cold symptoms that have been going on for 3 days.  Patient states his fever has been 102.3 at home and he has been taking Robitussin, last dose at midnight.  Patient states he has a headache and feels SOB.  Pain 8/10.  Patient A&O x4

## 2018-10-12 ENCOUNTER — Telehealth: Payer: Self-pay | Admitting: Medical

## 2018-10-12 NOTE — Telephone Encounter (Signed)
Dismissal letter in guarantor snapshot  °

## 2018-10-20 ENCOUNTER — Other Ambulatory Visit: Payer: Self-pay

## 2018-10-20 ENCOUNTER — Telehealth (INDEPENDENT_AMBULATORY_CARE_PROVIDER_SITE_OTHER): Payer: Managed Care, Other (non HMO) | Admitting: Primary Care

## 2018-10-20 ENCOUNTER — Encounter (INDEPENDENT_AMBULATORY_CARE_PROVIDER_SITE_OTHER): Payer: Self-pay | Admitting: Primary Care

## 2018-10-20 DIAGNOSIS — G4700 Insomnia, unspecified: Secondary | ICD-10-CM | POA: Diagnosis not present

## 2018-10-20 DIAGNOSIS — Z7689 Persons encountering health services in other specified circumstances: Secondary | ICD-10-CM

## 2018-10-20 DIAGNOSIS — I10 Essential (primary) hypertension: Secondary | ICD-10-CM | POA: Diagnosis not present

## 2018-10-20 DIAGNOSIS — F411 Generalized anxiety disorder: Secondary | ICD-10-CM

## 2018-10-20 NOTE — Progress Notes (Signed)
Virtual Visit via Telephone Note  I connected with Joel Wallace. on 10/20/18 at  2:50 PM EDT by telephone and verified that I am speaking with the correct person using two identifiers.   I discussed the limitations, risks, security and privacy concerns of performing an evaluation and management service by telephone and the availability of in person appointments. I also discussed with the patient that there may be a patient responsible charge related to this service. The patient expressed understanding and agreed to proceed.   History of Present Illness: [name] to establish care with a new primary . Reviewed his chart and was dismissed from Joel Wallace with a note in his snap shot I am unable to retrieve this information. He voices he has been under a lot of stress and anxiety with difficulty sleeping.  Past Medical History:  Diagnosis Date  . ADHD (attention deficit hyperactivity disorder)    diagnosed 2012 by psychiatry, [employer]  . Allergy   . Anxiety   . Asthma   . DVT (deep venous thrombosis) ([place])   . Gastric ulcer   . GERD (gastroesophageal reflux disease)   . Hypertension   . Low testosterone   . Migraine   . Wears glasses    Observations/Objective: Review of Systems  Constitutional: Negative.   Psychiatric/Behavioral: Positive for depression. The patient is nervous/anxious and has insomnia.   All other systems reviewed and are negative.  Assessment and Plan: Joel Wallace was seen today for new patient (initial visit).  Diagnoses and all orders for this visit:  Encounter to establish care Establish care with new provider. Previously followed Joel Wallace .  Essential hypertension Counseled on blood pressure goal of less than 130/80, low-sodium, DASH diet, medication compliance, 150 minutes of moderate intensity exercise per week. Discussed medication compliance, adverse effects. He is not on any medication for  hypertension follow up visit will be seen in person and evaluate Bp  Insomnia, unspecified type   Difficulty going to sleep:   Risk factors/sleep hygiene:    Stimulants:    Alcohol intake:    Reading, watching TV, eating @ bedtime:    Daytime naps:    Stress/anxiety: Call  in Trazodone   Generalized anxiety disorder  We discussed options for treatment of anxiety including therapy and/or medication.  Will check basic labs to ensure thyroid is in normal range and that no other metabolic issues are obvious.  Reviewed concept of anxiety as biochemical imbalance of neurotransmitters and rationale for treatment. Discussed potential risks, expected benefits, possible side effects of the medicine. We also discussed how to take it correctly and dosing instructions. If he has any significant side effects to the medicine, he is to stop it and call for advice.  Instructed patient to contact office or on-call physician promptly should condition worsen or any new symptoms appear.   Called in Buspar 5mg  BID prn    Follow Up Instructions:    I discussed the assessment and treatment plan with the patient. The patient was provided an opportunity to ask questions and all were answered. The patient agreed with the plan and demonstrated an understanding of the instructions.   The patient was advised to call back or seek an in-person evaluation if the symptoms worsen or if the condition fails to improve as anticipated.  I provided 18 minutes of face-to-face time during this encounter. WEB visit   Kerin Perna, NP

## 2018-10-20 NOTE — Progress Notes (Signed)
Virtual Visit via Telephone Note  I connected with Joel Wallace. on 10/20/18 at  2:50 PM EDT by telephone and verified that I am speaking with the correct person using two identifiers.   I discussed the limitations, risks, security and privacy concerns of performing an evaluation and management service by telephone and the availability of in person appointments. I also discussed with the patient that there may be a patient responsible charge related to this service. The patient expressed understanding and agreed to proceed.   History of Present Illness: [name] is establishing care via web. He does not have a PCP. He has several visit to the emergency room for various medical issues -nausea and vomiting. Previously discharged from [facility] unable to locate dismissal letter. His main problems are hypertension- he denies shortness of breath, headaches, chest pain or lower extremity edema., anxiety and insomnia. Past Medical History:  Diagnosis Date  . ADHD (attention deficit hyperactivity disorder)    diagnosed 2012 by psychiatry, [employer]  . Allergy   . Anxiety   . Asthma   . DVT (deep venous thrombosis) ([place])   . Gastric ulcer   . GERD (gastroesophageal reflux disease)   . Hypertension   . Low testosterone   . Migraine   . Wears glasses    Observations/Objective: Review of Systems  Constitutional: Positive for weight loss.       Intentional   Cardiovascular: Positive for palpitations.  Neurological: Positive for headaches.  Psychiatric/Behavioral: The patient is nervous/anxious and has insomnia.     Assessment and Plan: [place] was seen today for new patient (initial visit).  Diagnoses and all orders for this visit:  Encounter to establish care Establish of care with new provider addressed all questions and concerns. Started on Buspar for anxiety and trazodone for insomnia. Return in 4 weeks in person to evaluate effective of  medications.   Essential hypertension Counseled on blood pressure goal of less than 130/80, low-sodium, DASH diet, medication compliance, 150 minutes of moderate intensity exercise per week. Discussed medication compliance, adverse effects.  Insomnia, unspecified type Can try melatonin 5mg -15 mg at night for sleep, can also do benadryl 25-50mg  at night for sleep.  If this does not help we can try prescription medication.  Also here is some information about good sleep hygiene.   Insomnia Insomnia is frequent trouble falling and/or staying asleep. Insomnia can be a long term problem or a short term problem. Both are common. Insomnia can be a short term problem when the wakefulness is related to a certain stress or worry. Long term insomnia is often related to ongoing stress during waking hours and/or poor sleeping habits. Overtime, sleep deprivation itself can make the problem worse. Every little thing feels more severe because you are overtired and your ability to cope is decreased. CAUSES   Stress, anxiety, and depression.  Poor sleeping habits.  Distractions such as TV in the bedroom.  Naps close to bedtime.  Engaging in emotionally charged conversations before bed.  Technical reading before sleep.  Alcohol and other sedatives. They may make the problem worse. They can hurt normal sleep patterns and normal dream activity.  Stimulants such as caffeine for several hours prior to bedtime.  Pain syndromes and shortness of breath can cause insomnia.  Exercise late at night.  Changing time zones may cause sleeping problems (jet lag). It is sometimes helpful to have someone observe your sleeping patterns. They should look for periods of not breathing during the  night (sleep apnea). They should also look to see how long those periods last. If you live alone or observers are uncertain, you can also be observed at a sleep clinic where your sleep patterns will be professionally monitored.  Sleep apnea requires a checkup and treatment. Give your caregivers your medical history. Give your caregivers observations your family has made about your sleep.  SYMPTOMS   Not feeling rested in the morning.  Anxiety and restlessness at bedtime.  Difficulty falling and staying asleep. TREATMENT   Your caregiver may prescribe treatment for an underlying medical disorders. Your caregiver can give advice or help if you are using alcohol or other drugs for self-medication. Treatment of underlying problems will usually eliminate insomnia problems.  Medications can be prescribed for short time use. They are generally not recommended for lengthy use.  Over-the-counter sleep medicines are not recommended for lengthy use. They can be habit forming.  You can promote easier sleeping by making lifestyle changes such as:  Using relaxation techniques that help with breathing and reduce muscle tension.  Exercising earlier in the day.  Changing your diet and the time of your last meal. No night time snacks.  Establish a regular time to go to bed.  Counseling can help with stressful problems and worry.  Soothing music and white noise may be helpful if there are background noises you cannot remove.  Stop tedious detailed work at least one hour before bedtime. HOME CARE INSTRUCTIONS   Keep a diary. Inform your caregiver about your progress. This includes any medication side effects. See your caregiver regularly. Take note of:  Times when you are asleep.  Times when you are awake during the night.  The quality of your sleep.  How you feel the next day. This information will help your caregiver care for you.  Get out of bed if you are still awake after 15 minutes. Read or do some quiet activity. Keep the lights down. Wait until you feel sleepy and go back to bed.  Keep regular sleeping and waking hours. Avoid naps.  Exercise regularly.  Avoid distractions at bedtime. Distractions  include watching television or engaging in any intense or detailed activity like attempting to balance the household checkbook.  Develop a bedtime ritual. Keep a familiar routine of bathing, brushing your teeth, climbing into bed at the same time each night, listening to soothing music. Routines increase the success of falling to sleep faster.  Use relaxation techniques. This can be using breathing and muscle tension release routines. It can also include visualizing peaceful scenes. You can also help control troubling or intruding thoughts by keeping your mind occupied with boring or repetitive thoughts like the old concept of counting sheep. You can make it more creative like imagining planting one beautiful flower after another in your backyard garden.  During your day, work to eliminate stress. When this is not possible use some of the previous suggestions to help reduce the anxiety that accompanies stressful situations. MAKE SURE YOU:   Understand these instructions.  Will watch your condition.  Will get help right away if you are not doing well or get worse. Document Released: 02/29/2000 Document Revised: 05/26/2011 Document Reviewed: 03/31/2007 [facility]ExitCare Patient Information 2015 [place]. This information is not intended to replace advice given to you by your health care provider. Make sure you discuss any questions you have with your health care provider.  Generalized anxiety disorder  We discussed options for treatment of anxiety including therapy and/or medication.  Will check basic labs to ensure thyroid is in normal range and that no other metabolic issues are obvious.  Reviewed concept of anxiety as biochemical imbalance of neurotransmitters and rationale for treatment. Discussed potential risks, expected benefits, possible side effects of the medicine. We also discussed how to take it correctly and dosing instructions. If he has any significant side effects to the medicine,  he is to stop it and call for advice.  Instructed patient to contact office or on-call physician promptly should condition worsen or any new symptoms appear.    He was agreeable with this plan.   Follow Up Instructions:    I discussed the assessment and treatment plan with the patient. The patient was provided an opportunity to ask questions and all were answered. The patient agreed with the plan and demonstrated an understanding of the instructions.   The patient was advised to call back or seek an in-person evaluation if the symptoms worsen or if the condition fails to improve as anticipated.  I provided 26 minutes of non-face-to-face time during this encounter.   Grayce SessionsMichelle P Kalim Kissel, NP

## 2018-10-22 ENCOUNTER — Encounter (INDEPENDENT_AMBULATORY_CARE_PROVIDER_SITE_OTHER): Payer: Self-pay | Admitting: Primary Care

## 2018-12-01 ENCOUNTER — Encounter (INDEPENDENT_AMBULATORY_CARE_PROVIDER_SITE_OTHER): Payer: Self-pay | Admitting: Primary Care

## 2018-12-01 ENCOUNTER — Other Ambulatory Visit: Payer: Self-pay

## 2018-12-01 ENCOUNTER — Ambulatory Visit (INDEPENDENT_AMBULATORY_CARE_PROVIDER_SITE_OTHER): Payer: Managed Care, Other (non HMO) | Admitting: Primary Care

## 2018-12-01 VITALS — BP 149/103 | HR 70 | Temp 97.2°F | Ht 69.0 in | Wt 262.6 lb

## 2018-12-01 DIAGNOSIS — E66812 Obesity, class 2: Secondary | ICD-10-CM

## 2018-12-01 DIAGNOSIS — Z23 Encounter for immunization: Secondary | ICD-10-CM

## 2018-12-01 DIAGNOSIS — E6609 Other obesity due to excess calories: Secondary | ICD-10-CM | POA: Diagnosis not present

## 2018-12-01 DIAGNOSIS — Z114 Encounter for screening for human immunodeficiency virus [HIV]: Secondary | ICD-10-CM

## 2018-12-01 DIAGNOSIS — I10 Essential (primary) hypertension: Secondary | ICD-10-CM

## 2018-12-01 DIAGNOSIS — F411 Generalized anxiety disorder: Secondary | ICD-10-CM | POA: Diagnosis not present

## 2018-12-01 DIAGNOSIS — Z6838 Body mass index (BMI) 38.0-38.9, adult: Secondary | ICD-10-CM

## 2018-12-01 DIAGNOSIS — G4700 Insomnia, unspecified: Secondary | ICD-10-CM | POA: Diagnosis not present

## 2018-12-01 DIAGNOSIS — R7989 Other specified abnormal findings of blood chemistry: Secondary | ICD-10-CM

## 2018-12-01 MED ORDER — TRAZODONE HCL 50 MG PO TABS: 50.0000 mg | ORAL_TABLET | ORAL | 3 refills | Status: DC | PRN

## 2018-12-01 MED ORDER — BUSPIRONE HCL 5 MG PO TABS: 5.0000 mg | ORAL_TABLET | Freq: Two times a day (BID) | ORAL | 3 refills | Status: DC

## 2018-12-01 MED ORDER — AMLODIPINE BESYLATE 10 MG PO TABS: 10.0000 mg | ORAL_TABLET | Freq: Every day | ORAL | 0 refills | Status: DC

## 2018-12-01 MED ORDER — BLOOD PRESSURE MONITOR KIT: 1.0000 | PACK | Freq: Two times a day (BID) | 0 refills | Status: DC

## 2018-12-01 NOTE — Patient Instructions (Signed)

## 2018-12-01 NOTE — Progress Notes (Signed)
Established Patient Office Visit  Subjective:  Patient ID: Joel Lederman., male    DOB: [birth year]  Age: 38 y.o. MRN: [id number]  CC: No chief complaint on file.   HPI Joel Wallace. presents for management of chronic disease and medication refills.  Past Medical History:  Diagnosis Date  . ADHD (attention deficit hyperactivity disorder)    diagnosed 2012 by psychiatry, [employer]  . Allergy   . Anxiety   . Asthma   . DVT (deep venous thrombosis) ([place])   . Gastric ulcer   . GERD (gastroesophageal reflux disease)   . Hypertension   . Low testosterone   . Migraine   . Wears glasses     Past Surgical History:  Procedure Laterality Date  . COLONOSCOPY  2007  . ESOPHAGOGASTRODUODENOSCOPY  2007    Family History  Problem Relation Age of Onset  . Asthma Mother   . Ulcers Father   . Cancer Paternal Aunt   . Cancer Paternal Grandmother   . Clotting disorder Paternal Grandmother   . Clotting disorder Maternal Uncle   . Clotting disorder Cousin   . Heart disease Neg Hx   . Stroke Neg Hx     Social History   Socioeconomic History  . Marital status: Married    Spouse name: Not on file  . Number of children: Not on file  . Years of education: Not on file  . Highest education level: Not on file  Occupational History  . Not on file  Social Needs  . Financial resource strain: Not on file  . Food insecurity    Worry: Not on file    Inability: Not on file  . Transportation needs    Medical: Not on file    Non-medical: Not on file  Tobacco Use  . Smoking status: Never Smoker  . Smokeless tobacco: Never Used  Substance and Sexual Activity  . Alcohol use: Yes    Comment: 2 twice a month   . Drug use: No  . Sexual activity: Not on file  Lifestyle  . Physical activity    Days per week: Not on file    Minutes per session: Not on file  . Stress: Not on file  Relationships  . Social Herbalist on phone: Not on file    Gets  together: Not on file    Attends religious service: Not on file    Active member of club or organization: Not on file    Attends meetings of clubs or organizations: Not on file    Relationship status: Not on file  . Intimate partner violence    Fear of current or ex partner: Not on file    Emotionally abused: Not on file    Physically abused: Not on file    Forced sexual activity: Not on file  Other Topics Concern  . Not on file  Social History Narrative   Married, has 4 sons    Outpatient Medications Prior to Visit  Medication Sig Dispense Refill  . albuterol (PROVENTIL HFA;VENTOLIN HFA) 108 (90 Base) MCG/ACT inhaler Inhale 2 puffs into the lungs every 6 (six) hours as needed for wheezing or shortness of breath.    . Multiple Vitamins-Minerals (MEGA MULTI MEN) TABS Take 1 tablet by mouth daily.    . busPIRone (BUSPAR) 5 MG tablet Take 5 mg by mouth 2 (two) times daily.    . traZODone (DESYREL) 50 MG tablet Take 50 mg by mouth as  needed.    . Albuterol Sulfate (PROAIR RESPICLICK) 160 (90 Base) MCG/ACT AEPB Inhale 2 puffs into the lungs every 4 (four) hours while awake. (Patient not taking: Reported on 10/20/2018) 1 each 0   No facility-administered medications prior to visit.     No Known Allergies  ROS Review of Systems  Constitutional: Positive for fatigue.  All other systems reviewed and are negative.     Objective:    Physical Exam  Constitutional: He is oriented to person, place, and time. He appears well-developed and well-nourished.  HENT:  Head: Normocephalic.  Eyes: Pupils are equal, round, and reactive to light. EOM are normal.  Neck: Normal range of motion. Neck supple.  Cardiovascular: Normal rate and regular rhythm.  Pulmonary/Chest: Effort normal and breath sounds normal.  Abdominal: Soft. Bowel sounds are normal. He exhibits distension.  Musculoskeletal: Normal range of motion.  Neurological: He is oriented to person, place, and time.  Skin: Skin is warm.   Psychiatric: He has a normal mood and affect.    BP (!) 149/103 (BP Location: Right Arm, Patient Position: Sitting, Cuff Size: Large)   Pulse 70   Temp (!) 97.2 F (36.2 C) (Tympanic)   Ht '5\' 9"'$  (1.753 m)   Wt 262 lb 9.6 oz (119.1 kg)   SpO2 98%   BMI 38.78 kg/m  Wt Readings from Last 3 Encounters:  12/01/18 262 lb 9.6 oz (119.1 kg)  04/22/18 280 lb (127 kg)  07/09/16 279 lb (126.6 kg)     Health Maintenance Due  Topic Date Due  . HIV Screening  01/31/1997  . TETANUS/TDAP  01/31/2001  . INFLUENZA VACCINE  10/16/2018    There are no preventive care reminders to display for this patient.  Lab Results  Component Value Date   TSH 2.17 12/17/2015   Lab Results  Component Value Date   WBC 5.4 05/25/2017   HGB 14.1 05/25/2017   HCT 42.7 05/25/2017   MCV 85.6 05/25/2017   PLT 233 05/25/2017   Lab Results  Component Value Date   NA 138 05/25/2017   K 4.2 05/25/2017   CO2 23 05/25/2017   GLUCOSE 113 (H) 05/25/2017   BUN 10 05/25/2017   CREATININE 1.05 05/25/2017   BILITOT 0.7 06/24/2016   ALKPHOS 47 06/24/2016   AST 45 (H) 06/24/2016   ALT 40 06/24/2016   PROT 7.3 06/24/2016   ALBUMIN 4.8 06/24/2016   CALCIUM 9.4 05/25/2017   ANIONGAP 11 05/25/2017     Assessment & Plan:  .Diagnoses and all orders for this visit:  Need for Tdap vaccination Tdap is recommended every 10 years for adults weekly or primary she gets tetanus..  At least 1 of those doses should be with Tdap in adults age 45 and older who have previously received Tdap.  Recommend by the CDC. -     Tdap vaccine greater than or equal to 7yo IM  Essential hypertension Counseled on blood pressure goal of less than 130/80, low-sodium, DASH diet, medication compliance, 150 minutes of moderate intensity exercise per week. Discussed medication compliance, adverse effects. -     CBC with Differential -     CMP14+EGFR -     amLODipine (NORVASC) 10 MG tablet; Take 1 tablet (10 mg total) by mouth daily. -      Blood Pressure Monitor KIT; 1 Bag by Does not apply route 2 times daily at 12 noon and 4 pm.  Generalized anxiety disorder  We discussed options for treatment of anxiety including  therapy and/or medication.  Will check basic labs to ensure thyroid is in normal range and that no other metabolic issues are obvious.  Reviewed concept of anxiety as biochemical imbalance of neurotransmitters and rationale for treatment. Discussed potential risks, expected benefits, possible side effects of the medicine. We also discussed how to take it correctly and dosing instructions. If he has any significant side effects to the medicine, he is to stop it and call for advice.  Instructed patient to contact office or on-call physician promptly should condition worsen or any new symptoms appear.    He was agreeable with this plan.    busPIRone (BUSPAR) 5 MG tablet; Take 1 tablet (5 mg total) by mouth 2 (two) times daily.  Insomnia, unspecified type Insomnia is frequent trouble falling and/or staying asleep. Insomnia can be a long term problem or a short term problem. Both are common. Insomnia can be a short term problem when the wakefulness is related to a certain stress or worry. Long term insomnia is often related to ongoing stress during waking hours and/or poor sleeping habits. Overtime, sleep deprivation itself can make the problem worse. Every little thing feels more severe because you are overtired and your ability to cope is decreased. CAUSES   Stress, anxiety, and depression.  Poor sleeping habits.  Distractions such as TV in the bedroom.  Naps close to bedtime.  Engaging in emotionally charged conversations before bed.  Technical reading before sleep.  Alcohol and other sedatives. They may make the problem worse. They can hurt normal sleep patterns and normal dream activity.  Stimulants such as caffeine for several hours prior to bedtime.  Pain syndromes and shortness of breath can cause  insomnia.  Exercise late at night.  Changing time zones may cause sleeping problems (jet lag). It is sometimes helpful to have someone observe your sleeping patterns. They should look for periods of not breathing during the night (sleep apnea). They should also look to see how long those periods last. If you live alone or observers are uncertain, you can also be observed at a sleep clinic where your sleep patterns will be professionally monitored. Sleep apnea requires a checkup and treatment. Give your caregivers your medical history. Give your caregivers observations your family has made about your sleep.  SYMPTOMS   Not feeling rested in the morning.  Anxiety and restlessness at bedtime.  Difficulty falling and staying asleep. TREATMENT   Your caregiver may prescribe treatment for an underlying medical disorders. Your caregiver can give advice or help if you are using alcohol or other drugs for self-medication. Treatment of underlying problems will usually eliminate insomnia problems.  Medications can be prescribed for short time use. They are generally not recommended for lengthy use.  Over-the-counter sleep medicines are not recommended for lengthy use. They can be habit forming.  You can promote easier sleeping by making lifestyle changes such as:  Using relaxation techniques that help with breathing and reduce muscle tension.  Exercising earlier in the day.  Changing your diet and the time of your last meal. No night time snacks.  Establish a regular time to go to bed.  Counseling can help with stressful problems and worry.  Soothing music and white noise may be helpful if there are background noises you cannot remove.  Stop tedious detailed work at least one hour before bedtime. HOME CARE INSTRUCTIONS   Keep a diary. Inform your caregiver about your progress. This includes any medication side effects. See your caregiver regularly. Take  note of:  Times when you are  asleep.  Times when you are awake during the night.  The quality of your sleep.  How you feel the next day. This information will help your caregiver care for you.  Get out of bed if you are still awake after 15 minutes. Read or do some quiet activity. Keep the lights down. Wait until you feel sleepy and go back to bed.  Keep regular sleeping and waking hours. Avoid naps.  Exercise regularly.  Avoid distractions at bedtime. Distractions include watching television or engaging in any intense or detailed activity like attempting to balance the household checkbook.  Develop a bedtime ritual. Keep a familiar routine of bathing, brushing your teeth, climbing into bed at the same time each night, listening to soothing music. Routines increase the success of falling to sleep faster.  Use relaxation techniques. This can be using breathing and muscle tension release routines. It can also include visualizing peaceful scenes. You can also help control troubling or intruding thoughts by keeping your mind occupied with boring or repetitive thoughts like the old concept of counting sheep. You can make it more creative like imagining planting one beautiful flower after another in your backyard garden.  During your day, work to eliminate stress. When this is not possible use some of the previous suggestions to help reduce the anxiety that accompanies stressful situations. -     traZODone (DESYREL) 50 MG tablet; Take 1 tablet (50 mg total) by mouth as needed. Take 1 to 1 1/2 at night for insomnia  Class 2 obesity due to excess calories with body mass index (BMI) of 35.0 to 35.9 in adult, unspecified whether serious comorbidity present Obesity is 30-39 indicating an excess in caloric intake or underlining conditions. This may lead to other co-morbidities. Lifestyle modifications of diet and exercise may reduce obesity.  -     Lipid Panel  Screening for HIV (human immunodeficiency virus) -     HIV antibody  (with reflex)  Low testosterone History of will check levels -     Testosterone    Meds ordered this encounter  Medications  . busPIRone (BUSPAR) 5 MG tablet    Sig: Take 1 tablet (5 mg total) by mouth 2 (two) times daily.    Dispense:  60 tablet    Refill:  3  . traZODone (DESYREL) 50 MG tablet    Sig: Take 1 tablet (50 mg total) by mouth as needed. Take 1 to 1 1/2 at night for insomnia    Dispense:  45 tablet    Refill:  3  . amLODipine (NORVASC) 10 MG tablet    Sig: Take 1 tablet (10 mg total) by mouth daily.    Dispense:  90 tablet    Refill:  0  . Blood Pressure Monitor KIT    Sig: 1 Bag by Does not apply route 2 times daily at 12 noon and 4 pm.    Dispense:  1 kit    Refill:  0    Follow-up: Return in about 6 weeks (around 01/12/2019) for MEDICATION CHANGE for depression and anxiety .    Kerin Perna, NP

## 2018-12-01 NOTE — Progress Notes (Signed)
262.33mlbs

## 2018-12-02 ENCOUNTER — Encounter (INDEPENDENT_AMBULATORY_CARE_PROVIDER_SITE_OTHER): Payer: Self-pay | Admitting: Primary Care

## 2018-12-02 LAB — TESTOSTERONE: Testosterone: 271 ng/dL (ref 264–916)

## 2018-12-02 LAB — CBC WITH DIFFERENTIAL/PLATELET
Basophils Absolute: 0 10*3/uL (ref 0.0–0.2)
Basos: 1 %
EOS (ABSOLUTE): 0.1 10*3/uL (ref 0.0–0.4)
Eos: 1 %
Hematocrit: 42.8 % (ref 37.5–51.0)
Hemoglobin: 14.1 g/dL (ref 13.0–17.7)
Immature Grans (Abs): 0 10*3/uL (ref 0.0–0.1)
Immature Granulocytes: 0 %
Lymphocytes Absolute: 3.6 10*3/uL — ABNORMAL HIGH (ref 0.7–3.1)
Lymphs: 55 %
MCH: 27.9 pg (ref 26.6–33.0)
MCHC: 32.9 g/dL (ref 31.5–35.7)
MCV: 85 fL (ref 79–97)
Monocytes Absolute: 0.4 10*3/uL (ref 0.1–0.9)
Monocytes: 6 %
Neutrophils Absolute: 2.5 10*3/uL (ref 1.4–7.0)
Neutrophils: 37 %
Platelets: 254 10*3/uL (ref 150–450)
RBC: 5.05 x10E6/uL (ref 4.14–5.80)
RDW: 14 % (ref 11.6–15.4)
WBC: 6.6 10*3/uL (ref 3.4–10.8)

## 2018-12-02 LAB — CMP14+EGFR
ALT: 27 IU/L (ref 0–44)
AST: 25 IU/L (ref 0–40)
Albumin/Globulin Ratio: 1.9 (ref 1.2–2.2)
Albumin: 4.8 g/dL (ref 4.0–5.0)
Alkaline Phosphatase: 49 IU/L (ref 39–117)
BUN/Creatinine Ratio: 18 (ref 9–20)
BUN: 20 mg/dL (ref 6–20)
Bilirubin Total: 0.4 mg/dL (ref 0.0–1.2)
CO2: 22 mmol/L (ref 20–29)
Calcium: 9.7 mg/dL (ref 8.7–10.2)
Chloride: 106 mmol/L (ref 96–106)
Creatinine, Ser: 1.14 mg/dL (ref 0.76–1.27)
GFR calc Af Amer: 95 mL/min/{1.73_m2} (ref >59)
GFR calc non Af Amer: 82 mL/min/{1.73_m2} (ref >59)
Globulin, Total: 2.5 g/dL (ref 1.5–4.5)
Glucose: 93 mg/dL (ref 65–99)
Potassium: 4.2 mmol/L (ref 3.5–5.2)
Sodium: 142 mmol/L (ref 134–144)
Total Protein: 7.3 g/dL (ref 6.0–8.5)

## 2018-12-02 LAB — LIPID PANEL
Chol/HDL Ratio: 3.1 ratio (ref 0.0–5.0)
Cholesterol, Total: 192 mg/dL (ref 100–199)
HDL: 62 mg/dL (ref >39)
LDL Chol Calc (NIH): 119 mg/dL — ABNORMAL HIGH (ref 0–99)
Triglycerides: 57 mg/dL (ref 0–149)
VLDL Cholesterol Cal: 11 mg/dL (ref 5–40)

## 2018-12-02 LAB — HIV ANTIBODY (ROUTINE TESTING W REFLEX): HIV Screen 4th Generation wRfx: NONREACTIVE

## 2018-12-18 ENCOUNTER — Other Ambulatory Visit (INDEPENDENT_AMBULATORY_CARE_PROVIDER_SITE_OTHER): Payer: Self-pay | Admitting: Primary Care

## 2018-12-18 DIAGNOSIS — G4700 Insomnia, unspecified: Secondary | ICD-10-CM

## 2019-01-12 ENCOUNTER — Other Ambulatory Visit: Payer: Self-pay

## 2019-01-12 ENCOUNTER — Encounter (INDEPENDENT_AMBULATORY_CARE_PROVIDER_SITE_OTHER): Payer: Self-pay | Admitting: Primary Care

## 2019-01-12 ENCOUNTER — Ambulatory Visit (INDEPENDENT_AMBULATORY_CARE_PROVIDER_SITE_OTHER): Payer: Managed Care, Other (non HMO) | Admitting: Primary Care

## 2019-01-12 VITALS — BP 139/83 | HR 80 | Temp 97.3°F | Ht 69.0 in | Wt 271.2 lb

## 2019-01-12 DIAGNOSIS — I10 Essential (primary) hypertension: Secondary | ICD-10-CM

## 2019-01-12 DIAGNOSIS — F411 Generalized anxiety disorder: Secondary | ICD-10-CM

## 2019-01-12 DIAGNOSIS — G4700 Insomnia, unspecified: Secondary | ICD-10-CM

## 2019-01-12 MED ORDER — BUPROPION HCL ER (XL) 150 MG PO TB24: 150.0000 mg | ORAL_TABLET | Freq: Every day | ORAL | 3 refills | Status: DC

## 2019-01-12 MED ORDER — FLUOXETINE HCL 20 MG PO TABS: 20.0000 mg | ORAL_TABLET | Freq: Every day | ORAL | 3 refills | Status: DC

## 2019-01-12 NOTE — Progress Notes (Signed)
Acute Office Visit  Subjective:    Patient ID: Joel Wallace., male    DOB: 1980-06-09, 38 y.o.   MRN: [id number]  Chief Complaint  Patient presents with  . Follow-up    depression/anxiety     HPI Patient is in today for anxiety and insomnia . States the medication for sleep works but the anxiety medication has no affects. He is also concern with a bulging right upper quadrant.   Past Medical History:  Diagnosis Date  . ADHD (attention deficit hyperactivity disorder)    diagnosed 2012 by psychiatry, [employer]  . Allergy   . Anxiety   . Asthma   . DVT (deep venous thrombosis) ([place])   . Gastric ulcer   . GERD (gastroesophageal reflux disease)   . Hypertension   . Low testosterone   . Migraine   . Wears glasses     Past Surgical History:  Procedure Laterality Date  . COLONOSCOPY  2007  . ESOPHAGOGASTRODUODENOSCOPY  2007    Family History  Problem Relation Age of Onset  . Asthma Mother   . Ulcers Father   . Cancer Paternal Aunt   . Cancer Paternal Grandmother   . Clotting disorder Paternal Grandmother   . Clotting disorder Maternal Uncle   . Clotting disorder Cousin   . Heart disease Neg Hx   . Stroke Neg Hx     Social History   Socioeconomic History  . Marital status: Married    Spouse name: Not on file  . Number of children: Not on file  . Years of education: Not on file  . Highest education level: Not on file  Occupational History  . Not on file  Social Needs  . Financial resource strain: Not on file  . Food insecurity    Worry: Not on file    Inability: Not on file  . Transportation needs    Medical: Not on file    Non-medical: Not on file  Tobacco Use  . Smoking status: Never Smoker  . Smokeless tobacco: Never Used  Substance and Sexual Activity  . Alcohol use: Yes    Comment: 2 twice a month   . Drug use: No  . Sexual activity: Not on file  Lifestyle  . Physical activity    Days per week: Not on file    Minutes per  session: Not on file  . Stress: Not on file  Relationships  . Social Herbalist on phone: Not on file    Gets together: Not on file    Attends religious service: Not on file    Active member of club or organization: Not on file    Attends meetings of clubs or organizations: Not on file    Relationship status: Not on file  . Intimate partner violence    Fear of current or ex partner: Not on file    Emotionally abused: Not on file    Physically abused: Not on file    Forced sexual activity: Not on file  Other Topics Concern  . Not on file  Social History Narrative   Married, has 4 sons    Outpatient Medications Prior to Visit  Medication Sig Dispense Refill  . albuterol (PROVENTIL HFA;VENTOLIN HFA) 108 (90 Base) MCG/ACT inhaler Inhale 2 puffs into the lungs every 6 (six) hours as needed for wheezing or shortness of breath.    Marland Kitchen amLODipine (NORVASC) 10 MG tablet Take 1 tablet (10 mg total) by mouth daily.  90 tablet 0  . Multiple Vitamins-Minerals (MEGA MULTI MEN) TABS Take 1 tablet by mouth daily.    . traZODone (DESYREL) 50 MG tablet Take 1 tablet (50 mg total) by mouth as needed. Take 1 to 1 1/2 at night for insomnia 45 tablet 3  . busPIRone (BUSPAR) 5 MG tablet Take 1 tablet (5 mg total) by mouth 2 (two) times daily. 60 tablet 3  . Blood Pressure Monitor KIT 1 Bag by Does not apply route 2 times daily at 12 noon and 4 pm. (Patient not taking: Reported on 01/12/2019) 1 kit 0   No facility-administered medications prior to visit.     No Known Allergies  Review of Systems  Gastrointestinal: Positive for abdominal pain.  Psychiatric/Behavioral: The patient is nervous/anxious.   All other systems reviewed and are negative.      Objective:    Physical Exam  Constitutional: He is oriented to person, place, and time. He appears well-developed.  HENT:  Head: Normocephalic.  Eyes: Pupils are equal, round, and reactive to light. EOM are normal.  Neck: Normal range of  motion. Neck supple.  Cardiovascular: Normal rate and regular rhythm.  Pulmonary/Chest: Effort normal and breath sounds normal.  Abdominal: Soft. Bowel sounds are normal. He exhibits distension.  Musculoskeletal: Normal range of motion.  Neurological: He is oriented to person, place, and time.  Skin: Skin is warm and dry.  Psychiatric: He has a normal mood and affect.    BP 139/83 (BP Location: Right Arm, Patient Position: Sitting, Cuff Size: Large)   Pulse 80   Temp (!) 97.3 F (36.3 C) (Temporal)   Ht _0  (1.753 m)   Wt 271 lb 3.2 oz (123 kg)   SpO2 97%   BMI 40.05 kg/m  Wt Readings from Last 3 Encounters:  01/12/19 271 lb 3.2 oz (123 kg)  12/01/18 262 lb 9.6 oz (119.1 kg)  04/22/18 280 lb (127 kg)    There are no preventive care reminders to display for this patient.  There are no preventive care reminders to display for this patient.   Lab Results  Component Value Date   TSH 2.17 12/17/2015   Lab Results  Component Value Date   WBC 6.6 12/01/2018   HGB 14.1 12/01/2018   HCT 42.8 12/01/2018   MCV 85 12/01/2018   PLT 254 12/01/2018   Lab Results  Component Value Date   NA 142 12/01/2018   K 4.2 12/01/2018   CO2 22 12/01/2018   GLUCOSE 93 12/01/2018   BUN 20 12/01/2018   CREATININE 1.14 12/01/2018   BILITOT 0.4 12/01/2018   ALKPHOS 49 12/01/2018   AST 25 12/01/2018   ALT 27 12/01/2018   PROT 7.3 12/01/2018   ALBUMIN 4.8 12/01/2018   CALCIUM 9.7 12/01/2018   ANIONGAP 11 05/25/2017   Lab Results  Component Value Date   CHOL 192 12/01/2018   Lab Results  Component Value Date   HDL 62 12/01/2018   Lab Results  Component Value Date   LDLCALC 119 (H) 12/01/2018   Lab Results  Component Value Date   TRIG 57 12/01/2018   Lab Results  Component Value Date   CHOLHDL 3.1 12/01/2018   No results found for: HGBA1C     Assessment & Plan:    No orders of the defined types were placed in this encounter. Joel Wallace was seen today for  follow-up.  Diagnoses and all orders for this visit:  Generalized anxiety disorder Previously prescribed Buspar patient did not  feel it help his anxiety at all even with doubling dose. Discontinue prescribed Prozac and follow up with CSW. He feels his brain constantly wandering and can not shut it off.  -     FLUoxetine (PROZAC) 20 MG tablet; Take 1 tablet (20 mg total) by mouth daily.  Essential hypertension Bp has improved drastically with the addition of amlodipine 18m not at goal of 130/80 but today 139/83 previous visit 149/103. Continue  low-sodium diet, medication compliance, 150 minutes of moderate intensity exercise per week.   Insomnia, unspecified type Controlled on trazodone 75 mg nightly. (He takes 1 1/2 Trazodone (722m  Other orders -     Discontinue: buPROPion (WELLBUTRIN XL) 150 MG 24 hr tablet; Take 1 tablet (150 mg total) by mouth daily.     MiKerin PernaNP

## 2019-01-12 NOTE — Patient Instructions (Signed)

## 2019-02-07 ENCOUNTER — Encounter (INDEPENDENT_AMBULATORY_CARE_PROVIDER_SITE_OTHER): Payer: Self-pay | Admitting: Primary Care

## 2019-02-08 ENCOUNTER — Institutional Professional Consult (permissible substitution) (INDEPENDENT_AMBULATORY_CARE_PROVIDER_SITE_OTHER): Payer: Managed Care, Other (non HMO) | Admitting: Licensed Clinical Social Worker

## 2019-02-17 ENCOUNTER — Other Ambulatory Visit (INDEPENDENT_AMBULATORY_CARE_PROVIDER_SITE_OTHER): Payer: Self-pay | Admitting: Primary Care

## 2019-02-17 DIAGNOSIS — G4700 Insomnia, unspecified: Secondary | ICD-10-CM

## 2019-02-17 MED ORDER — TRAZODONE HCL 50 MG PO TABS: 50.0000 mg | ORAL_TABLET | ORAL | 3 refills | Status: DC | PRN

## 2019-02-17 MED ORDER — BUSPIRONE HCL 5 MG PO TABS: 5.0000 mg | ORAL_TABLET | Freq: Three times a day (TID) | ORAL | 3 refills | Status: DC

## 2019-02-26 ENCOUNTER — Other Ambulatory Visit (INDEPENDENT_AMBULATORY_CARE_PROVIDER_SITE_OTHER): Payer: Self-pay | Admitting: Primary Care

## 2019-02-26 DIAGNOSIS — I10 Essential (primary) hypertension: Secondary | ICD-10-CM

## 2019-03-09 IMAGING — CT CT ANGIO CHEST
2 of 6 series · 18 of 36 positions shown · IV contrast (Omni 300)
Comparison: Chest two-view 05/25/2017

CLINICAL DATA: Chest pain short of breath. Suspect pulmonary
embolism

EXAM:
CT ANGIOGRAPHY CHEST WITH CONTRAST
TECHNIQUE: Multidetector CT imaging of the chest was performed using the
standard protocol during bolus administration of intravenous
contrast. Multiplanar CT image reconstructions and MIPs were
obtained to evaluate the vascular anatomy.
CONTRAST:  100mL 3YEJ83-H6S IOPAMIDOL (3YEJ83-H6S) INJECTION 76%

[Series 7: pe thins · axial · 0.87mm/px · z∈[+1084,+1324]mm · 17 of 272 slices shown]
[im 16/272  lung]
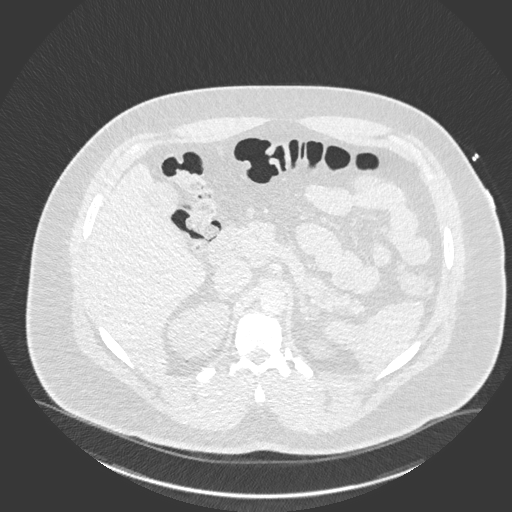
[im 31/272  mediastinal]
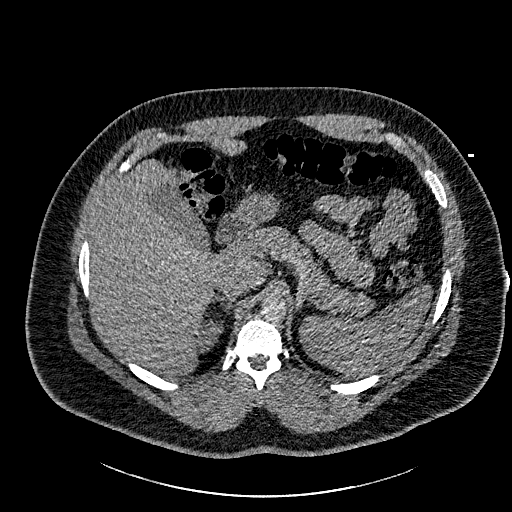
[im 46/272  lung]
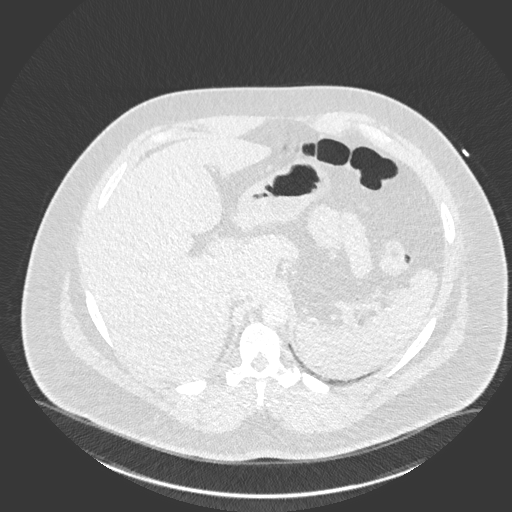
[im 61/272  mediastinal]
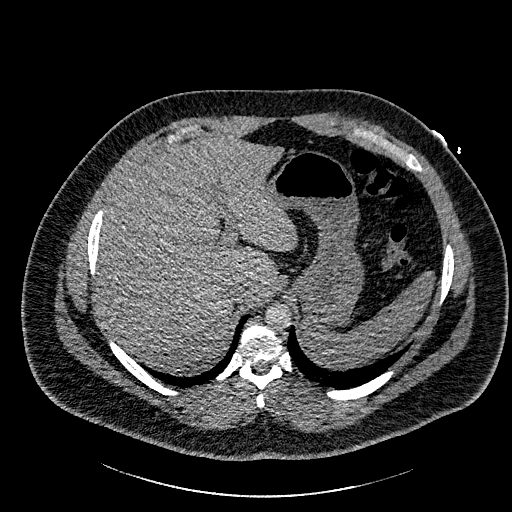
[im 76/272  lung]
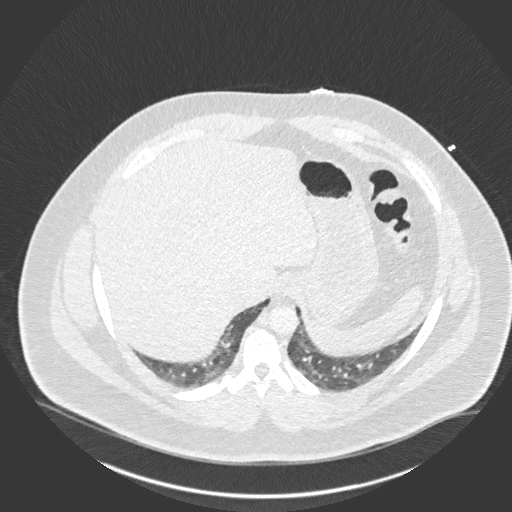
[im 91/272  mediastinal]
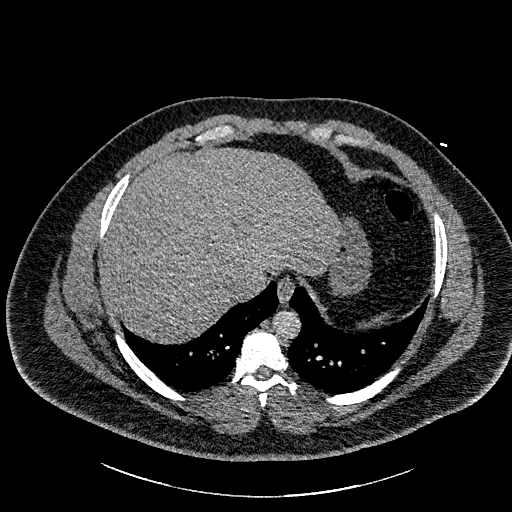
[im 106/272  lung]
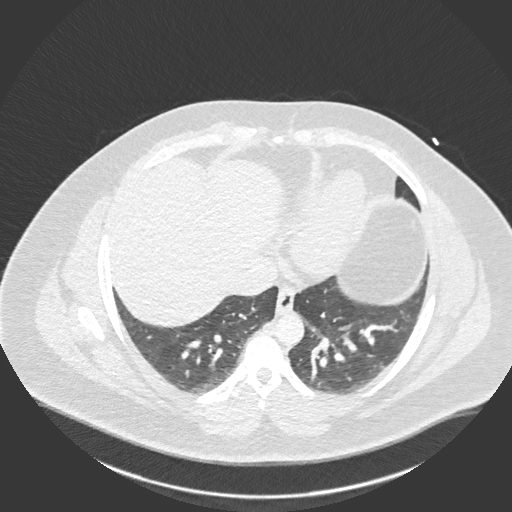
[im 121/272  mediastinal]
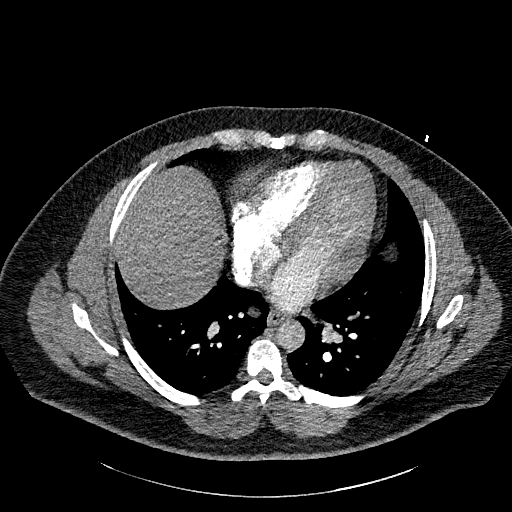
[im 136/272  lung]
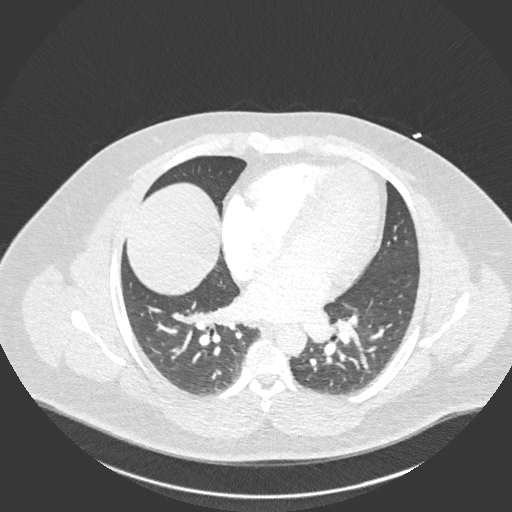
[im 151/272  mediastinal]
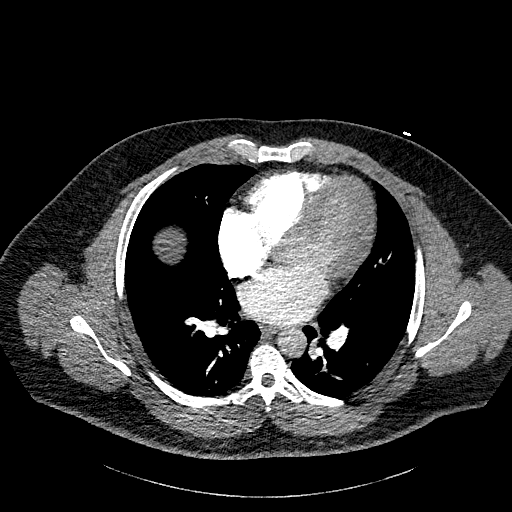
[im 166/272  lung]
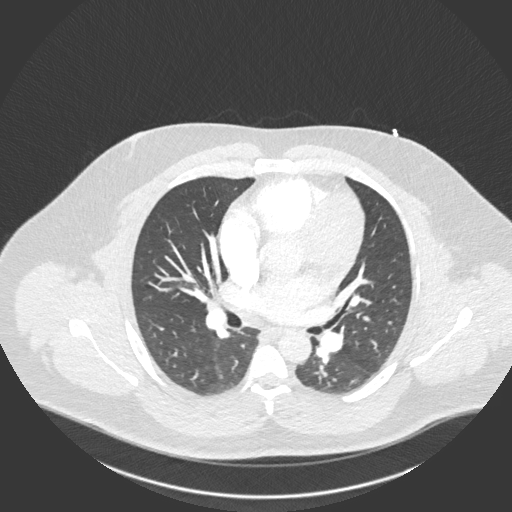
[im 181/272  mediastinal]
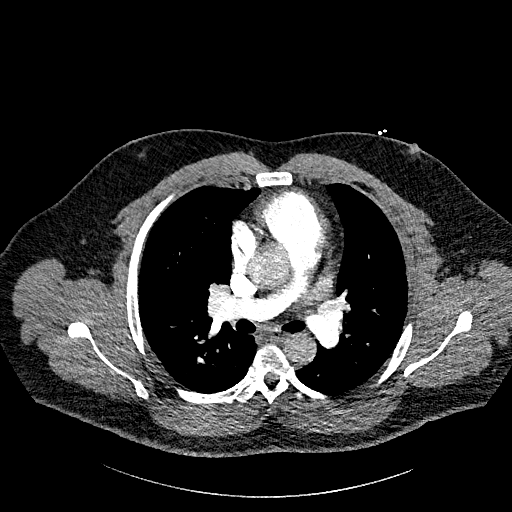
[im 196/272  lung]
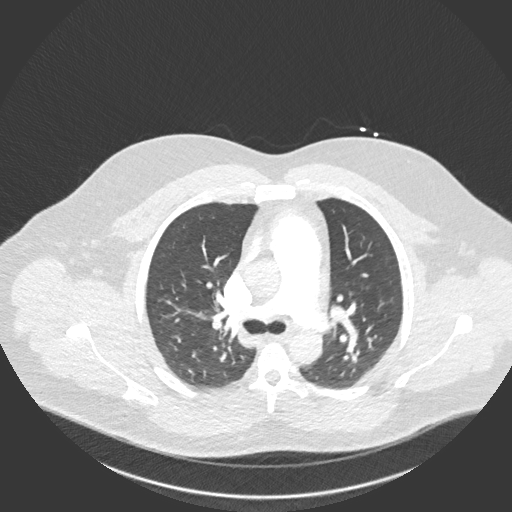
[im 211/272  mediastinal]
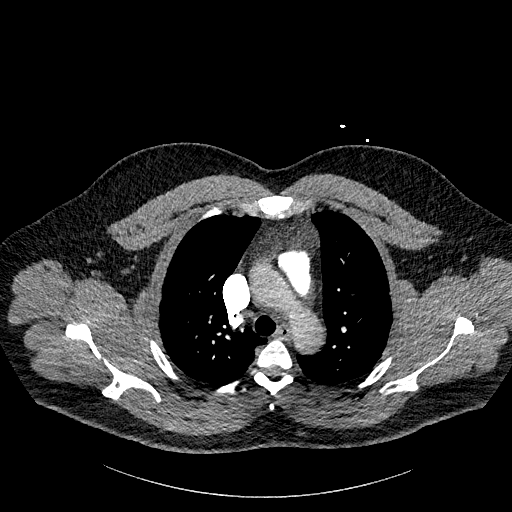
[im 226/272  lung]
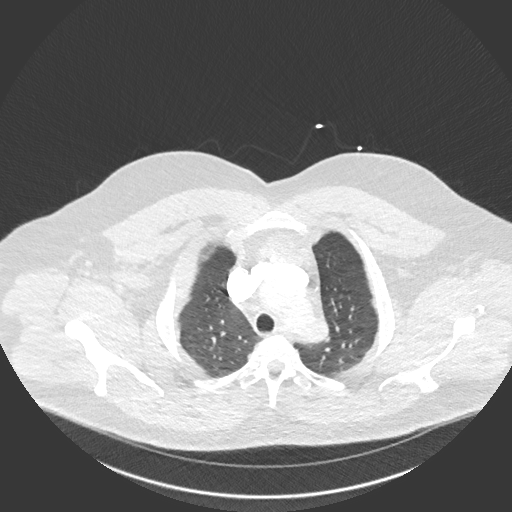
[im 241/272  mediastinal]
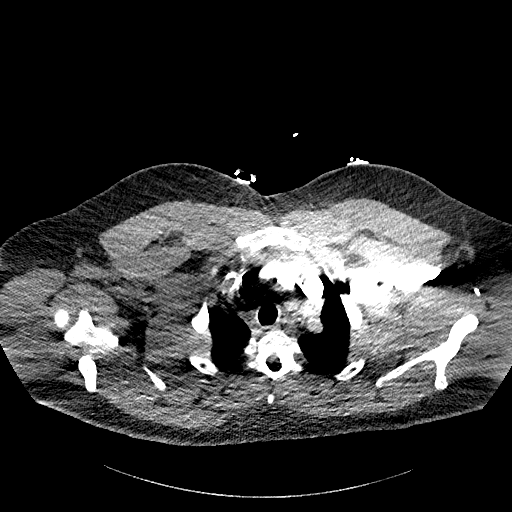
[im 256/272  lung]
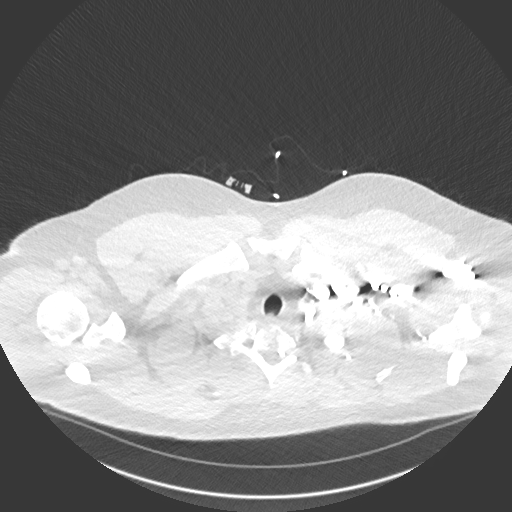

[Series 8: pe 2mm cor · coronal · 0.56mm/px · 1 of 155 slices shown]
[im 78/155  mediastinal]
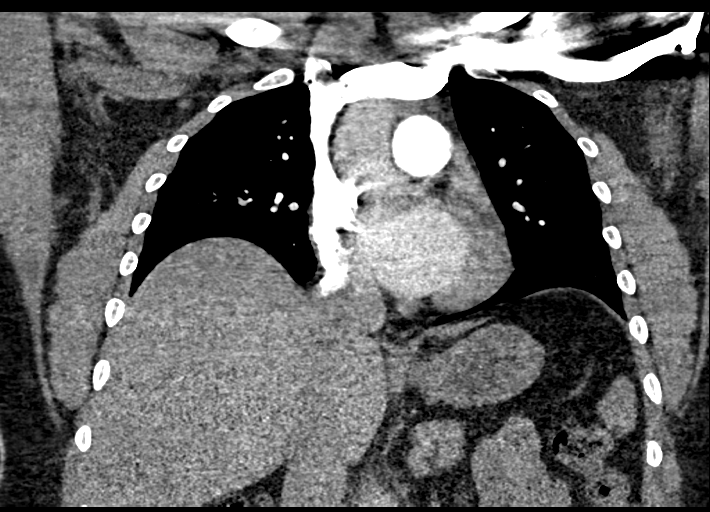

[18 of 36 positions shown; findings below may reference images not displayed]

FINDINGS: Cardiovascular: Negative for pulmonary embolism. Pulmonary arteries
normal in caliber. Heart size normal. No pericardial effusion.
Thoracic aorta normal in caliber. Minimal contrast in the aorta due
to timing of the scan

Mediastinum/Nodes: Negative for mass or adenopathy

Lungs/Pleura: Negative

Upper Abdomen: Negative

Musculoskeletal: Negative

Review of the MIP images confirms the above findings.
IMPRESSION: Negative for pulmonary embolism.  Negative CTA chest

## 2019-04-12 ENCOUNTER — Encounter (INDEPENDENT_AMBULATORY_CARE_PROVIDER_SITE_OTHER): Payer: Self-pay | Admitting: Primary Care

## 2019-05-27 ENCOUNTER — Ambulatory Visit (INDEPENDENT_AMBULATORY_CARE_PROVIDER_SITE_OTHER): Payer: Managed Care, Other (non HMO)

## 2019-05-27 ENCOUNTER — Ambulatory Visit (HOSPITAL_COMMUNITY)
Admission: EM | Admit: 2019-05-27 | Discharge: 2019-05-27 | Disposition: A | Discharge status: Home / Self Care | Payer: Managed Care, Other (non HMO)

## 2019-05-27 ENCOUNTER — Encounter (HOSPITAL_COMMUNITY): Payer: Self-pay

## 2019-05-27 ENCOUNTER — Other Ambulatory Visit: Payer: Self-pay

## 2019-05-27 DIAGNOSIS — R0602 Shortness of breath: Secondary | ICD-10-CM

## 2019-05-27 DIAGNOSIS — S20212A Contusion of left front wall of thorax, initial encounter: Secondary | ICD-10-CM | POA: Diagnosis not present

## 2019-05-27 DIAGNOSIS — R0789 Other chest pain: Secondary | ICD-10-CM

## 2019-05-27 MED ORDER — ACETAMINOPHEN 500 MG PO TABS: 1000.0000 mg | ORAL_TABLET | Freq: Three times a day (TID) | ORAL | 0 refills | Status: DC | PRN

## 2019-05-27 MED ORDER — IBUPROFEN 800 MG PO TABS: 800.0000 mg | ORAL_TABLET | Freq: Three times a day (TID) | ORAL | 0 refills | Status: DC

## 2019-05-27 NOTE — Discharge Instructions (Signed)
Take the 800 mg ibuprofen every 8 hours along with 1 g of Tylenol every 8 hours.  You may utilize ice and heat over the area of pain.  If you are not improving in 1 week please return for reevaluation.  If shortness of breath or pain becomes much worse please return for evaluation

## 2019-05-27 NOTE — ED Provider Notes (Signed)
[place]    CSN: [id number] Arrival date & time: 05/27/19  1646      History   Chief Complaint Chief Complaint  Patient presents with  . Flank Pain    HPI Joel Wallace. is a 39 y.o. male.   Patient reports falling on his left side 3 days ago.  Reports he was exiting his work truck tripped and fell to the ground.  Reports his fall was approximately 4 feet.  He reports falling onto his left arm and believes his left elbow jammed into his ribs.  He has had left rib pain that has been severe enough to alter his breathing.  He reports shortness of breath.  He reports pain with deep inspiration.  He denies hitting his head.  He does report some shoulder and elbow pain.  However his primary concern is his ribs.  Patient does have a history of asthma     Past Medical History:  Diagnosis Date  . ADHD (attention deficit hyperactivity disorder)    diagnosed 2012 by psychiatry, [employer]  . Allergy   . Anxiety   . Asthma   . DVT (deep venous thrombosis) ([place])   . Gastric ulcer   . GERD (gastroesophageal reflux disease)   . Hypertension   . Low testosterone   . Migraine   . Wears glasses     Patient Active Problem List   Diagnosis Date Noted  . Hypogonadism in male 02/27/2016  . Impaired fasting blood sugar 12/17/2015  . Low testosterone in male 12/17/2015  . Obesity with serious comorbidity 12/17/2015  . Essential hypertension 12/17/2015  . High risk medication use 12/17/2015  . History of DVT (deep vein thrombosis) 12/17/2015    Past Surgical History:  Procedure Laterality Date  . COLONOSCOPY  2007  . ESOPHAGOGASTRODUODENOSCOPY  2007       Home Medications    Prior to Admission medications   Medication Sig Start Date End Date Taking? Authorizing Provider  acetaminophen (TYLENOL) 500 MG tablet Take 2 tablets (1,000 mg total) by mouth every 8 (eight) hours as needed for moderate pain. 05/27/19   Elanna Bert, Marguerita Beards, PA-C  albuterol  (PROVENTIL HFA;VENTOLIN HFA) 108 (90 Base) MCG/ACT inhaler Inhale 2 puffs into the lungs every 6 (six) hours as needed for wheezing or shortness of breath.    [provider]  amLODipine (NORVASC) 10 MG tablet TAKE 1 TABLET BY MOUTH ONCE DAILY 02/28/19   Charlott Rakes, MD  Blood Pressure Monitor KIT 1 Bag by Does not apply route 2 times daily at 12 noon and 4 pm. Patient not taking: Reported on 01/12/2019 12/01/18   Kerin Perna, NP  buPROPion (WELLBUTRIN XL) 150 MG 24 hr tablet  01/12/19   [provider]  busPIRone (BUSPAR) 5 MG tablet Take 1 tablet (5 mg total) by mouth 3 (three) times daily. 02/17/19   Kerin Perna, NP  ibuprofen (ADVIL) 800 MG tablet Take 1 tablet (800 mg total) by mouth 3 (three) times daily. 05/27/19   Salima Rumer, Marguerita Beards, PA-C  Multiple Vitamins-Minerals (MEGA MULTI MEN) TABS Take 1 tablet by mouth daily.    [provider]  traZODone (DESYREL) 50 MG tablet Take 1 tablet (50 mg total) by mouth as needed. Take 1 to 1 1/2 at night for insomnia 02/17/19   Kerin Perna, NP    Family History Family History  Problem Relation Age of Onset  . Asthma Mother   . Ulcers Father   .  Cancer Paternal Aunt   . Cancer Paternal Grandmother   . Clotting disorder Paternal Grandmother   . Clotting disorder Maternal Uncle   . Clotting disorder Cousin   . Heart disease Neg Hx   . Stroke Neg Hx     Social History Social History   Tobacco Use  . Smoking status: Never Smoker  . Smokeless tobacco: Never Used  Substance Use Topics  . Alcohol use: Yes    Comment: 2 twice a month   . Drug use: No     Allergies   Patient has no known allergies.   Review of Systems Review of Systems  Respiratory: Positive for shortness of breath. Negative for cough and chest tightness.   Cardiovascular: Positive for chest pain (rib pain). Negative for palpitations.  Gastrointestinal: Negative for abdominal pain.  Skin: Negative for color change and rash.   Neurological: Negative for headaches.     Physical Exam Triage Vital Signs ED Triage Vitals  Enc Vitals Group     BP 05/27/19 1733 (!) 145/89     Pulse Rate 05/27/19 1733 70     Resp 05/27/19 1733 19     Temp 05/27/19 1733 98.7 F (37.1 C)     Temp Source 05/27/19 1733 Oral     SpO2 05/27/19 1733 97 %     Weight 05/27/19 1731 293 lb 6.4 oz (133.1 kg)     Height --      Head Circumference --      Peak Flow --      Pain Score 05/27/19 1731 8     Pain Loc --      Pain Edu? --      Excl. in [place]? --    No data found.  Updated Vital Signs BP (!) 145/89 (BP Location: Left Arm)   Pulse 70   Temp 98.7 F (37.1 C) (Oral)   Resp 19   Wt 293 lb 6.4 oz (133.1 kg)   SpO2 97%   BMI 43.33 kg/m   Visual Acuity Right Eye Distance:   Left Eye Distance:   Bilateral Distance:    Right Eye Near:   Left Eye Near:    Bilateral Near:     Physical Exam Vitals and nursing note reviewed.  Constitutional:      General: He is not in acute distress.    Appearance: Normal appearance. He is well-developed. He is not ill-appearing.  HENT:     Head: Normocephalic and atraumatic.  Eyes:     Conjunctiva/sclera: Conjunctivae normal.  Cardiovascular:     Rate and Rhythm: Normal rate and regular rhythm.     Heart sounds: No murmur.  Pulmonary:     Effort: Pulmonary effort is normal. No respiratory distress.     Breath sounds: Normal breath sounds. No wheezing.  Chest:     Chest wall: Tenderness (Tenderness over ribs 7 through 9 the midclavicular line on the left side) present.  Abdominal:     Palpations: Abdomen is soft.     Tenderness: There is no abdominal tenderness.  Musculoskeletal:        General: Normal range of motion.     Cervical back: Neck supple.  Skin:    General: Skin is warm and dry.  Neurological:     General: No focal deficit present.     Mental Status: He is alert and oriented to person, place, and time.      UC Treatments / Results  Labs (all labs ordered  are  listed, but only abnormal results are displayed) Labs Reviewed - No data to display  EKG   Radiology DG Chest 2 View  Result Date: 05/27/2019 CLINICAL DATA:  Left rib pain with shortness of breath. Fall 2 days ago landing on left side. EXAM: CHEST - 2 VIEW COMPARISON:  None. FINDINGS: The cardiomediastinal contours are normal. The lungs are clear. Pulmonary vasculature is normal. No consolidation, pleural effusion, or pneumothorax. No evidence of acute rib fracture on these non dedicated rib views. IMPRESSION: Negative radiographs of the chest. No evidence of rib fracture on these non dedicated rib views. Electronically Signed   By: Keith Rake M.D.   On: 05/27/2019 18:13    Procedures Procedures (including critical care time)  Medications Ordered in UC Medications - No data to display  Initial Impression / Assessment and Plan / UC Course  I have reviewed the triage vital signs and the nursing notes.  Pertinent labs & imaging results that were available during my care of the patient were reviewed by me and considered in my medical decision making (see chart for details).     #Rib contusion Patient 39 year old male presenting after fall with left-sided rib pain.  X-ray negative for lung injury and rib fracture.  Though imaging was not solely focused on rib views location of pain would be in view of chest x-ray and felt this would be adequate.  I believe that pain has been causing his symptoms of shortness of breath as he is saturating at 97% with an adequate respiratory rate.  We will treat his pain with ibuprofen and Tylenol scheduled every 8 hours.  Follow-up instructions discussed and patient verbalizes understanding.  To avoid contact sports or activities. Final Clinical Impressions(s) / UC Diagnoses   Final diagnoses:  Contusion of rib on left side, initial encounter     Discharge Instructions     Take the 800 mg ibuprofen every 8 hours along with 1 g of Tylenol every  8 hours.  You may utilize ice and heat over the area of pain.  If you are not improving in 1 week please return for reevaluation.  If shortness of breath or pain becomes much worse please return for evaluation    ED Prescriptions    Medication Sig Dispense Auth. Provider   ibuprofen (ADVIL) 800 MG tablet Take 1 tablet (800 mg total) by mouth 3 (three) times daily. 21 tablet Cashius Grandstaff, Marguerita Beards, PA-C   acetaminophen (TYLENOL) 500 MG tablet Take 2 tablets (1,000 mg total) by mouth every 8 (eight) hours as needed for moderate pain. 30 tablet Milam Allbaugh, Marguerita Beards, PA-C     PDMP not reviewed this encounter.   Purnell Shoemaker, PA-C 05/27/19 2043

## 2019-05-27 NOTE — ED Triage Notes (Signed)
Pt is here after a fall that happened Wednesday ever since he has had SOB, left side flank pain. Pt has used [employer] Tylenol.

## 2019-06-03 ENCOUNTER — Other Ambulatory Visit (INDEPENDENT_AMBULATORY_CARE_PROVIDER_SITE_OTHER): Payer: Self-pay | Admitting: Family Medicine

## 2019-06-03 DIAGNOSIS — I10 Essential (primary) hypertension: Secondary | ICD-10-CM

## 2019-09-05 ENCOUNTER — Encounter (INDEPENDENT_AMBULATORY_CARE_PROVIDER_SITE_OTHER): Payer: Self-pay | Admitting: Primary Care

## 2019-10-03 ENCOUNTER — Encounter (INDEPENDENT_AMBULATORY_CARE_PROVIDER_SITE_OTHER): Payer: Self-pay | Admitting: Primary Care

## 2019-10-03 ENCOUNTER — Other Ambulatory Visit: Payer: Self-pay

## 2019-10-03 ENCOUNTER — Ambulatory Visit (INDEPENDENT_AMBULATORY_CARE_PROVIDER_SITE_OTHER): Payer: Managed Care, Other (non HMO) | Admitting: Primary Care

## 2019-10-03 VITALS — BP 158/111 | HR 77 | Temp 98.4°F | Ht 69.0 in | Wt 309.0 lb

## 2019-10-03 DIAGNOSIS — I10 Essential (primary) hypertension: Secondary | ICD-10-CM | POA: Diagnosis not present

## 2019-10-03 DIAGNOSIS — R1033 Periumbilical pain: Secondary | ICD-10-CM | POA: Diagnosis not present

## 2019-10-03 DIAGNOSIS — K921 Melena: Secondary | ICD-10-CM

## 2019-10-03 NOTE — Progress Notes (Signed)
Established Patient Office Visit  Subjective:  Patient ID: Joel Wallace., male    DOB: 1980/10/21  Age: 39 y.o. MRN: [id number]  CC:  Chief Complaint  Patient presents with  . Weight Check    Pt. is here to discuss w/ PCP on weight managment.     HPI [name] presents for discussion for medication to help loose weight. NO patient has missed Bp medication 2 days and in today with Bp 158/111.  Past Medical History:  Diagnosis Date  . ADHD (attention deficit hyperactivity disorder)    diagnosed 2012 by psychiatry, [employer]  . Allergy   . Anxiety   . Asthma   . DVT (deep venous thrombosis) ([place])   . Gastric ulcer   . GERD (gastroesophageal reflux disease)   . Hypertension   . Low testosterone   . Migraine   . Wears glasses     Past Surgical History:  Procedure Laterality Date  . COLONOSCOPY  2007  . ESOPHAGOGASTRODUODENOSCOPY  2007    Family History  Problem Relation Age of Onset  . Asthma Mother   . Ulcers Father   . Cancer Paternal Aunt   . Cancer Paternal Grandmother   . Clotting disorder Paternal Grandmother   . Clotting disorder Maternal Uncle   . Clotting disorder Cousin   . Heart disease Neg Hx   . Stroke Neg Hx     Social History   Socioeconomic History  . Marital status: Married    Spouse name: Not on file  . Number of children: Not on file  . Years of education: Not on file  . Highest education level: Not on file  Occupational History  . Not on file  Tobacco Use  . Smoking status: Never Smoker  . Smokeless tobacco: Never Used  Vaping Use  . Vaping Use: Never used  Substance and Sexual Activity  . Alcohol use: Yes    Comment: 2 twice a month   . Drug use: No  . Sexual activity: Yes    Birth control/protection: None    Comment: Married 15 years  Other Topics Concern  . Not on file  Social History Narrative   Married, has 4 sons   Social Determinants of Radio broadcast assistant Strain:   .  Difficulty of Paying Living Expenses:   Food Insecurity:   . Worried About Charity fundraiser in the Last Year:   . Arboriculturist in the Last Year:   Transportation Needs:   . Film/video editor (Medical):   Marland Kitchen Lack of Transportation (Non-Medical):   Physical Activity:   . Days of Exercise per Week:   . Minutes of Exercise per Session:   Stress:   . Feeling of Stress :   Social Connections:   . Frequency of Communication with Friends and Family:   . Frequency of Social Gatherings with Friends and Family:   . Attends Religious Services:   . Active Member of Clubs or Organizations:   . Attends Archivist Meetings:   Marland Kitchen Marital Status:   Intimate Partner Violence:   . Fear of Current or Ex-Partner:   . Emotionally Abused:   Marland Kitchen Physically Abused:   . Sexually Abused:     Outpatient Medications Prior to Visit  Medication Sig Dispense Refill  . acetaminophen (TYLENOL) 500 MG tablet Take 2 tablets (1,000 mg total) by mouth every 8 (eight) hours as needed for moderate pain. 30 tablet 0  .  albuterol (PROVENTIL HFA;VENTOLIN HFA) 108 (90 Base) MCG/ACT inhaler Inhale 2 puffs into the lungs every 6 (six) hours as needed for wheezing or shortness of breath.    . busPIRone (BUSPAR) 5 MG tablet Take 1 tablet (5 mg total) by mouth 3 (three) times daily. 90 tablet 3  . ibuprofen (ADVIL) 800 MG tablet Take 1 tablet (800 mg total) by mouth 3 (three) times daily. 21 tablet 0  . Multiple Vitamins-Minerals (MEGA MULTI MEN) TABS Take 1 tablet by mouth daily.    . traZODone (DESYREL) 50 MG tablet Take 1 tablet (50 mg total) by mouth as needed. Take 1 to 1 1/2 at night for insomnia 45 tablet 3  . amLODipine (NORVASC) 10 MG tablet Take 1 tablet (10 mg total) by mouth daily. Must have office visit for refills 90 tablet 0  . Blood Pressure Monitor KIT 1 Bag by Does not apply route 2 times daily at 12 noon and 4 pm. (Patient not taking: Reported on 01/12/2019) 1 kit 0  . buPROPion (WELLBUTRIN XL)  150 MG 24 hr tablet      No facility-administered medications prior to visit.    No Known Allergies  ROS Review of Systems  Gastrointestinal: Positive for abdominal distention, abdominal pain and blood in stool.  All other systems reviewed and are negative.     Objective:    Physical Exam Vitals reviewed.  Constitutional:      Appearance: Normal appearance.  HENT:     Right Ear: Tympanic membrane normal.     Left Ear: Tympanic membrane normal.     Nose: Nose normal.  Cardiovascular:     Rate and Rhythm: Normal rate and regular rhythm.     Pulses: Normal pulses.     Heart sounds: Normal heart sounds.  Pulmonary:     Effort: Pulmonary effort is normal.     Breath sounds: Normal breath sounds.  Abdominal:     General: Bowel sounds are normal.  Musculoskeletal:        General: Normal range of motion.     Cervical back: Normal range of motion and neck supple.  Skin:    General: Skin is warm and dry.  Neurological:     Mental Status: He is oriented to person, place, and time.  Psychiatric:        Mood and Affect: Mood normal.        Behavior: Behavior normal.     BP (!) 158/111 (BP Location: Left Arm, Patient Position: Sitting, Cuff Size: Large)   Pulse 77   Temp 98.4 F (36.9 C) (Oral)   Ht _0  (1.753 m)   Wt (!) 309 lb (140.2 kg)   SpO2 98%   BMI 45.63 kg/m  Wt Readings from Last 3 Encounters:  10/03/19 (!) 309 lb (140.2 kg)  05/27/19 293 lb 6.4 oz (133.1 kg)  01/12/19 271 lb 3.2 oz (123 kg)     Health Maintenance Due  Topic Date Due  . Hepatitis C Screening  Never done  . COVID-19 Vaccine (1) Never done    There are no preventive care reminders to display for this patient.  Lab Results  Component Value Date   TSH 2.17 12/17/2015   Lab Results  Component Value Date   WBC 6.6 12/01/2018   HGB 14.1 12/01/2018   HCT 42.8 12/01/2018   MCV 85 12/01/2018   PLT 254 12/01/2018   Lab Results  Component Value Date   NA 142 12/01/2018   K 4.2  12/01/2018   CO2 22 12/01/2018   GLUCOSE 93 12/01/2018   BUN 20 12/01/2018   CREATININE 1.14 12/01/2018   BILITOT 0.4 12/01/2018   ALKPHOS 49 12/01/2018   AST 25 12/01/2018   ALT 27 12/01/2018   PROT 7.3 12/01/2018   ALBUMIN 4.8 12/01/2018   CALCIUM 9.7 12/01/2018   ANIONGAP 11 05/25/2017   Lab Results  Component Value Date   CHOL 192 12/01/2018   Lab Results  Component Value Date   HDL 62 12/01/2018   Lab Results  Component Value Date   LDLCALC 119 (H) 12/01/2018   Lab Results  Component Value Date   TRIG 57 12/01/2018   Lab Results  Component Value Date   CHOLHDL 3.1 12/01/2018   No results found for: HGBA1C    Assessment & Plan:  Joel Wallace was seen today for weight check.  Diagnoses and all orders for this visit:  Essential hypertension Patient out of medication last seen January 12, 2019 Counseled on blood pressure goal of less than 130/80, low-sodium, DASH diet, , 150 minutes of moderate intensity exercise per week. Refill amlodipine 10 mg added HCTZ 25 mg take both daily  Blood in stool -     Ambulatory referral to Gastroenterology  Periumbilical abdominal pain -     Ambulatory referral to Gastroenterology   Meds ordered this encounter  Medications  . amLODipine (NORVASC) 10 MG tablet    Sig: Take 1 tablet (10 mg total) by mouth daily.    Dispense:  90 tablet    Refill:  0    Must have office visit for refills  . hydrochlorothiazide (HYDRODIURIL) 25 MG tablet    Sig: Take 1 tablet (25 mg total) by mouth daily.    Dispense:  90 tablet    Refill:  3    Follow-up: Return in about 4 weeks (around 10/31/2019) for in person Bp check.    Kerin Perna, NP

## 2019-10-03 NOTE — Patient Instructions (Signed)
   Managing Your Hypertension Hypertension is commonly called high blood pressure. This is when the force of your blood pressing against the walls of your arteries is too strong. Arteries are blood vessels that carry blood from your heart throughout your body. Hypertension forces the heart to work harder to pump blood, and may cause the arteries to become narrow or stiff. Having untreated or uncontrolled hypertension can cause heart attack, stroke, kidney disease, and other problems. What are blood pressure readings? A blood pressure reading consists of a higher number over a lower number. Ideally, your blood pressure should be below 120/80. The first ("top") number is called the systolic pressure. It is a measure of the pressure in your arteries as your heart beats. The second ("bottom") number is called the diastolic pressure. It is a measure of the pressure in your arteries as the heart relaxes. What does my blood pressure reading mean? Blood pressure is classified into four stages. Based on your blood pressure reading, your health care provider may use the following stages to determine what type of treatment you need, if any. Systolic pressure and diastolic pressure are measured in a unit called mm Hg. Normal  Systolic pressure: below 120.  Diastolic pressure: below 80. Elevated  Systolic pressure: 120-129.  Diastolic pressure: below 80. Hypertension stage 1  Systolic pressure: 130-139.  Diastolic pressure: 80-89. Hypertension stage 2  Systolic pressure: 140 or above.  Diastolic pressure: 90 or above. What health risks are associated with hypertension? Managing your hypertension is an important responsibility. Uncontrolled hypertension can lead to:  A heart attack.  A stroke.  A weakened blood vessel (aneurysm).  Heart failure.  Kidney damage.  Eye damage.  Metabolic syndrome.  Memory and concentration problems. What changes can I make to manage my  hypertension? Hypertension can be managed by making lifestyle changes and possibly by taking medicines. Your health care provider will help you make a plan to bring your blood pressure within a normal range. Eating and drinking   Eat a diet that is high in fiber and potassium, and low in salt (sodium), added sugar, and fat. An example eating plan is called the DASH (Dietary Approaches to Stop Hypertension) diet. To eat this way: ? Eat plenty of fresh fruits and vegetables. Try to fill half of your plate at each meal with fruits and vegetables. ? Eat whole grains, such as whole wheat pasta, brown rice, or whole grain bread. Fill about one quarter of your plate with whole grains. ? Eat low-fat diary products. ? Avoid fatty cuts of meat, processed or cured meats, and poultry with skin. Fill about one quarter of your plate with lean proteins such as fish, chicken without skin, beans, eggs, and tofu. ? Avoid premade and processed foods. These tend to be higher in sodium, added sugar, and fat.  Reduce your daily sodium intake. Most people with hypertension should eat less than 1,500 mg of sodium a day.  Limit alcohol intake to no more than 1 drink a day for nonpregnant women and 2 drinks a day for men. One drink equals 12 oz of beer, 5 oz of wine, or 1 oz of hard liquor. Lifestyle  Work with your health care provider to maintain a healthy body weight, or to lose weight. Ask what an ideal weight is for you.  Get at least 30 minutes of exercise that causes your heart to beat faster (aerobic exercise) most days of the week. Activities may include walking, swimming, or biking.    Include exercise to strengthen your muscles (resistance exercise), such as weight lifting, as part of your weekly exercise routine. Try to do these types of exercises for 30 minutes at least 3 days a week.  Do not use any products that contain nicotine or tobacco, such as cigarettes and e-cigarettes. If you need help quitting,  ask your health care provider.  Control any long-term (chronic) conditions you have, such as high cholesterol or diabetes. Monitoring  Monitor your blood pressure at home as told by your health care provider. Your personal target blood pressure may vary depending on your medical conditions, your age, and other factors.  Have your blood pressure checked regularly, as often as told by your health care provider. Working with your health care provider  Review all the medicines you take with your health care provider because there may be side effects or interactions.  Talk with your health care provider about your diet, exercise habits, and other lifestyle factors that may be contributing to hypertension.  Visit your health care provider regularly. Your health care provider can help you create and adjust your plan for managing hypertension. Will I need medicine to control my blood pressure? Your health care provider may prescribe medicine if lifestyle changes are not enough to get your blood pressure under control, and if:  Your systolic blood pressure is 130 or higher.  Your diastolic blood pressure is 80 or higher. Take medicines only as told by your health care provider. Follow the directions carefully. Blood pressure medicines must be taken as prescribed. The medicine does not work as well when you skip doses. Skipping doses also puts you at risk for problems. Contact a health care provider if:  You think you are having a reaction to medicines you have taken.  You have repeated (recurrent) headaches.  You feel dizzy.  You have swelling in your ankles.  You have trouble with your vision. Get help right away if:  You develop a severe headache or confusion.  You have unusual weakness or numbness, or you feel faint.  You have severe pain in your chest or abdomen.  You vomit repeatedly.  You have trouble breathing. Summary  Hypertension is when the force of blood pumping  through your arteries is too strong. If this condition is not controlled, it may put you at risk for serious complications.  Your personal target blood pressure may vary depending on your medical conditions, your age, and other factors. For most people, a normal blood pressure is less than 120/80.  Hypertension is managed by lifestyle changes, medicines, or both. Lifestyle changes include weight loss, eating a healthy, low-sodium diet, exercising more, and limiting alcohol. This information is not intended to replace advice given to you by your health care provider. Make sure you discuss any questions you have with your health care provider. Document Revised: 06/25/2018 Document Reviewed: 01/30/2016 Elsevier Patient Education  2020 [employer] Inc[employer]  

## 2019-10-04 ENCOUNTER — Other Ambulatory Visit (HOSPITAL_COMMUNITY): Payer: Self-pay | Admitting: Gastroenterology

## 2019-10-04 ENCOUNTER — Other Ambulatory Visit: Payer: Self-pay | Admitting: Gastroenterology

## 2019-10-04 DIAGNOSIS — R1011 Right upper quadrant pain: Secondary | ICD-10-CM

## 2019-10-08 ENCOUNTER — Encounter (INDEPENDENT_AMBULATORY_CARE_PROVIDER_SITE_OTHER): Payer: Self-pay | Admitting: Primary Care

## 2019-10-08 MED ORDER — AMLODIPINE BESYLATE 10 MG PO TABS: 10.0000 mg | ORAL_TABLET | Freq: Every day | ORAL | 0 refills | Status: DC

## 2019-10-08 MED ORDER — HYDROCHLOROTHIAZIDE 25 MG PO TABS: 25.0000 mg | ORAL_TABLET | Freq: Every day | ORAL | 3 refills | Status: DC

## 2019-10-10 ENCOUNTER — Other Ambulatory Visit (INDEPENDENT_AMBULATORY_CARE_PROVIDER_SITE_OTHER): Payer: Self-pay | Admitting: Primary Care

## 2019-10-10 DIAGNOSIS — G4700 Insomnia, unspecified: Secondary | ICD-10-CM

## 2019-10-13 ENCOUNTER — Other Ambulatory Visit (INDEPENDENT_AMBULATORY_CARE_PROVIDER_SITE_OTHER): Payer: Self-pay | Admitting: Primary Care

## 2019-10-17 ENCOUNTER — Ambulatory Visit (INDEPENDENT_AMBULATORY_CARE_PROVIDER_SITE_OTHER): Payer: Managed Care, Other (non HMO) | Admitting: Primary Care

## 2019-10-20 ENCOUNTER — Ambulatory Visit (HOSPITAL_BASED_OUTPATIENT_CLINIC_OR_DEPARTMENT_OTHER)
Admission: RE | Admit: 2019-10-20 | Discharge: 2019-10-20 | Disposition: A | Discharge status: Home / Self Care | Payer: Managed Care, Other (non HMO) | Source: Ambulatory Visit | Attending: Gastroenterology | Admitting: Gastroenterology

## 2019-10-20 ENCOUNTER — Ambulatory Visit (HOSPITAL_COMMUNITY)
Admission: RE | Admit: 2019-10-20 | Discharge: 2019-10-20 | Disposition: A | Discharge status: Home / Self Care | Payer: Managed Care, Other (non HMO) | Source: Ambulatory Visit | Attending: Gastroenterology | Admitting: Gastroenterology

## 2019-10-20 ENCOUNTER — Other Ambulatory Visit: Payer: Self-pay

## 2019-10-20 DIAGNOSIS — R1011 Right upper quadrant pain: Secondary | ICD-10-CM | POA: Insufficient documentation

## 2019-10-20 MED ORDER — TECHNETIUM TC 99M MEBROFENIN IV KIT
5.4000 | PACK | Freq: Once | INTRAVENOUS | Status: AC | PRN
  Administered 2019-10-20: 5.4 via INTRAVENOUS

## 2019-11-02 ENCOUNTER — Ambulatory Visit (INDEPENDENT_AMBULATORY_CARE_PROVIDER_SITE_OTHER): Payer: Managed Care, Other (non HMO) | Admitting: Primary Care

## 2019-11-02 ENCOUNTER — Other Ambulatory Visit: Payer: Self-pay

## 2019-11-02 VITALS — BP 141/97 | HR 97 | Temp 98.2°F | Ht 69.0 in | Wt 308.0 lb

## 2019-11-02 DIAGNOSIS — N529 Male erectile dysfunction, unspecified: Secondary | ICD-10-CM | POA: Diagnosis not present

## 2019-11-02 DIAGNOSIS — I10 Essential (primary) hypertension: Secondary | ICD-10-CM | POA: Diagnosis not present

## 2019-11-02 MED ORDER — LOSARTAN POTASSIUM 100 MG PO TABS: 100.0000 mg | ORAL_TABLET | Freq: Every day | ORAL | 3 refills | Status: DC

## 2019-11-02 NOTE — Progress Notes (Signed)
[place]  Mr. Carden Teel is a 39 year old morbid obese male who presents for  hypertension evaluation, on previous visit medication was adjusted to include amlodipine 72m and HCTZ 25 mg daily. Blood pressure remains elevated .  Patient is not quite sure if he snores.  His homework is asked his wife and on follow-up reevaluate blood pressure and rule in or out OSA. He voiced concerns with ED since starting medication.   Patient reports adherence with medications.  Current Medication List Current Outpatient Medications on File Prior to Visit  Medication Sig Dispense Refill  . acetaminophen (TYLENOL) 500 MG tablet Take 2 tablets (1,000 mg total) by mouth every 8 (eight) hours as needed for moderate pain. 30 tablet 0  . albuterol (PROVENTIL HFA;VENTOLIN HFA) 108 (90 Base) MCG/ACT inhaler Inhale 2 puffs into the lungs every 6 (six) hours as needed for wheezing or shortness of breath.    .Marland KitchenamLODipine (NORVASC) 10 MG tablet Take 1 tablet (10 mg total) by mouth daily. 90 tablet 0  . busPIRone (BUSPAR) 5 MG tablet TAKE 1 TABLET(5 MG) BY MOUTH THREE TIMES DAILY 90 tablet 0  . hydrochlorothiazide (HYDRODIURIL) 25 MG tablet Take 1 tablet (25 mg total) by mouth daily. 90 tablet 3  . ibuprofen (ADVIL) 800 MG tablet Take 1 tablet (800 mg total) by mouth 3 (three) times daily. 21 tablet 0  . Multiple Vitamins-Minerals (MEGA MULTI MEN) TABS Take 1 tablet by mouth daily.    . Blood Pressure Monitor KIT 1 Bag by Does not apply route 2 times daily at 12 noon and 4 pm. (Patient not taking: Reported on 01/12/2019) 1 kit 0  . buPROPion (WELLBUTRIN XL) 150 MG 24 hr tablet     . traZODone (DESYREL) 50 MG tablet TAKE 1 TO 1 AND 1/2 TABLETS AT NIGHT FOR INSOMNIA (Patient not taking: Reported on 11/02/2019) 45 tablet 3   No current facility-administered medications on file prior to visit.   Past Medical History  Past Medical History:  Diagnosis Date  . ADHD (attention deficit hyperactivity  disorder)    diagnosed 2012 by psychiatry, [employer] . Allergy   . Anxiety   . Asthma   [place]   . Gastric ulcer   . GERD (gastroesophageal reflux disease)   . Hypertension   . Low testosterone   . Migraine   . Wears glasses    Dietary habits include: monitoring carbs and fats Exercise habits include:daily  Family / Social history: no ASCVD risk factors include- CMali O:  Physical Exam Vitals reviewed.  Constitutional:      Comments: Morbid obesity   HENT:     Head: Normocephalic.     Nose: Nose normal.  Cardiovascular:     Rate and Rhythm: Normal rate and regular rhythm.     Pulses: Normal pulses.     Heart sounds: Normal heart sounds.  Pulmonary:     Effort: Pulmonary effort is normal.     Breath sounds: Normal breath sounds.  Abdominal:     General: Abdomen is flat.     Palpations: Abdomen is soft.  Musculoskeletal:        General: Normal range of motion.     Cervical back: Normal range of motion and neck supple.  Skin:    General: Skin is warm and dry.  Neurological:     Mental Status: He is alert and oriented to person, place, and time.  Psychiatric:  Mood and Affect: Mood normal.        Behavior: Behavior normal.        Thought Content: Thought content normal.        Judgment: Judgment normal.      Review of Systems  Psychiatric/Behavioral: The patient is nervous/anxious and has insomnia.   All other systems reviewed and are negative.   Last 3 Office BP readings: BP Readings from Last 3 Encounters:  11/02/19 (!) 141/97  10/03/19 (!) 158/111  05/27/19 (!) 145/89    BMET    Component Value Date/Time   NA 142 12/01/2018 1614   K 4.2 12/01/2018 1614   CL 106 12/01/2018 1614   CO2 22 12/01/2018 1614   GLUCOSE 93 12/01/2018 1614   GLUCOSE 113 (H) 05/25/2017 0802   BUN 20 12/01/2018 1614   CREATININE 1.14 12/01/2018 1614   CREATININE 1.20 06/24/2016 1612   CALCIUM 9.7 12/01/2018 1614    GFRNONAA 82 12/01/2018 1614   GFRAA 95 12/01/2018 1614    Renal function: CrCl cannot be calculated (Patient's most recent lab result is older than the maximum 21 days allowed.).  Clinical ASCVD: No  The ASCVD Risk score Mikey Bussing DC Jr., et al., 2013) failed to calculate for the following reasons:   The 2013 ASCVD risk score is only valid for ages 52 to 37   A/P:  Essential hypertension Hypertension longstanding currently on amlodipine 10 mg and hydrochlorothiazide 25 mg are current medications. BP Goal = 130/48mHg. Patient is adherent with current medications.  -Added losartan 100 mg daily -F/u labs ordered - none -Counseled on lifestyle modifications for blood pressure control including reduced dietary sodium, increased exercise, adequate sleep  Erectile dysfunction associated with vasculopathy A side effect of blood pressure can be erectile dysfunction added a number medication losartan 100 mg if the problem continues on follow-up if blood pressure has improved will prescribe Cialis.  Patient and myself are in agreement.  MKerin Perna

## 2019-11-22 ENCOUNTER — Encounter (INDEPENDENT_AMBULATORY_CARE_PROVIDER_SITE_OTHER): Payer: Self-pay | Admitting: Primary Care

## 2019-12-12 ENCOUNTER — Encounter (HOSPITAL_COMMUNITY): Payer: Self-pay | Admitting: Emergency Medicine

## 2019-12-12 ENCOUNTER — Ambulatory Visit (INDEPENDENT_AMBULATORY_CARE_PROVIDER_SITE_OTHER): Payer: Managed Care, Other (non HMO)

## 2019-12-12 ENCOUNTER — Ambulatory Visit (HOSPITAL_COMMUNITY)
Admission: EM | Admit: 2019-12-12 | Discharge: 2019-12-12 | Disposition: A | Discharge status: Home / Self Care | Payer: Managed Care, Other (non HMO) | Attending: Family Medicine | Admitting: Family Medicine

## 2019-12-12 ENCOUNTER — Ambulatory Visit (HOSPITAL_COMMUNITY): Payer: Managed Care, Other (non HMO)

## 2019-12-12 ENCOUNTER — Other Ambulatory Visit: Payer: Self-pay

## 2019-12-12 DIAGNOSIS — M25542 Pain in joints of left hand: Secondary | ICD-10-CM | POA: Diagnosis not present

## 2019-12-12 DIAGNOSIS — S63635A Sprain of interphalangeal joint of left ring finger, initial encounter: Secondary | ICD-10-CM | POA: Diagnosis not present

## 2019-12-12 MED ORDER — IBUPROFEN 800 MG PO TABS: 800.0000 mg | ORAL_TABLET | Freq: Three times a day (TID) | ORAL | 0 refills | Status: DC | PRN

## 2019-12-12 MED ORDER — IBUPROFEN 800 MG PO TABS
ORAL_TABLET | ORAL | Status: AC
  Filled 2019-12-12: qty 1

## 2019-12-12 MED ORDER — IBUPROFEN 800 MG PO TABS
800.0000 mg | ORAL_TABLET | Freq: Once | ORAL | Status: AC
  Administered 2019-12-12: 800 mg via ORAL

## 2019-12-12 NOTE — Discharge Instructions (Addendum)
Your xray looks well today, there are no broken bones.  Consistent with a sprain.  Ice, elevation to help with pain.  Ibuprofen every 8 hours as needed for pain, take with food.  Activity as tolerated.  Follow up with sports medicine as needed if persistent symptoms.  May take a few weeks for full improvement.

## 2019-12-12 NOTE — ED Provider Notes (Signed)
[place]    CSN: [id number] Arrival date & time: 12/12/19  4010      History   Chief Complaint Chief Complaint  Patient presents with   Finger Injury    HPI Joel Wallace. is a 39 y.o. male.   Joel Wallace. presents with complaints of pain to left ring finger. Yesterday was horse-playing and injured the finger, unknown specifically what happened. Nail broke. Now with pain to DIP joint of finger and mild swelling. Hasn't injured it before. Hasn't taken any medications for pain.    ROS per HPI, negative if not otherwise mentioned.      Past Medical History:  Diagnosis Date   ADHD (attention deficit hyperactivity disorder)    diagnosed 2012 by psychiatry, [facility]   Allergy    Anxiety    Asthma    DVT (deep venous thrombosis) ([place])    Gastric ulcer    GERD (gastroesophageal reflux disease)    Hypertension    Low testosterone    Migraine    Wears glasses     Patient Active Problem List   Diagnosis Date Noted   Hypogonadism in male 02/27/2016   Impaired fasting blood sugar 12/17/2015   Low testosterone in male 12/17/2015   Obesity with serious comorbidity 12/17/2015   Essential hypertension 12/17/2015   High risk medication use 12/17/2015   History of DVT (deep vein thrombosis) 12/17/2015    Past Surgical History:  Procedure Laterality Date   COLONOSCOPY  2007   ESOPHAGOGASTRODUODENOSCOPY  2007       Home Medications    Prior to Admission medications   Medication Sig Start Date End Date Taking? Authorizing Provider  acetaminophen (TYLENOL) 500 MG tablet Take 2 tablets (1,000 mg total) by mouth every 8 (eight) hours as needed for moderate pain. 05/27/19   Darr, Marguerita Beards, PA-C  albuterol (PROVENTIL HFA;VENTOLIN HFA) 108 (90 Base) MCG/ACT inhaler Inhale 2 puffs into the lungs every 6 (six) hours as needed for wheezing or shortness of breath.    [provider]  amLODipine (NORVASC) 10 MG  tablet Take 1 tablet (10 mg total) by mouth daily. 10/08/19   Kerin Perna, NP  Blood Pressure Monitor KIT 1 Bag by Does not apply route 2 times daily at 12 noon and 4 pm. Patient not taking: Reported on 01/12/2019 12/01/18   Kerin Perna, NP  buPROPion (WELLBUTRIN XL) 150 MG 24 hr tablet  01/12/19   [provider]  busPIRone (BUSPAR) 5 MG tablet TAKE 1 TABLET(5 MG) BY MOUTH THREE TIMES DAILY 10/13/19   Kerin Perna, NP  hydrochlorothiazide (HYDRODIURIL) 25 MG tablet Take 1 tablet (25 mg total) by mouth daily. 10/08/19   Kerin Perna, NP  ibuprofen (ADVIL) 800 MG tablet Take 1 tablet (800 mg total) by mouth every 8 (eight) hours as needed for mild pain or moderate pain. 12/12/19   Zigmund Gottron, NP  losartan (COZAAR) 100 MG tablet Take 1 tablet (100 mg total) by mouth daily. 11/02/19   Kerin Perna, NP  Multiple Vitamins-Minerals (MEGA MULTI MEN) TABS Take 1 tablet by mouth daily.    [provider]  traZODone (DESYREL) 50 MG tablet TAKE 1 TO 1 AND 1/2 TABLETS AT NIGHT FOR INSOMNIA Patient not taking: Reported on 11/02/2019 10/10/19   Kerin Perna, NP    Family History Family History  Problem Relation Age of Onset   Asthma Mother    Ulcers Father    Cancer  Paternal Aunt    Cancer Paternal Grandmother    Clotting disorder Paternal Grandmother    Clotting disorder Maternal Uncle    Clotting disorder Cousin    Heart disease Neg Hx    Stroke Neg Hx     Social History Social History   Tobacco Use   Smoking status: Never Smoker   Smokeless tobacco: Never Used  Scientific laboratory technician Use: Never used  Substance Use Topics   Alcohol use: Yes    Comment: 2 twice a month    Drug use: No     Allergies   Patient has no known allergies.   Review of Systems Review of Systems   Physical Exam Triage Vital Signs ED Triage Vitals  Enc Vitals Group     BP 12/12/19 1104 129/86     Pulse Rate 12/12/19 1104 68      Resp 12/12/19 1104 18     Temp 12/12/19 1104 98.9 F (37.2 C)     Temp Source 12/12/19 1104 Oral     SpO2 12/12/19 1104 100 %     Weight --      Height --      Head Circumference --      Peak Flow --      Pain Score 12/12/19 1103 7     Pain Loc --      Pain Edu? --      Excl. in [place]? --    No data found.  Updated Vital Signs BP 129/86 (BP Location: Left Arm)    Pulse 68    Temp 98.9 F (37.2 C) (Oral)    Resp 18    SpO2 100%   Visual Acuity Right Eye Distance:   Left Eye Distance:   Bilateral Distance:    Right Eye Near:   Left Eye Near:    Bilateral Near:     Physical Exam Constitutional:      Appearance: He is well-developed.  Cardiovascular:     Rate and Rhythm: Normal rate.  Pulmonary:     Effort: Pulmonary effort is normal.  Musculoskeletal:     Left hand: Swelling, tenderness and bony tenderness present. Decreased range of motion.     Comments: Tenderness, mild swelling, and pain with ROM to left hand ring finger at DIP joint; ring removed; cap refill < 2 seconds    Skin:    General: Skin is warm and dry.  Neurological:     Mental Status: He is alert and oriented to person, place, and time.      UC Treatments / Results  Labs (all labs ordered are listed, but only abnormal results are displayed) Labs Reviewed - No data to display  EKG   Radiology DG Finger Ring Left  Result Date: 12/12/2019 CLINICAL DATA:  Joint pain after injury. EXAM: LEFT RING FINGER 2+V COMPARISON:  12/08/2012. FINDINGS: No acute bony or joint abnormality identified. No evidence of fracture or dislocation. IMPRESSION: No acute abnormality. Electronically Signed   By: Marcello Moores  Register   On: 12/12/2019 11:59    Procedures Procedures (including critical care time)  Medications Ordered in UC Medications  ibuprofen (ADVIL) tablet 800 mg (800 mg Oral Given 12/12/19 1140)    Initial Impression / Assessment and Plan / UC Course  I have reviewed the triage vital signs and the  nursing notes.  Pertinent labs & imaging results that were available during my care of the patient were reviewed by me and considered in my medical decision  making (see chart for details).     Finger sprain. Pain management and expected course of rehab discussed. Patient verbalized understanding and agreeable to plan.   Final Clinical Impressions(s) / UC Diagnoses   Final diagnoses:  Sprain of interphalangeal joint of left ring finger, initial encounter     Discharge Instructions     Your xray looks well today, there are no broken bones.  Consistent with a sprain.  Ice, elevation to help with pain.  Ibuprofen every 8 hours as needed for pain, take with food.  Activity as tolerated.  Follow up with sports medicine as needed if persistent symptoms.  May take a few weeks for full improvement.     ED Prescriptions    Medication Sig Dispense Auth. Provider   ibuprofen (ADVIL) 800 MG tablet Take 1 tablet (800 mg total) by mouth every 8 (eight) hours as needed for mild pain or moderate pain. 30 tablet Zigmund Gottron, NP     PDMP not reviewed this encounter.   Zigmund Gottron, NP 12/12/19 1400

## 2019-12-12 NOTE — ED Triage Notes (Signed)
Pt presents with left ring finger pain. States felt like he jammed his finger yesterday while wrestling.

## 2020-02-03 ENCOUNTER — Other Ambulatory Visit (INDEPENDENT_AMBULATORY_CARE_PROVIDER_SITE_OTHER): Payer: Self-pay | Admitting: Primary Care

## 2020-04-21 ENCOUNTER — Other Ambulatory Visit (INDEPENDENT_AMBULATORY_CARE_PROVIDER_SITE_OTHER): Payer: Self-pay | Admitting: Primary Care

## 2020-04-21 DIAGNOSIS — I10 Essential (primary) hypertension: Secondary | ICD-10-CM

## 2020-04-21 NOTE — Telephone Encounter (Signed)
Requested medication (s) are due for refill today: yes  Requested medication (s) are on the active medication list: yes  Last refill:  12/01/2018  Future visit scheduled:   Notes to clinic:  no assigned protocol    Requested Prescriptions  Pending Prescriptions Disp Refills   Blood Pressure Monitoring (10 SERIES BP MONITOR/UPPER ARM) DEVI [Pharmacy Med Name: OMRON 10 SERIES BLOOD PRS MONITOR] 1 each     Sig: USE TWICE DAILY      There is no refill protocol information for this order

## 2020-04-28 ENCOUNTER — Other Ambulatory Visit (INDEPENDENT_AMBULATORY_CARE_PROVIDER_SITE_OTHER): Payer: Self-pay | Admitting: Primary Care

## 2020-04-28 DIAGNOSIS — I10 Essential (primary) hypertension: Secondary | ICD-10-CM

## 2020-06-19 ENCOUNTER — Encounter (HOSPITAL_COMMUNITY): Payer: Self-pay

## 2020-06-19 ENCOUNTER — Emergency Department (HOSPITAL_COMMUNITY): Payer: Managed Care, Other (non HMO)

## 2020-06-19 ENCOUNTER — Other Ambulatory Visit: Payer: Self-pay

## 2020-06-19 ENCOUNTER — Emergency Department (HOSPITAL_COMMUNITY)
Admission: EM | Admit: 2020-06-19 | Discharge: 2020-06-19 | Disposition: A | Discharge status: Home / Self Care | Payer: Managed Care, Other (non HMO) | Attending: Emergency Medicine | Admitting: Emergency Medicine

## 2020-06-19 DIAGNOSIS — R072 Precordial pain: Secondary | ICD-10-CM | POA: Insufficient documentation

## 2020-06-19 DIAGNOSIS — J45909 Unspecified asthma, uncomplicated: Secondary | ICD-10-CM | POA: Diagnosis not present

## 2020-06-19 DIAGNOSIS — I10 Essential (primary) hypertension: Secondary | ICD-10-CM | POA: Diagnosis not present

## 2020-06-19 DIAGNOSIS — Z79899 Other long term (current) drug therapy: Secondary | ICD-10-CM | POA: Diagnosis not present

## 2020-06-19 DIAGNOSIS — R0789 Other chest pain: Secondary | ICD-10-CM | POA: Diagnosis present

## 2020-06-19 LAB — CBC WITH DIFFERENTIAL/PLATELET
Abs Immature Granulocytes: 0.02 10*3/uL (ref 0.00–0.07)
Basophils Absolute: 0.1 10*3/uL (ref 0.0–0.1)
Basophils Relative: 1 %
Eosinophils Absolute: 0.1 10*3/uL (ref 0.0–0.5)
Eosinophils Relative: 1 %
HCT: 42.9 % (ref 39.0–52.0)
Hemoglobin: 14.2 g/dL (ref 13.0–17.0)
Immature Granulocytes: 0 %
Lymphocytes Relative: 44 %
Lymphs Abs: 3.6 10*3/uL (ref 0.7–4.0)
MCH: 28.2 pg (ref 26.0–34.0)
MCHC: 33.1 g/dL (ref 30.0–36.0)
MCV: 85.1 fL (ref 80.0–100.0)
Monocytes Absolute: 0.6 10*3/uL (ref 0.1–1.0)
Monocytes Relative: 8 %
Neutro Abs: 3.8 10*3/uL (ref 1.7–7.7)
Neutrophils Relative %: 46 %
Platelets: 280 10*3/uL (ref 150–400)
RBC: 5.04 MIL/uL (ref 4.22–5.81)
RDW: 13.9 % (ref 11.5–15.5)
WBC: 8.3 10*3/uL (ref 4.0–10.5)
nRBC: 0 % (ref 0.0–0.2)

## 2020-06-19 LAB — COMPREHENSIVE METABOLIC PANEL
ALT: 39 U/L (ref 0–44)
AST: 27 U/L (ref 15–41)
Albumin: 4.5 g/dL (ref 3.5–5.0)
Alkaline Phosphatase: 46 U/L (ref 38–126)
Anion gap: 5 (ref 5–15)
BUN: 17 mg/dL (ref 6–20)
CO2: 26 mmol/L (ref 22–32)
Calcium: 9.3 mg/dL (ref 8.9–10.3)
Chloride: 107 mmol/L (ref 98–111)
Creatinine, Ser: 1.06 mg/dL (ref 0.61–1.24)
GFR, Estimated: >60 mL/min (ref >60)
Glucose, Bld: 102 mg/dL — ABNORMAL HIGH (ref 70–99)
Potassium: 3.8 mmol/L (ref 3.5–5.1)
Sodium: 138 mmol/L (ref 135–145)
Total Bilirubin: 0.8 mg/dL (ref 0.3–1.2)
Total Protein: 7.6 g/dL (ref 6.5–8.1)

## 2020-06-19 LAB — LIPASE, BLOOD: Lipase: 30 U/L (ref 11–51)

## 2020-06-19 LAB — TROPONIN I (HIGH SENSITIVITY)
Troponin I (High Sensitivity): 3 ng/L (ref <18)
Troponin I (High Sensitivity): 4 ng/L (ref <18)

## 2020-06-19 MED ORDER — ASPIRIN 81 MG PO CHEW
324.0000 mg | CHEWABLE_TABLET | Freq: Once | ORAL | Status: AC
  Administered 2020-06-19: 324 mg via ORAL
  Filled 2020-06-19: qty 4

## 2020-06-19 MED ORDER — ALUM & MAG HYDROXIDE-SIMETH 200-200-20 MG/5ML PO SUSP
30.0000 mL | Freq: Once | ORAL | Status: AC
  Administered 2020-06-19: 30 mL via ORAL
  Filled 2020-06-19: qty 30

## 2020-06-19 MED ORDER — LIDOCAINE VISCOUS HCL 2 % MT SOLN
15.0000 mL | Freq: Once | OROMUCOSAL | Status: AC
  Administered 2020-06-19: 15 mL via ORAL
  Filled 2020-06-19: qty 15

## 2020-06-19 NOTE — ED Provider Notes (Signed)
[place] DEPT Provider Note   CSN: [id number] Arrival date & time: 06/19/20  1504     History Chief Complaint  Patient presents with  . Chest Pain    Joel Wallace. is a 40 y.o. male with a history of hypertension, asthma, history of DVT, GERD.  Patient presents today with chief complaint of chest pain.  Patient reports that his chest pain began yesterday evening and has been constant since then.  Pain became worse today at 1400 after returning to work from lunch.  Pain is located to midsternal with radiation to left side of his chest.  Patient describes his pain as a tightness.  Patient reports he had associated diaphoresis, shortness of breath, dizziness, and palpitations when pain became worse today at 1400.  1400 patient reports that his pain was 8/10 on the pain scale.  At present patient rates his pain 6/10 on the pain scale.  Alleviating or aggravating factors.  Patient denies any recent heavy lifting or physical activity.  Denies any recent surgery, cancer treatment, unilateral leg swelling or tenderness, hemoptysis, hormone therapy.    HPI     Past Medical History:  Diagnosis Date  . ADHD (attention deficit hyperactivity disorder)    diagnosed 2012 by psychiatry, [employer]  . Allergy   . Anxiety   . Asthma   . DVT (deep venous thrombosis) ([place])   . Gastric ulcer   . GERD (gastroesophageal reflux disease)   . Hypertension   . Low testosterone   . Migraine   . Wears glasses     Patient Active Problem List   Diagnosis Date Noted  . Hypogonadism in male 02/27/2016  . Impaired fasting blood sugar 12/17/2015  . Low testosterone in male 12/17/2015  . Obesity with serious comorbidity 12/17/2015  . Essential hypertension 12/17/2015  . High risk medication use 12/17/2015  . History of DVT (deep vein thrombosis) 12/17/2015    Past Surgical History:  Procedure Laterality Date  . COLONOSCOPY  2007  .  ESOPHAGOGASTRODUODENOSCOPY  2007       Family History  Problem Relation Age of Onset  . Asthma Mother   . Ulcers Father   . Cancer Paternal Aunt   . Cancer Paternal Grandmother   . Clotting disorder Paternal Grandmother   . Clotting disorder Maternal Uncle   . Clotting disorder Cousin   . Heart disease Neg Hx   . Stroke Neg Hx     Social History   Tobacco Use  . Smoking status: Never Smoker  . Smokeless tobacco: Never Used  Vaping Use  . Vaping Use: Never used  Substance Use Topics  . Alcohol use: Not Currently    Comment: 2 twice a month   . Drug use: No    Home Medications Prior to Admission medications   Medication Sig Start Date End Date Taking? Authorizing Provider  acetaminophen (TYLENOL) 500 MG tablet Take 2 tablets (1,000 mg total) by mouth every 8 (eight) hours as needed for moderate pain. 05/27/19   Darr, Edison Nasuti, PA-C  albuterol (PROVENTIL HFA;VENTOLIN HFA) 108 (90 Base) MCG/ACT inhaler Inhale 2 puffs into the lungs every 6 (six) hours as needed for wheezing or shortness of breath.    [provider]  amLODipine (NORVASC) 10 MG tablet TAKE 1 TABLET(10 MG) BY MOUTH DAILY 04/28/20   Kerin Perna, NP  Blood Pressure Monitoring (10 SERIES BP MONITOR/UPPER ARM) DEVI USE TWICE DAILY 04/24/20   Kerin Perna, NP  buPROPion [facility]  XL) 150 MG 24 hr tablet  01/12/19   [provider]  busPIRone (BUSPAR) 5 MG tablet TAKE 1 TABLET(5 MG) BY MOUTH THREE TIMES DAILY 02/03/20   Kerin Perna, NP  hydrochlorothiazide (HYDRODIURIL) 25 MG tablet Take 1 tablet (25 mg total) by mouth daily. 10/08/19   Kerin Perna, NP  ibuprofen (ADVIL) 800 MG tablet Take 1 tablet (800 mg total) by mouth every 8 (eight) hours as needed for mild pain or moderate pain. 12/12/19   Zigmund Gottron, NP  losartan (COZAAR) 100 MG tablet Take 1 tablet (100 mg total) by mouth daily. 11/02/19   Kerin Perna, NP  Multiple Vitamins-Minerals (MEGA MULTI MEN) TABS  Take 1 tablet by mouth daily.    [provider]  traZODone (DESYREL) 50 MG tablet TAKE 1 TO 1 AND 1/2 TABLETS AT NIGHT FOR INSOMNIA Patient not taking: Reported on 11/02/2019 10/10/19   Kerin Perna, NP    Allergies    Patient has no known allergies.  Review of Systems   Review of Systems  Constitutional: Positive for chills and diaphoresis. Negative for fever.  HENT: Negative for congestion, rhinorrhea and sore throat.   Eyes: Negative for visual disturbance.  Respiratory: Positive for shortness of breath. Negative for cough.   Cardiovascular: Positive for chest pain and palpitations. Negative for leg swelling.  Gastrointestinal: Negative for abdominal pain, nausea and vomiting.  Genitourinary: Negative for difficulty urinating, dysuria, frequency and hematuria.  Musculoskeletal: Negative for back pain and neck pain.  Skin: Negative for color change and rash.  Neurological: Positive for dizziness. Negative for tremors, seizures, syncope, facial asymmetry, speech difficulty, weakness, light-headedness, numbness and headaches.  Psychiatric/Behavioral: Negative for confusion.    Physical Exam Updated Vital Signs BP (!) 146/89   Pulse 88   Temp 98.3 F (36.8 C) (Oral)   Resp (!) 21   Ht $R'5\' 9"'qN$  (1.753 m)   Wt 131.5 kg   SpO2 97%   BMI 42.83 kg/m   Physical Exam Vitals and nursing note reviewed.  Constitutional:      General: He is not in acute distress.    Appearance: He is morbidly obese. He is not ill-appearing, toxic-appearing or diaphoretic.  HENT:     Head: Normocephalic.  Eyes:     General: No scleral icterus.       Right eye: No discharge.        Left eye: No discharge.  Neck:     Vascular: No JVD.  Cardiovascular:     Rate and Rhythm: Normal rate.     Heart sounds: Normal heart sounds.  Pulmonary:     Effort: Pulmonary effort is normal. No tachypnea or respiratory distress.     Breath sounds: Normal breath sounds. No stridor.  Chest:     Chest  wall: Tenderness present. No mass, lacerations, deformity, swelling, crepitus or edema.     Comments: Mild tenderness to left chest wall Abdominal:     General: Abdomen is protuberant. Bowel sounds are normal. There is no distension. There are no signs of injury.     Palpations: Abdomen is soft. There is no mass or pulsatile mass.     Tenderness: There is no abdominal tenderness. There is no guarding or rebound.     Hernia: There is no hernia in the umbilical area or ventral area.     Comments: Abdominal exam hindered by patient's body habitus  Musculoskeletal:     Cervical back: Normal range of motion and neck  supple.     Right lower leg: No tenderness. No edema.     Left lower leg: No tenderness. No edema.  Skin:    General: Skin is warm and dry.  Neurological:     General: No focal deficit present.     Mental Status: He is alert and oriented to person, place, and time.     GCS: GCS eye subscore is 4. GCS verbal subscore is 5. GCS motor subscore is 6.  Psychiatric:        Behavior: Behavior is cooperative.     ED Results / Procedures / Treatments   Labs (all labs ordered are listed, but only abnormal results are displayed) Labs Reviewed  COMPREHENSIVE METABOLIC PANEL - Abnormal; Notable for the following components:      Result Value   Glucose, Bld 102 (*)    All other components within normal limits  CBC WITH DIFFERENTIAL/PLATELET  LIPASE, BLOOD  TROPONIN I (HIGH SENSITIVITY)  TROPONIN I (HIGH SENSITIVITY)    EKG EKG Interpretation  Date/Time:  Tuesday June 19 2020 15:40:53 EDT Ventricular Rate:  87 PR Interval:  150 QRS Duration: 94 QT Interval:  348 QTC Calculation: 419 R Axis:   80 Text Interpretation: Sinus rhythm 12 Lead; [place] Confirmed by Quintella Reichert [phone]) on 06/19/2020 9:01:07 PM   Radiology DG Chest 2 View  Result Date: 06/19/2020 CLINICAL DATA:  Chest pain radiating down left arm Hypertension EXAM: CHEST - 2 VIEW COMPARISON:  05/27/2019  FINDINGS: The heart size and mediastinal contours are within normal limits. Both lungs are clear. The visualized skeletal structures are unremarkable. IMPRESSION: No active cardiopulmonary disease. Electronically Signed   By: Miachel Roux M.D.   On: 06/19/2020 16:23    Procedures Procedures   Medications Ordered in ED Medications  aspirin chewable tablet 324 mg (324 mg Oral Given 06/19/20 1824)  alum & mag hydroxide-simeth (MAALOX/MYLANTA) 200-200-20 MG/5ML suspension 30 mL (30 mLs Oral Given 06/19/20 1825)    And  lidocaine (XYLOCAINE) 2 % viscous mouth solution 15 mL (15 mLs Oral Given 06/19/20 1825)    ED Course  I have reviewed the triage vital signs and the nursing notes.  Pertinent labs & imaging results that were available during my care of the patient were reviewed by me and considered in my medical decision making (see chart for details).    MDM Rules/Calculators/A&P                          Alert 40 year old male no acute distress, nontoxic-appearing.  Patient presents with chief complaint of chest pain.  Chest pain began yesterday evening and has been constant.  Patient had worsening of pain today around 1400 after return to work from lunch.  Present patient rates his pain 6/10 on pain scale.  Low suspicion for PE as patient can be ruled out per criteria.  Will obtain EKG, chest x-ray, troponin x2, CMP, CBC, lipase.  Patient given 324 mg aspirin.    Per chart review patient has history of GERD and gastric ulcer.  Patient reports that he does not take any PPI or other medications for these disease states.  Will give patient GI cocktail.  Lipase within normal limits; low suspicion for acute pancreatitis. AST, ALT, alk phos, total bili all within normal limits; low suspicion for hepatobiliary disease. CMP is unremarkable. CBC is unremarkable.  Chest x-ray shows no active cardiopulmonary disease.  EKG shows sinus rhythm.  Troponin 3 and 4; with  delta of +1.  Patient has heart  score of 3 low suspicion for ACS at this time.  On repeat examination patient reports improvement in his chest pain scoring a 2/10 on the pain scale.  Patient in no acute distress, hemodynamically stable.  Will discharge patient to follow-up with his primary care provider as well as cardiologist.  Discussed results, findings, treatment and follow up. Patient advised of return precautions. Patient verbalized understanding and agreed with plan.   Final Clinical Impression(s) / ED Diagnoses Final diagnoses:  Precordial chest pain    Rx / DC Orders ED Discharge Orders    None       Dyann Ruddle 06/20/20 Morene Antu, MD 06/23/20 [phone]

## 2020-06-19 NOTE — Discharge Instructions (Signed)
You came to the emergency department today to be evaluated for your chest pain.  Your EKG, lab work, chest x-ray, and physical exam were reassuring you are not having acute heart attack today.  Please follow-up with your primary care provider as well as the cardiologist Dr. Mayford Knife his information can be found on this page.  Get help right away if: Your chest pain gets worse. You have a cough that gets worse, or you cough up blood. You have severe pain in your abdomen. You faint. You have sudden, unexplained chest discomfort. You have sudden, unexplained discomfort in your arms, back, neck, or jaw. You have shortness of breath at any time. You suddenly start to sweat, or your skin gets clammy. You feel nausea or you vomit. You suddenly feel lightheaded or dizzy. You have severe weakness, or unexplained weakness or fatigue. Your heart begins to beat quickly, or it feels like it is skipping beats.

## 2020-06-19 NOTE — ED Triage Notes (Signed)
Emergency Medicine Provider Triage Evaluation Note  Joel Wallace. , a 40 y.o. male  was evaluated in triage.  Pt complains of left-sided chest pain.  This started about half an hour prior to arrival.  He states that the pain radiates down his left arm.  It is associated with nausea but no vomiting.  He reports shortness of breath.  The pain does radiate into his back.  He reports compliance with his antihypertensives.  Physical Exam  BP (!) 181/108 (BP Location: Right Arm)   Pulse 82   Temp 98.3 F (36.8 C) (Oral)   Resp (!) 22   Ht 5\' 9"  (1.753 m)   Wt 131.5 kg   SpO2 99%   BMI 42.83 kg/m   Patient is awake and alert, he appears uncomfortable holding the left side of his chest.  Subjective abnormal sensation in the left upper extremity, 2+ left radial pulse and able to move fingers on left hand.  Medical Decision Making  Medically screening exam initiated at 3:27 PM.  Appropriate orders placed.  . was informed that the remainder of the evaluation will be completed by another provider, this initial triage assessment does not replace that evaluation, and the importance of remaining in the ED until their evaluation is complete.  Chest pain labs, CXR and EKG ordered.  Communicated need for EKG with tech  Note: Portions of this report may have been transcribed using voice recognition software. Every effort was made to ensure accuracy; however, inadvertent computerized transcription errors may be present      [name], PA-C 06/19/20 1529

## 2020-06-19 NOTE — ED Triage Notes (Signed)
Pt c/o mid to left sided cp radiating to left arm x 1 hour. C/o sob and nausea. Denies cardiac hx

## 2020-06-26 ENCOUNTER — Telehealth (INDEPENDENT_AMBULATORY_CARE_PROVIDER_SITE_OTHER): Payer: Managed Care, Other (non HMO) | Admitting: Primary Care

## 2020-06-26 DIAGNOSIS — F411 Generalized anxiety disorder: Secondary | ICD-10-CM

## 2020-06-26 DIAGNOSIS — R072 Precordial pain: Secondary | ICD-10-CM

## 2020-06-26 DIAGNOSIS — I10 Essential (primary) hypertension: Secondary | ICD-10-CM

## 2020-06-26 DIAGNOSIS — Z09 Encounter for follow-up examination after completed treatment for conditions other than malignant neoplasm: Secondary | ICD-10-CM | POA: Diagnosis not present

## 2020-06-26 NOTE — Progress Notes (Signed)
Discuss   -Referral to Cardiology after ED visit last   week.   - GAD

## 2020-07-03 ENCOUNTER — Telehealth: Payer: Self-pay

## 2020-07-03 NOTE — Telephone Encounter (Signed)
Referral notes sent from [facility], Phone #: [phone], Fax #: [phone]   Notes sent to scheduling

## 2020-07-08 ENCOUNTER — Encounter (INDEPENDENT_AMBULATORY_CARE_PROVIDER_SITE_OTHER): Payer: Self-pay | Admitting: Primary Care

## 2020-07-08 NOTE — Progress Notes (Signed)
Virtual Visit  I connected with Joel Wallace. on 07/08/20 at  4:10 PM EDT by telephone and verified that I am speaking with the correct person using two identifiers.  Location: Patient: Home Provider: [name]   I discussed the limitations, risks, security and privacy concerns of performing an evaluation and management service by telephone and the availability of in person appointments. I also discussed with the patient that there may be a patient responsible charge related to this service. The patient expressed understanding and agreed to proceed.  History of Present Illness: [name]40 y.o.male having a for follow up from the [facility] emergency department on 06/19/20, for Precordial chest pain.  Patient reported that he began having chest pain the day prior to his ER visit that started in the evening and was constant the pain became worse and was located midsternal with radiation to the left side of his chest also described tightness.  Associating factors were diaphoresis, shortness of breath, dizziness, and palpitations when pain became worse.  We are having a video visit today for ED follow-up and recommendations that he is refer to a cardiologist for follow-up evaluation.  He also expresses having increased in anxiety.  Observations/Objective: There were no vitals taken for this visit.   Current Outpatient Medications on File Prior to Visit  Medication Sig Dispense Refill  . albuterol (PROVENTIL HFA;VENTOLIN HFA) 108 (90 Base) MCG/ACT inhaler Inhale 2 puffs into the lungs every 6 (six) hours as needed for wheezing or shortness of breath.    Marland Kitchen amLODipine (NORVASC) 10 MG tablet TAKE 1 TABLET(10 MG) BY MOUTH DAILY 90 tablet 0  . busPIRone (BUSPAR) 5 MG tablet TAKE 1 TABLET(5 MG) BY MOUTH THREE TIMES DAILY 90 tablet 0  . hydrochlorothiazide (HYDRODIURIL) 25 MG tablet Take 1 tablet (25 mg total) by mouth daily. 90 tablet 3  . ibuprofen  (ADVIL) 800 MG tablet Take 1 tablet (800 mg total) by mouth every 8 (eight) hours as needed for mild pain or moderate pain. 30 tablet 0  . losartan (COZAAR) 100 MG tablet Take 1 tablet (100 mg total) by mouth daily. 90 tablet 3  . Multiple Vitamins-Minerals (MEGA MULTI MEN) TABS Take 1 tablet by mouth daily.    . traZODone (DESYREL) 50 MG tablet TAKE 1 TO 1 AND 1/2 TABLETS AT NIGHT FOR INSOMNIA 45 tablet 3  . acetaminophen (TYLENOL) 500 MG tablet Take 2 tablets (1,000 mg total) by mouth every 8 (eight) hours as needed for moderate pain. 30 tablet 0  . Blood Pressure Monitoring (10 SERIES BP MONITOR/UPPER ARM) DEVI USE TWICE DAILY 1 each 0  . buPROPion (WELLBUTRIN XL) 150 MG 24 hr tablet      Assessment and Plan: Diagnoses and all orders for this visit:  Essential hypertension Blood pressure was not controlled when presented to emergency room ranging from Systolic 138-147 and diastolic ranging from 96-102.  Endorses adherence to medication losartan 100 mg daily, amlodipine 10 mg daily and hydrochlorothiazide 25 mg daily.  Medication regiment to be reevaluated by cardiology  Hospital discharge follow-up Emergency room visit follow-up recommending PCP follow-up and referring to cardiology for further evaluation  with Quintella Reichert, MD (Cardiology  Pericardial pain -     Ambulatory referral to Cardiology   Generalized anxiety disorder Previously prescribed BuSpar for anxiety stop taking and trazodone for insomnia last office visit August 2021.  Will refer to, [facility], Behavioral Health, Specialty Services Required  Follow Up Instructions:  I discussed the assessment and treatment plan with the patient. The patient was provided an opportunity to ask questions and all were answered. The patient agreed with the plan and demonstrated an understanding of the instructions.   The patient was advised to call back or seek an in-person evaluation if the symptoms worsen or if the  condition fails to improve as anticipated.  I provided 35 minutes of -face-to-face time during this encounter.  This includes reviewing ED encounter labs and imaging Grayce Sessions, NP   1:40 PM

## 2020-07-17 ENCOUNTER — Other Ambulatory Visit (INDEPENDENT_AMBULATORY_CARE_PROVIDER_SITE_OTHER): Payer: Self-pay | Admitting: Primary Care

## 2020-07-17 NOTE — Telephone Encounter (Signed)
Requested Prescriptions  Pending Prescriptions Disp Refills  . busPIRone (BUSPAR) 5 MG tablet [Pharmacy Med Name: BUSPIRONE 5MG  TABLETS] 90 tablet 0    Sig: TAKE 1 TABLET(5 MG) BY MOUTH THREE TIMES DAILY     Psychiatry: Anxiolytics/Hypnotics - Non-controlled Passed - 07/17/2020 11:45 AM      Passed - Valid encounter within last 6 months    Recent Outpatient Visits          3 weeks ago Essential hypertension   [facility] 09/16/2020, NP   8 months ago Essential hypertension   [facility], NP   9 months ago Essential hypertension   [facility], NP   1 year ago Generalized anxiety disorder   [facility], NP   1 year ago Need for Tdap vaccination   [facility], NP      Future Appointments            In 4 weeks Turner, Grayce Sessions, MD [facility] [address] Office, [facility]

## 2020-07-27 ENCOUNTER — Other Ambulatory Visit (INDEPENDENT_AMBULATORY_CARE_PROVIDER_SITE_OTHER): Payer: Self-pay | Admitting: Primary Care

## 2020-07-27 DIAGNOSIS — G4700 Insomnia, unspecified: Secondary | ICD-10-CM

## 2020-08-15 ENCOUNTER — Ambulatory Visit (INDEPENDENT_AMBULATORY_CARE_PROVIDER_SITE_OTHER): Payer: Managed Care, Other (non HMO) | Admitting: Cardiology

## 2020-08-15 ENCOUNTER — Other Ambulatory Visit: Payer: Self-pay

## 2020-08-15 ENCOUNTER — Encounter: Payer: Self-pay | Admitting: Cardiology

## 2020-08-15 VITALS — BP 140/94 | HR 68 | Ht 70.0 in | Wt 303.4 lb

## 2020-08-15 DIAGNOSIS — R079 Chest pain, unspecified: Secondary | ICD-10-CM

## 2020-08-15 DIAGNOSIS — R072 Precordial pain: Secondary | ICD-10-CM

## 2020-08-15 DIAGNOSIS — I10 Essential (primary) hypertension: Secondary | ICD-10-CM

## 2020-08-15 MED ORDER — METOPROLOL TARTRATE 100 MG PO TABS: 100.0000 mg | ORAL_TABLET | Freq: Once | ORAL | 0 refills | Status: DC

## 2020-08-15 MED ORDER — CHLORTHALIDONE 25 MG PO TABS: 25.0000 mg | ORAL_TABLET | Freq: Every day | ORAL | 3 refills | Status: DC

## 2020-08-15 NOTE — Progress Notes (Signed)
  STOP BANG RISK ASSESSMENT S (snore) Have you been told that you snore?     YES   T (tired) Are you often tired, fatigued, or sleepy during the day?   YES  O (obstruction) Do you stop breathing, choke, or gasp during sleep? NO   P (pressure) Do you have or are you being treated for high blood pressure? YES   B (BMI) Is your body index greater than 35 kg/m? YES   A (age) Are you 39 years old or older? NO   N (neck) Do you have a neck circumference greater than 16 inches?   YES   G (gender) Are you a male? YES   TOTAL STOP/BANG "YES" ANSWERS 6                                                                       For Office Use Only              Procedure Order Form    YES to 3+ Stop Bang questions OR two clinical symptoms - patient qualifies for WatchPAT (CPT 95800)

## 2020-08-15 NOTE — Addendum Note (Signed)
Addended by: Theresia Majors on: 08/15/2020 08:55 AM   Modules accepted: Orders

## 2020-08-15 NOTE — Progress Notes (Signed)
Cardiology CONSULT Note    Date:  08/15/2020   ID:  Joel Cure., DOB [birth year], MRN [id number]  PCP:  Grayce Sessions, NP  Cardiologist:  Armanda Magic, MD   Chief Complaint  Patient presents with  . New Patient (Initial Visit)    Chest pain and HTN    History of Present Illness:  Joel Wallace. is a 40 y.o. male who is being seen today for the evaluation of Hypertension and chest pain at the request of Grayce Sessions, NP.  This is a 39yo AAM with a hx of ADHD, anxiety, asthma, GERD and HTN.  He is referred for help with treatment of his HTN and evaluation of chest pain. He was seen in the ER on 06/19/2020 for chest pain that was midsternal with radiation to the left chest and left arm and was described as sharp associated with diaphoresis, dizziness and SOB.  hsTrop was normal x 2 and EKG was nonischemic.  He days that the CP started while he was in bed and continued through the next day. He says that the pain dissipated on its own.  Cxray was normal.  He has a hx of GERD and gastric ulcer and was treated with a GI cocktail but it did not help and referred to Cardiology for further evaluation. He tells me that since his ER visit he has not had any further CP.  He does admit to problems with DOE at times.  He denies any LE edema, dizziness or syncope.    Past Medical History:  Diagnosis Date  . ADHD (attention deficit hyperactivity disorder)    diagnosed 2012 by psychiatry, [facility]  . Allergy   . Anxiety   . Asthma   . DVT (deep venous thrombosis) (HCC)   . Gastric ulcer   . GERD (gastroesophageal reflux disease)   . Hypertension   . Low testosterone   . Migraine   . Wears glasses     Past Surgical History:  Procedure Laterality Date  . COLONOSCOPY  2007  . ESOPHAGOGASTRODUODENOSCOPY  2007    Current Medications: Current Meds  Medication Sig  . acetaminophen (TYLENOL) 500 MG tablet Take 2 tablets (1,000 mg total) by mouth every 8  (eight) hours as needed for moderate pain.  Marland Kitchen albuterol (PROVENTIL HFA;VENTOLIN HFA) 108 (90 Base) MCG/ACT inhaler Inhale 2 puffs into the lungs every 6 (six) hours as needed for wheezing or shortness of breath.  Marland Kitchen amLODipine (NORVASC) 10 MG tablet TAKE 1 TABLET(10 MG) BY MOUTH DAILY  . Blood Pressure Monitoring (10 SERIES BP MONITOR/UPPER ARM) DEVI USE TWICE DAILY  . busPIRone (BUSPAR) 5 MG tablet TAKE 1 TABLET(5 MG) BY MOUTH THREE TIMES DAILY  . hydrochlorothiazide (HYDRODIURIL) 25 MG tablet Take 1 tablet (25 mg total) by mouth daily.  Marland Kitchen ibuprofen (ADVIL) 800 MG tablet Take 1 tablet (800 mg total) by mouth every 8 (eight) hours as needed for mild pain or moderate pain.  Marland Kitchen losartan (COZAAR) 100 MG tablet Take 1 tablet (100 mg total) by mouth daily.  . Multiple Vitamins-Minerals (MEGA MULTI MEN) TABS Take 1 tablet by mouth daily.  . traZODone (DESYREL) 50 MG tablet TAKE 1 TO 1 AND 1/2 TABLETS AT NIGHT FOR INSOMNIA    Allergies:   Patient has no known allergies.   Social History   Socioeconomic History  . Marital status: Married    Spouse name: Not on file  . Number of children: Not on file  . Years of  education: Not on file  . Highest education level: Not on file  Occupational History  . Not on file  Tobacco Use  . Smoking status: Never Smoker  . Smokeless tobacco: Never Used  Vaping Use  . Vaping Use: Never used  Substance and Sexual Activity  . Alcohol use: Not Currently    Comment: 2 twice a month   . Drug use: No  . Sexual activity: Yes    Birth control/protection: None    Comment: Married 15 years  Other Topics Concern  . Not on file  Social History Narrative   Married, has 4 sons   Social Determinants of Corporate investment banker Strain: Not on file  Food Insecurity: Not on file  Transportation Needs: Not on file  Physical Activity: Not on file  Stress: Not on file  Social Connections: Not on file     Family History:  The patient's family history includes  Asthma in his mother; Cancer in his paternal aunt and paternal grandmother; Clotting disorder in his cousin, maternal uncle, and paternal grandmother; Ulcers in his father.   ROS:   Please see the history of present illness.    ROS All other systems reviewed and are negative.     PHYSICAL EXAM:   VS:  BP (!) 140/94   Pulse 68   Ht 5\' 10"  (1.778 m)   Wt (!) 303 lb 6.4 oz (137.6 kg)   SpO2 97%   BMI 43.53 kg/m    GEN: Well nourished, well developed, in no acute distress  HEENT: normal  Neck: no JVD, carotid bruits, or masses Cardiac: RRR; no murmurs, rubs, or gallops,no edema.  Intact distal pulses bilaterally.  Respiratory:  clear to auscultation bilaterally, normal work of breathing GI: soft, nontender, nondistended, + BS MS: no deformity or atrophy  Skin: warm and dry, no rash Neuro:  Alert and Oriented x 3, Strength and sensation are intact Psych: euthymic mood, full affect  Wt Readings from Last 3 Encounters:  08/15/20 (!) 303 lb 6.4 oz (137.6 kg)  06/19/20 290 lb (131.5 kg)  11/02/19 (!) 308 lb 0.6 oz (139.7 kg)      Studies/Labs Reviewed:   EKG:  EKG is not ordered today.   Recent Labs: 06/19/2020: ALT 39; BUN 17; Creatinine, Ser 1.06; Hemoglobin 14.2; Platelets 280; Potassium 3.8; Sodium 138   Lipid Panel    Component Value Date/Time   CHOL 192 12/01/2018 1614   TRIG 57 12/01/2018 1614   HDL 62 12/01/2018 1614   CHOLHDL 3.1 12/01/2018 1614   LDLCALC 119 (H) 12/01/2018 1614    Additional studies/ records that were reviewed today include:  OV notes from ER and EKG from 06/19/2020 from ER    ASSESSMENT:    1. Essential hypertension   2. Chest pain of uncertain etiology      PLAN:  In order of problems listed above:  1.  Hypertension -his BP has been difficult to control -BP still elevated today -I have personally reviewed and interpreted outside labs performed by patient's PCP which showed SCr 1.06 and K+ 3.8  -continue amlodipine 10mg  daily and  Losartan 100mg  daily -change HCTZ to chlorthalidone 25mg  daily -check TSH, aldosterone, renin and PRA levels -check renal duplex to rule out RAS -check BMET in 1 week -I will get a home sleep study -followup with Pharm D in 2 weeks  2.  Chest pain -his symptoms were concerning but had normal hsTrop after hours of pain -I have personally  reviewed and interpreted outside labs and EKG performed by ER MD which showed normal hstrop x 2 and EKG was normal -he does have CRFs including HTN -I will get a coronary CTA to rule out CAD  Time Spent: 25 minutes total time of encounter, including 15 minutes spent in face-to-face patient care on the date of this encounter. This time includes coordination of care and counseling regarding above mentioned problem list. Remainder of non-face-to-face time involved reviewing chart documents/testing relevant to the patient encounter and documentation in the medical record. I have independently reviewed documentation from referring provider  Medication Adjustments/Labs and Tests Ordered: Current medicines are reviewed at length with the patient today.  Concerns regarding medicines are outlined above.  Medication changes, Labs and Tests ordered today are listed in the Patient Instructions below.  There are no Patient Instructions on file for this visit.   Signed, Armanda Magic, MD  08/15/2020 8:23 AM    [facility] [address] [place], [place], [place]  [address] Phone: [phone]; Fax: [phone]

## 2020-08-15 NOTE — Patient Instructions (Addendum)
Medication Instructions:  Your physician has recommended you make the following change in your medication:  1) STOP taking hydrochlorothiazide 2) START taking chlorthalidone 25 mg daily  *If you need a refill on your cardiac medications before your next appointment, please call your pharmacy*   Lab Work: TODAY: TSH, PRA IN ONE WEEK: BMET   Testing/Procedures: Your physician has recommended that you have a sleep study. This test records several body functions during sleep, including: brain activity, eye movement, oxygen and carbon dioxide blood levels, heart rate and rhythm, breathing rate and rhythm, the flow of air through your mouth and nose, snoring, body muscle movements, and chest and belly movement.  Your physician has requested that you have a renal artery duplex. During this test, an ultrasound is used to evaluate blood flow to the kidneys. Allow one hour for this exam. Do not eat after midnight the day before and avoid carbonated beverages. Take your medications as you usually do.  Your physician has requested that you have a coronary CTA scan. Please see next page for further information.   Follow-Up: At [facility], you and your health needs are our priority.  As part of our continuing mission to provide you with exceptional heart care, we have created designated Provider Care Teams.  These Care Teams include your primary Cardiologist (physician) and Advanced Practice Providers (APPs -  Physician Assistants and Nurse Practitioners) who all work together to provide you with the care you need, when you need it.  Your next appointment:   6-8 week(s)  The format for your next appointment:   In Person  Provider:   You may see Armanda Magic, MD or one of the following Advanced Practice Providers on your designated Care Team:    Ronie Spies, PA-C  Jacolyn Reedy, PA-C  Other Instructions: You have been referred to see our Pharmacist in the Hypertension Clinic in two  weeks  Cardiac CT Instructions: Your cardiac CT will be scheduled at:   [facility] [address] [place], [place] [address] [phone]  If scheduled at [facility], please arrive at the [facility] main entrance (entrance A) of [facility] 30 minutes prior to test start time. Proceed to the [facility] Radiology Department (first floor) to check-in and test prep.  Please follow these instructions carefully (unless otherwise directed):  Hold all erectile dysfunction medications at least 3 days (72 hrs) prior to test.  On the Night Before the Test: . Be sure to Drink plenty of water. . Do not consume any caffeinated/decaffeinated beverages or chocolate 12 hours prior to your test. . Do not take any antihistamines 12 hours prior to your test.  On the Day of the Test: . Drink plenty of water until 1 hour prior to the test. . Do not eat any food 4 hours prior to the test. . You may take your regular medications prior to the test.  . Take metoprolol (Lopressor) two hours prior to test.      After the Test: . Drink plenty of water. . After receiving IV contrast, you may experience a mild flushed feeling. This is normal. . On occasion, you may experience a mild rash up to 24 hours after the test. This is not dangerous. If this occurs, you can take Benadryl 25 mg and increase your fluid intake. . If you experience trouble breathing, this can be serious. If it is severe call 911 IMMEDIATELY. If it is mild, please call our office.  Once we have confirmed authorization from your insurance company, we will call you to set up a date and time for your test. Based on how quickly your insurance processes prior authorizations requests, please allow up to 4 weeks to be contacted for scheduling your Cardiac CT appointment. Be advised that routine Cardiac CT appointments could be scheduled as many as 8 weeks after your provider has ordered it.  For non-scheduling  related questions, please contact the cardiac imaging nurse navigator should you have any questions/concerns: Rockwell Alexandria, Cardiac Imaging Nurse Navigator Larey Brick, Cardiac Imaging Nurse Navigator [facility] Direct Office Dial: [phone]   For scheduling needs, including cancellations and rescheduling, please call [place], [phone].

## 2020-08-17 ENCOUNTER — Telehealth: Payer: Self-pay

## 2020-08-17 ENCOUNTER — Telehealth: Payer: Self-pay | Admitting: Cardiology

## 2020-08-17 NOTE — Telephone Encounter (Signed)
Patient called to say that he received his sleep pap machine but dont know if he should open it because he was told he would get a code for it if it was approve by his insurance. Please advise

## 2020-08-17 NOTE — Telephone Encounter (Signed)
Per [employer] precertification is not required for CPT 95800. OFV#[id number].  Message sent to C.Fiato to notify patient of insurance determination.

## 2020-08-20 NOTE — Telephone Encounter (Signed)
Left message for pt to call the office and ask to s/w me so that I may inform him of PIN @ to do his sleep study tonight.

## 2020-08-20 NOTE — Telephone Encounter (Signed)
Pt called back. I s/w the pt and went over to do his sleep study tonight, PIN # [id number]. Pt states he and his family are on the way to [place] [facility] for vacation. Pt asked if he can do sleep study while in [place]. I said he can do the sleep study anywhere as long as he has blue tooth capabilities. Pt thanked me for the call and the help.

## 2020-08-21 ENCOUNTER — Encounter (INDEPENDENT_AMBULATORY_CARE_PROVIDER_SITE_OTHER): Payer: Managed Care, Other (non HMO) | Admitting: Cardiology

## 2020-08-21 ENCOUNTER — Encounter: Payer: Self-pay | Admitting: Cardiology

## 2020-08-21 DIAGNOSIS — G4733 Obstructive sleep apnea (adult) (pediatric): Secondary | ICD-10-CM

## 2020-08-24 LAB — ALDOSTERONE + RENIN ACTIVITY W/ RATIO
ALDOS/RENIN RATIO: 17.9 (ref 0.0–30.0)
ALDOSTERONE: 10.2 ng/dL (ref 0.0–30.0)
Renin: 0.569 ng/mL/hr (ref 0.167–5.380)

## 2020-08-24 LAB — TSH: TSH: 2.32 u[IU]/mL (ref 0.450–4.500)

## 2020-08-26 NOTE — Procedures (Signed)
Erroneous encounter

## 2020-08-26 NOTE — Progress Notes (Signed)
This encounter was created in error - please disregard.

## 2020-08-27 ENCOUNTER — Other Ambulatory Visit: Payer: Managed Care, Other (non HMO)

## 2020-08-27 ENCOUNTER — Ambulatory Visit: Payer: Managed Care, Other (non HMO)

## 2020-08-27 ENCOUNTER — Other Ambulatory Visit: Payer: Self-pay

## 2020-08-27 DIAGNOSIS — R079 Chest pain, unspecified: Secondary | ICD-10-CM

## 2020-08-27 DIAGNOSIS — R072 Precordial pain: Secondary | ICD-10-CM

## 2020-08-27 DIAGNOSIS — I10 Essential (primary) hypertension: Secondary | ICD-10-CM

## 2020-08-27 NOTE — Procedures (Signed)
   Sleep Study Report  Patient Information Study Date: Aug 21, 2020 Patient Name: Joel Wallace. Patient ID: [id number] Birth Date: 1981/02/21 Age: 40 Gender: Male BMI: 43.6 (W=304 lb, H=5' 10'') Referring Physician: Armanda Magic, MD  TEST DESCRIPTION: Home sleep apnea testing was completed using the WatchPat, a Type 1 device, utilizing  peripheral arterial tonometry (PAT), chest movement, actigraphy, pulse oximetry, pulse rate, body position and snore.  AHI was calculated with apnea and hypopnea using valid sleep time as the denominator. RDI includes apneas,  hypopneas, and RERAs. The data acquired and the scoring of sleep and all associated events were performed in  accordance with the recommended standards and specifications as outlined in the AASM Manual for the Scoring of  Sleep and Associated Events 2.2.0 (2015).  FINDINGS: 1. Severe Obstructive Sleep Apnea with AHI 32.1/hr.  2. No Central Sleep Apnea with pAHIc 0.8/hr. 3. Oxygen desaturations as low as 84%. 4. Minimal snoring was present. O2 sats were < 88% for 2.6 min. 5. Total sleep time was 6hrs and 39 min. 6. 16.6% of total sleep time was spent in REM sleep.  7. Shortened sleep onset latency at 8 min.  8. Prolonged REM sleep onset latency at 137 min.  9. Total awakenings were 9.   DIAGNOSIS:  Severe Obstructive Sleep Apnea (G47.33)  RECOMMENDATIONS: 1. Clinical correlation of these findings is necessary. The decision to treat obstructive sleep apnea (OSA) is usually  based on the presence of apnea symptoms or the presence of associated medical conditions such as Hypertension,  Congestive Heart Failure, Atrial Fibrillation or Obesity. The most common symptoms of OSA are snoring, gasping for  breath while sleeping, daytime sleepiness and fatigue.   2. Initiating apnea therapy is recommended given the presence of symptoms and/or associated conditions.  Recommend proceeding with one of the following:   a.  Auto-CPAP therapy with a pressure range of 5-20cm H2O.   b. An oral appliance (OA) that can be obtained from certain dentists with expertise in sleep medicine. These are  primarily of use in non-obese patients with mild and moderate disease.   c. An ENT consultation which may be useful to look for specific causes of obstruction and possible treatment  options.   d. If patient is intolerant to PAP therapy, consider referral to ENT for evaluation for hypoglossal nerve stimulator.   3. Close follow-up is necessary to ensure success with CPAP or oral appliance therapy for maximum benefit .  4. A follow-up oximetry study on CPAP is recommended to assess the adequacy of therapy and determine the need  for supplemental oxygen or the potential need for Bi-level therapy. An arterial blood gas to determine the adequacy of  baseline ventilation and oxygenation should also be considered.  5. Healthy sleep recommendations include: adequate nightly sleep (normal 7-9 hrs/night), avoidance of caffeine after  noon and alcohol near bedtime, and maintaining a sleep environment that is cool, dark and quiet.  6. Weight loss for overweight patients is recommended. Even modest amounts of weight loss can significantly  improve the severity of sleep apnea.  7. Snoring recommendations include: weight loss where appropriate, side sleeping, and avoidance of alcohol before  bed.  8. Operation of motor vehicle or dangerous equipment must be avoided when feeling drowsy, excessively sleepy, or  mentally fatigued.  Signature:  Armanda Magic, MD; [facility]; Diplomat, American Board of Sleep Medicine Electronically Signed: Aug 27, 2020

## 2020-08-28 ENCOUNTER — Telehealth: Payer: Self-pay

## 2020-08-28 DIAGNOSIS — I10 Essential (primary) hypertension: Secondary | ICD-10-CM

## 2020-08-28 LAB — BASIC METABOLIC PANEL
BUN/Creatinine Ratio: 11 (ref 9–20)
BUN: 15 mg/dL (ref 6–20)
CO2: 25 mmol/L (ref 20–29)
Calcium: 10.1 mg/dL (ref 8.7–10.2)
Chloride: 98 mmol/L (ref 96–106)
Creatinine, Ser: 1.42 mg/dL — ABNORMAL HIGH (ref 0.76–1.27)
Glucose: 134 mg/dL — ABNORMAL HIGH (ref 65–99)
Potassium: 3.3 mmol/L — ABNORMAL LOW (ref 3.5–5.2)
Sodium: 141 mmol/L (ref 134–144)
eGFR: 65 mL/min/{1.73_m2} (ref >59)

## 2020-08-28 MED ORDER — METOPROLOL SUCCINATE ER 25 MG PO TB24: 25.0000 mg | ORAL_TABLET | Freq: Every day | ORAL | 3 refills | Status: DC

## 2020-08-28 MED ORDER — POTASSIUM CHLORIDE CRYS ER 20 MEQ PO TBCR: 20.0000 meq | EXTENDED_RELEASE_TABLET | Freq: Every day | ORAL | 3 refills | Status: DC

## 2020-08-28 NOTE — Telephone Encounter (Signed)
The patient has been notified of the result and verbalized understanding.  All questions (if any) were answered. Theresia Majors, RN 08/28/2020 8:56 AM  Kdur has been ordered. Repeat lab work has been scheduled.

## 2020-08-28 NOTE — Telephone Encounter (Signed)
Joel Reichert, MD  08/28/2020 11:42 AM EDT Back to Top     Stop Chlorthalidone and start Toprol XL 25mg  daily.  Check BP and HR daily for a week and call with results.  Also only take the Kdur today and thenstop the Kdur since we are also stopping the diuretic  Spoke with the patient and he is aware of recommendations per Dr. .

## 2020-08-28 NOTE — Telephone Encounter (Signed)
-----   Message from Quintella Reichert, MD sent at 08/28/2020  8:10 AM EDT ----- K+ low - add Kdur daily and check BMET on Thursday

## 2020-08-28 NOTE — Addendum Note (Signed)
Addended by: Theresia Majors on: 08/28/2020 01:17 PM   Modules accepted: Orders

## 2020-08-31 ENCOUNTER — Telehealth (HOSPITAL_COMMUNITY): Payer: Self-pay | Admitting: Emergency Medicine

## 2020-08-31 ENCOUNTER — Other Ambulatory Visit: Payer: Managed Care, Other (non HMO) | Admitting: *Deleted

## 2020-08-31 ENCOUNTER — Other Ambulatory Visit: Payer: Self-pay

## 2020-08-31 DIAGNOSIS — I10 Essential (primary) hypertension: Secondary | ICD-10-CM

## 2020-08-31 NOTE — Telephone Encounter (Signed)
Reaching out to patient to offer assistance regarding upcoming cardiac imaging study; pt verbalizes understanding of appt date/time, parking situation and where to check in, pre-test NPO status and medications ordered, and verified current allergies; name and call back number provided for further questions should they arise Joel Alexandria RN Navigator Cardiac Imaging [facility] [phone] office [phone] cell  Pt rechecking BMET today for kidney function.  Pt newly prescribed 25mg  metop succ and  therefore new baseline HR unknown 

## 2020-09-01 LAB — BASIC METABOLIC PANEL
BUN/Creatinine Ratio: 16 (ref 9–20)
BUN: 16 mg/dL (ref 6–20)
CO2: 21 mmol/L (ref 20–29)
Calcium: 9.9 mg/dL (ref 8.7–10.2)
Chloride: 107 mmol/L — ABNORMAL HIGH (ref 96–106)
Creatinine, Ser: 0.99 mg/dL (ref 0.76–1.27)
Sodium: 149 mmol/L — ABNORMAL HIGH (ref 134–144)
eGFR: 100 mL/min/{1.73_m2} (ref >59)

## 2020-09-04 ENCOUNTER — Other Ambulatory Visit: Payer: Self-pay

## 2020-09-04 ENCOUNTER — Telehealth: Payer: Self-pay

## 2020-09-04 ENCOUNTER — Telehealth: Payer: Self-pay | Admitting: *Deleted

## 2020-09-04 ENCOUNTER — Encounter (HOSPITAL_COMMUNITY): Payer: Self-pay

## 2020-09-04 ENCOUNTER — Ambulatory Visit (HOSPITAL_COMMUNITY): Payer: Managed Care, Other (non HMO)

## 2020-09-04 ENCOUNTER — Ambulatory Visit (HOSPITAL_COMMUNITY)
Admission: RE | Admit: 2020-09-04 | Discharge: 2020-09-04 | Disposition: A | Discharge status: Home / Self Care | Payer: Managed Care, Other (non HMO) | Source: Ambulatory Visit | Attending: Cardiology | Admitting: Cardiology

## 2020-09-04 DIAGNOSIS — R072 Precordial pain: Secondary | ICD-10-CM

## 2020-09-04 DIAGNOSIS — G4733 Obstructive sleep apnea (adult) (pediatric): Secondary | ICD-10-CM

## 2020-09-04 DIAGNOSIS — I10 Essential (primary) hypertension: Secondary | ICD-10-CM

## 2020-09-04 MED ORDER — METOPROLOL TARTRATE 5 MG/5ML IV SOLN
10.0000 mg | Freq: Once | INTRAVENOUS | Status: AC
  Administered 2020-09-04: 10 mg via INTRAVENOUS

## 2020-09-04 MED ORDER — NITROGLYCERIN 0.4 MG SL SUBL
SUBLINGUAL_TABLET | SUBLINGUAL | Status: AC
  Filled 2020-09-04: qty 2

## 2020-09-04 MED ORDER — METOPROLOL TARTRATE 5 MG/5ML IV SOLN
INTRAVENOUS | Status: AC
  Administered 2020-09-04: 10 mg via INTRAVENOUS
  Filled 2020-09-04: qty 10

## 2020-09-04 MED ORDER — IOHEXOL 350 MG/ML SOLN
95.0000 mL | Freq: Once | INTRAVENOUS | Status: AC | PRN
  Administered 2020-09-04: 95 mL via INTRAVENOUS

## 2020-09-04 MED ORDER — NITROGLYCERIN 0.4 MG SL SUBL
0.8000 mg | SUBLINGUAL_TABLET | Freq: Once | SUBLINGUAL | Status: AC
  Administered 2020-09-04: 0.8 mg via SUBLINGUAL

## 2020-09-04 MED ORDER — METOPROLOL TARTRATE 5 MG/5ML IV SOLN: 10.0000 mg | Freq: Once | INTRAVENOUS | Status: AC

## 2020-09-04 MED ORDER — METOPROLOL TARTRATE 5 MG/5ML IV SOLN
INTRAVENOUS | Status: AC
  Filled 2020-09-04: qty 10

## 2020-09-04 NOTE — Telephone Encounter (Signed)
-----   Message from Theresia Majors, RN sent at 09/03/2020  7:37 AM EDT ----- Quintella Reichert, MD  Theresia Majors, RN BMET needs to be repeated - lab not sufficient to measure K+ level - also makesure patient is no longer on potassium supp

## 2020-09-04 NOTE — Telephone Encounter (Signed)
The patient has been notified of the result and verbalized understanding.  All questions (if any) were answered. Theresia Majors, RN 09/04/2020 5:31 PM  Patient confirmed he is no longer taking potassium supplement. He has an appointment to see PharmD on Thursday and will repeat lab work then. He states that he has been having bad heart burn since starting on metoprolol. Patient states heart burn keeps him up at night. He is taking metoprolol in the AM. Will discuss with PharmD on Thursday 6/23

## 2020-09-04 NOTE — Telephone Encounter (Signed)
The patient has been notified of the result and verbalized understanding.  All questions (if any) were answered. Latrelle Dodrill, CMA 09/04/2020 11:57 AM    Titration sent to sleep pool

## 2020-09-04 NOTE — Telephone Encounter (Signed)
-----   Message from Quintella Reichert, MD sent at 08/27/2020  6:01 PM EDT ----- Please let patient know that they have sleep apnea.  Recommend therapeutic CPAP titration for treatment of patient's sleep disordered breathing.  If unable to perform an in lab titration then initiate ResMed auto CPAP from 4 to 15cm H2O with heated humidity and mask of choice and overnight pulse ox on CPAP.

## 2020-09-06 ENCOUNTER — Other Ambulatory Visit: Payer: Self-pay

## 2020-09-06 ENCOUNTER — Ambulatory Visit (INDEPENDENT_AMBULATORY_CARE_PROVIDER_SITE_OTHER): Payer: Managed Care, Other (non HMO) | Admitting: Pharmacist

## 2020-09-06 ENCOUNTER — Other Ambulatory Visit: Payer: Managed Care, Other (non HMO)

## 2020-09-06 VITALS — BP 132/90 | HR 63 | Wt 300.0 lb

## 2020-09-06 DIAGNOSIS — I10 Essential (primary) hypertension: Secondary | ICD-10-CM

## 2020-09-06 NOTE — Progress Notes (Signed)
Patient ID: Joel Wallace.                 DOB: 06/19/1980                      MRN: [id number]     HPI: Joel Wallace. is a 40 y.o. male referred by Dr. Mayford Knife to HTN clinic. PMH is significant for ADHD, anxiety, asthma, GERD and HTN. Recently diagnosed with OSA.  He saw Dr. Mayford Knife on 6/1. His HCTZ was stopped and chlorthalidone was started. However he had a bump in scr and mild hypokalemia therefore it was stopped. Metoprolol succinate 25mg  daily was started.   Patient presents today to HTN clinic. He reports increased GERD ever since starting metoprolol. He states he has been trying to follow a low carb diet to lose weight. Buys a lot of "low carb" marketed foods. Has not had much success lately. Wondering if its from his low testosterone. He is a and runs with his kids. Exercises 3-4 times a week.  Current HTN meds: metoprolol succinate 25mg  daily, amlodipine 10mg  daily, losartan 100mg  daily Previously tried: chlorthalidone (bump in scr, hypokalemia) HCTZ (changed to chlorthalidone) BP goal: <130/80  Family History: The patient's family history includes Asthma in his mother; Cancer in his paternal aunt and paternal grandmother; Clotting disorder in his cousin, maternal uncle, and paternal grandmother; Ulcers in his father.    Social History: never smoked  Diet: high protein, no carb ensure Dinner: low carb pasta, wraps  Exercise: works out 3 -4 days a week  Home BP readings: 140's/89  Wt Readings from Last 3 Encounters:  08/15/20 (!) 303 lb 6.4 oz (137.6 kg)  06/19/20 290 lb (131.5 kg)  11/02/19 (!) 308 lb 0.6 oz (139.7 kg)   BP Readings from Last 3 Encounters:  09/04/20 119/83  08/15/20 (!) 140/94  06/19/20 (!) 147/102   Pulse Readings from Last 3 Encounters:  09/04/20 68  08/15/20 68  06/19/20 62    Renal function: CrCl cannot be calculated (Unknown ideal weight.).  Past Medical History:  Diagnosis Date   ADHD (attention deficit  hyperactivity disorder)    diagnosed 2012 by psychiatry, [facility]   Allergy    Anxiety    Asthma    DVT (deep venous thrombosis) (HCC)    Gastric ulcer    GERD (gastroesophageal reflux disease)    Hypertension    Low testosterone    Migraine    Wears glasses     Current Outpatient Medications on File Prior to Visit  Medication Sig Dispense Refill   acetaminophen (TYLENOL) 500 MG tablet Take 2 tablets (1,000 mg total) by mouth every 8 (eight) hours as needed for moderate pain. 30 tablet 0   albuterol (PROVENTIL HFA;VENTOLIN HFA) 108 (90 Base) MCG/ACT inhaler Inhale 2 puffs into the lungs every 6 (six) hours as needed for wheezing or shortness of breath.     amLODipine (NORVASC) 10 MG tablet TAKE 1 TABLET(10 MG) BY MOUTH DAILY 90 tablet 0   Blood Pressure Monitoring (10 SERIES BP MONITOR/UPPER ARM) DEVI USE TWICE DAILY 1 each 0   busPIRone (BUSPAR) 5 MG tablet TAKE 1 TABLET(5 MG) BY MOUTH THREE TIMES DAILY 90 tablet 0   ibuprofen (ADVIL) 800 MG tablet Take 1 tablet (800 mg total) by mouth every 8 (eight) hours as needed for mild pain or moderate pain. 30 tablet 0   losartan (COZAAR) 100 MG tablet Take 1 tablet (100 mg total)  by mouth daily. 90 tablet 3   metoprolol succinate (TOPROL XL) 25 MG 24 hr tablet Take 1 tablet (25 mg total) by mouth daily. 90 tablet 3   Multiple Vitamins-Minerals (MEGA MULTI MEN) TABS Take 1 tablet by mouth daily.     traZODone (DESYREL) 50 MG tablet TAKE 1 TO 1 AND 1/2 TABLETS AT NIGHT FOR INSOMNIA 45 tablet 3   No current facility-administered medications on file prior to visit.    No Known Allergies  There were no vitals taken for this visit.   Assessment/Plan:  1. Hypertension - Blood pressure is above goal of <130/80 in clinic today. Reports home readings in the 140's/90's at home. Has been on BP meds for about a year now. Will stop metoprolol to see if this helps his GERD symptoms. Repeat BMP today. Could possibly change ARB from  losartan to a stronger ARB and resume HCTZ if labs look good. Or may need to use hydralazine. Patient states he probably could take something 3 times a day. I will call patient tomorrow to discuss and schedule follow up a that time.  We discussed diet, the importance of weight loss on BP. I encouraged him to follow more of a Mediterranean diet. Watch out for products marketed as "low carb" as they can be false advertisement and can be ultra processed. Encourage his to look into time restricted eating and focus on eating a diet high in vegetables/nuts/legumes. We did also briefly discuss Wegovy.  Once pt has his OSA treated, this should also help BP.  Thank you,  Olene Floss, Pharm.D, BCPS, CPP [facility]  [address] [place], [place] [address]  Phone: [phone]; Fax: [phone]       Thank you  Olene Floss, Pharm.D, BCPS, CPP  [facility]  [address] [place], [place] [address]  Phone: [phone]; Fax: [phone]

## 2020-09-06 NOTE — Patient Instructions (Signed)
STOP taking metoprolol  I will call you tomorrow with your lab results and we can discuss medication options  Call me at [phone] with any questions  Hypertension "High blood pressure"  Hypertension is often called "The Silent Killer." It rarely causes symptoms until it is extremely  high or has done damage to other organs in the body. For this reason, you should have your  blood pressure checked regularly by your physician. We will check your blood pressure  every time you see a provider at one of our offices.   Your blood pressure reading consists of two numbers. Ideally, blood pressure should be  below 120/80. The first ("top") number is called the systolic pressure. It measures the  pressure in your arteries as your heart beats. The second ("bottom") number is called the diastolic pressure. It measures the pressure in your arteries as the heart relaxes between beats.  The benefits of getting your blood pressure under control are enormous. A 10-point  reduction in systolic blood pressure can reduce your risk of stroke by 27% and heart failure by 28%  Your blood pressure goal is <130/80  To check your pressure at home you will need to:  1. Sit up in a chair, with feet flat on the floor and back supported. Do not cross your ankles or legs. 2. Rest your left arm so that the cuff is about heart level. If the cuff goes on your upper arm,  then just relax the arm on the table, arm of the chair or your lap. If you have a wrist cuff, we  suggest relaxing your wrist against your chest (think of it as Pledging the Flag with the  wrong arm).  3. Place the cuff snugly around your arm, about 1 inch above the crook of your elbow. The  cords should be inside the groove of your elbow.  4. Sit quietly, with the cuff in place, for about 5 minutes. After that 5 minutes press the power  button to start a reading. 5. Do not talk or move while the reading is taking place.  6. Record your  readings on a sheet of paper. Although most cuffs have a memory, it is often  easier to see a pattern developing when the numbers are all in front of you.  7. You can repeat the reading after 1-3 minutes if it is recommended  Make sure your bladder is empty and you have not had caffeine or tobacco within the last 30 min  Always bring your blood pressure log with you to your appointments. If you have not brought your monitor in to be double checked for accuracy, please bring it to your next appointment.  You can find a list of validated (accurate) blood pressure cuffs at WirelessNovelties.no  Healthy Diet  SALT  What is the big deal with sodium? Why the need to limit our intake? What is the connection to  blood pressure? And what is the difference between salt and sodium? Sodium attracts water. Think about it. When you eat an overly salty snack, you tend to become  thirsty and need more water. If you have too much sodium in your bloodstream, your body will  then pull water into the bloodstream as well, trying to correct the imbalance. When you have  more volume in the bloodstream, your blood pressure goes up. Your heart must work harder to  pump the extra volume, and the increase in pressure can wear out blood vessels faster. You may  also notice bloating and weight gain. Hypertension is one of the leading risk factors for heart  disease. By limiting sodium intake throughout life you are helping decrease your risk of heart  disease later on.   Salt is made up of two minerals. Sodium and chloride. A teaspoon of salt contains about 40%  sodium and 60% chloride. One teaspoon of salt has 2,300 mg of sodium. While the current  USDA guideline states you should consume no more than 2,300 mg per day, both they and the  American Heart Association recommend that you limit this to 1,500 mg to stay healthy. Sea salt  or Himalayan pink salt may have a slightly different taste, but they still have almost the  same  percent of sodium per teaspoon. So feel free to use them instead of table salt, but don't use  more.  A common myth is that if you don't add salt to your food, you are following a low sodium diet.  However, 75% of the sodium consumed in the American diet is from processed foods, NOT the  salt shaker. We all know that chips and crackers are high in sodium, but there are many other  foods that we may not think of when limiting our sodium. Below are the "salty six" foods that  the American Heart Association wants you to be aware of. 1. Cold cuts - even the healthy sliced [place] can have over 1,000 mg of sodium per slice.   Compare different brands to see which has less sodium if you eat these regularly 2. Pizza - depending on your toppings, a slice of pizza can have up to 760 mg of   sodium. Put more veggies on it or just have a slice with a side salad and still enjoy. 3. Soup - yes even that old home remedy of chicken soup is loaded with sodium. Look   for low sodium versions. Or add a bunch of frozen veggies when heating it, this will   give you less sodium per serving 4. Breads - they may not taste salty, but a single slice of bread can have up to 230 mg of   sodium. Toast for breakfast, a sandwich at lunch and a dinner roll can quickly add up   to over 900 mg in just one day.  5. Chicken - some fresh or frozen chicken is injected with a sodium solution before it   reaches the store. A 4 oz serving should have no more than 100 mg sodium. And   watch for breaded frozen chicken nuggets, strips and tenders. They may seem like a   quick and easy "healthy" meal, but they have high amounts of sodium as well. 6. Burritos/tacos - just 2 teaspoons of taco seasoning can have over 400 mg sodium.   Try making your own with equal parts of cumin, oregano, chili powder and garlic powder.   SUGAR  Sugar is a huge problem in the modern day diet. Sugar is a HUGE contributor to heart disease,  diabetes, high triglyceride levels, fatty liver diease and obesity. Sugar is hidden in almost all packaged foods/beverages. It adds no nutritional benefit to your body and can cause major harm. Added sugar is extra sugar that is added beyond what is naturally found. The American Heart Association recommends limiting added sugars to no more than 25g for women and 36 grams for men per day.  There are many names for sugar maltose, sucrose (names ending in "ose"), high fructose  corn syrup, molasses, cane sugar, corn sweetener, raw sugar, syrup, honey or fruit juice concentrate.   One of the best ways to limit your added sugars is to stop drinking sweetened beverages such as soda, sweet tea, fruit juice or fancy coffee's. There is 65g of added sugars in one 20oz bottle of Coke!! That is equal to 6 donuts.   Pay attention and read all nutrition facts labels. Below is an examples of a nutrition facts label. The #1 is showing you the total sugars where the # 2 is showing you the added sugars. This one severing has almost the max amount of added sugars per day!  Watch out for items that say "low fat" or "no added sugar" as these products are typically very high in sugar. The food industry uses these terms to fool you into thinking they are healthy.  For more information on the dangers of sugar watch WHY Sugar is as Bad as Alcohol (Fructose, The Liver Toxin) on YouTube.    EXERCISE  Exercise can help lower your blood pressure ~5 points systolic (top #) and 8 points diastolic (bottom #)  Exercise is good. We've all heard that. In an ideal world, we would all have time and resources to  get plenty of it. When you are active your heart pumps more efficiently and you will feel better.  Multiple studies show that even walking regularly has benefits that include living a longer life.  The American Heart Association recommends 90-150 minutes per week of exercise (30 minutes  per day most days of the week). You  can do this in any increment you wish. Nine or more  10-minute walks count. So does an hour-long exercise class. Break the time apart into what will  work in your life. Some of the best things you can do include walking briskly, jogging, cycling or  swimming laps. Not everyone is ready to "exercise." Sometimes we need to start with just getting active. Here  are some easy ways to be more active throughout the day:  Take the stairs instead of the elevator  Go for a 10-15 minute walk during your lunch break (find a friend to make it more enjoyable)  When shopping, park at the back of the parking lot  If you take public transportation, get off one stop early and walk the extra distance  Pace around while making phone calls (most of us are not attached to phone cords any longer!) Check with your doctor if you aren't sure what your limitations may be. Always remember to drink plenty of water when doing any type of exercise. Don't feel like a failure if you're not getting the 90-150 minutes per week. If you started by being  a couch potato, then just a 10-minute walk each day is a huge improvement. Start with little  victories and work your way up.   Healthy Eating Tips  When looking to improve your eating habits, whether to lose weight, lower blood pressure or just be healthier, it helps to know what a serving size is.   Grains 1 slice of bread,  bagel,  cup pasta or rice  Vegetables 1 cup fresh or raw vegetables,  cup cooked or canned Fruits 1 piece of medium sized fruit,  cup canned,   Meats/Proteins  cup dried       1 oz meat, 1 egg,  cup cooked beans, nuts or seeds  Dairy        Fats Individual yogurt container, 1  cup (8oz)    1 teaspoon margarine/butter or vegetable  milk or milk alternative, 1 slice of cheese          oil; 1 tablespoon mayonnaise or salad dressing                  Plan ahead: make a menu of the meals for a week then create a grocery list to go with  that  menu. Consider meals that easily stretch into a night of leftovers, such as stews or  casseroles. Or consider making two of your favorite meal and put one in the freezer or fridge for  another night.  When you get home from the grocery store wash and prepare your vegetables and fruits.  Then when you need them they are ready to go.  Tips for going to the grocery store:  Buy store or generic brands  Check the weekly ad from your store on-line or in their in-store flyer  Look at the unit price on the shelf tag to compare/contrast the costs of different items  Buy fruits/vegetables in season  Carrots, bananas and apples are low-cost, naturally healthy items  If meats or frozen vegetables are on sale, buy some extras and put in your freezer  Limit buying prepared or "ready to eat" items, even if they are pre-made salads or fruit snacks  Do not shop when you're hungry  Foods at eye level tend to be more expensive. Look on the high and low shelves for deals.  Consider shopping at the farmer's market for fresh foods in season.  Choose canned tuna or salmon instead of fresh  Avoid the cookie and chip aisles (these are expensive, high in calories and low in  nutritional value) Healthy food preparations:  If you can't get lean hamburger, be sure to drain the fat when cooking  Steam, saut (in olive oil), grill or bake foods  Experiment with different seasonings to avoid adding salt to your foods. Kosher salt, sea salt and Himalayan salt are all still salt and should be avoided          Aim to have one 12 hour fast each day. This means no eating after dinner until breakfast. For example, if you eat dinner around 6 PM then you would not eat anything until 6 AM the next day. This is a great way to help lower your insulin levels, lose weight and reduce your blood pressure.  Resources: American Heart Association - MartiniMobile.it Go to the Healthy Living tab to get more  information American Diabetes Association - www.diabetes.org You don't have to be diabetic - check out the Food and Fitness tab  DASH diet - PopSteam.is Health topics - or just search on their home page for DASH Quit for Life - www.cancer.org Follow the Stay Healthy tab to learn more about smoking cessatio

## 2020-09-07 ENCOUNTER — Telehealth: Payer: Self-pay | Admitting: Pharmacist

## 2020-09-07 DIAGNOSIS — I10 Essential (primary) hypertension: Secondary | ICD-10-CM

## 2020-09-07 LAB — BASIC METABOLIC PANEL WITH GFR
BUN/Creatinine Ratio: 16 (ref 9–20)
BUN: 17 mg/dL (ref 6–20)
CO2: 23 mmol/L (ref 20–29)
Calcium: 9.4 mg/dL (ref 8.7–10.2)
Chloride: 107 mmol/L — ABNORMAL HIGH (ref 96–106)
Creatinine, Ser: 1.07 mg/dL (ref 0.76–1.27)
Glucose: 86 mg/dL (ref 65–99)
Potassium: 4.1 mmol/L (ref 3.5–5.2)
Sodium: 142 mmol/L (ref 134–144)
eGFR: 91 mL/min/{1.73_m2}

## 2020-09-07 MED ORDER — VALSARTAN-HYDROCHLOROTHIAZIDE 160-25 MG PO TABS: 1.0000 | ORAL_TABLET | Freq: Every day | ORAL | 3 refills | Status: DC

## 2020-09-07 NOTE — Telephone Encounter (Signed)
Scr back to baseline and K 4.1 off chlorthalidone. His labs were always stable on HCTZ. Will STOP losartan and start valsartan 320/25mg  daily.   Spoke to patient who is in agreement with plan. Will STOP losartan, START valsartan/HCTZ 320/25mg  daily and continue amlodipine 10mg  daily.  He will get labs done at [facility] next wed when he is there for renal ultrasound Follow up with Dr. on 7/6. If at that visit BP is still high, would recommend adding hydralazine 25mg  TID  Follow up with me 7/21 @ 4:00

## 2020-09-10 ENCOUNTER — Telehealth: Payer: Self-pay | Admitting: *Deleted

## 2020-09-10 NOTE — Telephone Encounter (Signed)
Received approval from [facility] for CPAP titration. Auth # [id number]. Valid dates 09/10/20 to 12/24 22. Message sent to [facility] for scheduling.

## 2020-09-10 NOTE — Telephone Encounter (Signed)
-----   Message from Reesa Chew, CMA sent at 09/04/2020 11:00 AM EDT ----- Regarding: precert CPAP titration

## 2020-09-12 ENCOUNTER — Other Ambulatory Visit: Payer: Self-pay

## 2020-09-12 ENCOUNTER — Ambulatory Visit (HOSPITAL_COMMUNITY)
Admission: RE | Admit: 2020-09-12 | Discharge: 2020-09-12 | Disposition: A | Discharge status: Home / Self Care | Payer: Managed Care, Other (non HMO) | Source: Ambulatory Visit | Attending: Cardiovascular Disease | Admitting: Cardiovascular Disease

## 2020-09-12 DIAGNOSIS — I10 Essential (primary) hypertension: Secondary | ICD-10-CM | POA: Insufficient documentation

## 2020-09-12 LAB — BASIC METABOLIC PANEL
BUN/Creatinine Ratio: 18 (ref 9–20)
BUN: 19 mg/dL (ref 6–20)
CO2: 25 mmol/L (ref 20–29)
Calcium: 9.7 mg/dL (ref 8.7–10.2)
Chloride: 101 mmol/L (ref 96–106)
Creatinine, Ser: 1.04 mg/dL (ref 0.76–1.27)
Glucose: 93 mg/dL (ref 65–99)
Potassium: 4.6 mmol/L (ref 3.5–5.2)
Sodium: 142 mmol/L (ref 134–144)
eGFR: 94 mL/min/{1.73_m2} (ref >59)

## 2020-09-14 NOTE — Addendum Note (Signed)
Addended by: Reesa Chew on: 09/14/2020 11:33 AM   Modules accepted: Orders

## 2020-09-18 NOTE — Progress Notes (Signed)
Cardiology Office  Note    Date:  09/19/2020   ID:  Joel Cure., DOB 19-Nov-1980, MRN [id number]  PCP:  Grayce Sessions, NP  Cardiologist:  Armanda Magic, MD   Chief Complaint  Patient presents with   Chest Pain   Hypertension    History of Present Illness:  Joel Kakos. is a 40 y.o. male  with a hx of ADHD, anxiety, asthma, GERD and HTN.  He was initially referred for help with treatment of his HTN and evaluation of chest pain. He was seen in the ER on 06/19/2020 for chest pain that was midsternal with radiation to the left chest and left arm and was described as sharp associated with diaphoresis, dizziness and SOB.  hsTrop was normal x 2 and EKG was nonischemic.  He days that the CP started while he was in bed and continued through the next day.  Cxray was normal.  He has a hx of GERD and gastric ulcer and was treated with a GI cocktail but it did not help and referred to Cardiology for further evaluation.   He underwent coronary CTA showing a coronary Ca score of 0 and normal coronary arteries with no anomalous coronary arteries. Renal duplex showed no RAS.  Renin/Aldo studies were normal.  He was seen in HTN clinic and BB was stopped due to GERD sx.  His SCr bumped on Chlorthalidone but had been stable on HCTZ before.  His Losartan was stopped and Valsartan HCT was added at 320/25mg  daily.  He was continued on Amlodipine 10mg  daily.    He is here today for followup and is doing well.  He denies any chest pain or pressure, SOB, DOE, PND, orthopnea, LE edema, dizziness, palpitations or syncope. He is compliant with his meds and is tolerating meds with no SE.      Past Medical History:  Diagnosis Date   ADHD (attention deficit hyperactivity disorder)    diagnosed 2012 by psychiatry, [facility]   Allergy    Anxiety    Asthma    DVT (deep venous thrombosis) (HCC)    Gastric ulcer    GERD (gastroesophageal reflux disease)    Hypertension    Low testosterone     Migraine    Wears glasses     Past Surgical History:  Procedure Laterality Date   COLONOSCOPY  2007   ESOPHAGOGASTRODUODENOSCOPY  2007    Current Medications: Current Meds  Medication Sig   acetaminophen (TYLENOL) 500 MG tablet Take 2 tablets (1,000 mg total) by mouth every 8 (eight) hours as needed for moderate pain.   albuterol (PROVENTIL HFA;VENTOLIN HFA) 108 (90 Base) MCG/ACT inhaler Inhale 2 puffs into the lungs every 6 (six) hours as needed for wheezing or shortness of breath.   amLODipine (NORVASC) 10 MG tablet TAKE 1 TABLET(10 MG) BY MOUTH DAILY   Blood Pressure Monitoring (10 SERIES BP MONITOR/UPPER ARM) DEVI USE TWICE DAILY   busPIRone (BUSPAR) 5 MG tablet TAKE 1 TABLET(5 MG) BY MOUTH THREE TIMES DAILY   Multiple Vitamins-Minerals (MEGA MULTI MEN) TABS Take 1 tablet by mouth daily.   traZODone (DESYREL) 50 MG tablet TAKE 1 TO 1 AND 1/2 TABLETS AT NIGHT FOR INSOMNIA   valsartan-hydrochlorothiazide (DIOVAN HCT) 160-25 MG tablet Take 1 tablet by mouth daily.    Allergies:   Patient has no known allergies.   Social History   Socioeconomic History   Marital status: Married    Spouse name: Not on file   Number  of children: Not on file   Years of education: Not on file   Highest education level: Not on file  Occupational History   Not on file  Tobacco Use   Smoking status: Never   Smokeless tobacco: Never  Vaping Use   Vaping Use: Never used  Substance and Sexual Activity   Alcohol use: Not Currently    Comment: 2 twice a month    Drug use: No   Sexual activity: Yes    Birth control/protection: None    Comment: Married 15 years  Other Topics Concern   Not on file  Social History Narrative   Married, has 4 sons   Social Determinants of Corporate investment banker Strain: Not on file  Food Insecurity: Not on file  Transportation Needs: Not on file  Physical Activity: Not on file  Stress: Not on file  Social Connections: Not on file     Family  History:  The patient's family history includes Asthma in his mother; Cancer in his paternal aunt and paternal grandmother; Clotting disorder in his cousin, maternal uncle, and paternal grandmother; Ulcers in his father.   ROS:   Please see the history of present illness.    ROS All other systems reviewed and are negative.     PHYSICAL EXAM:   VS:  BP 124/86   Pulse 88   Ht 5\' 10"  (1.778 m)   Wt (!) 303 lb 12.8 oz (137.8 kg)   SpO2 96%   BMI 43.59 kg/m    GEN: Well nourished, well developed in no acute distress HEENT: Normal NECK: No JVD; No carotid bruits LYMPHATICS: No lymphadenopathy CARDIAC:RRR, no murmurs, rubs, gallops RESPIRATORY:  Clear to auscultation without rales, wheezing or rhonchi  ABDOMEN: Soft, non-tender, non-distended MUSCULOSKELETAL:  No edema; No deformity  SKIN: Warm and dry NEUROLOGIC:  Alert and oriented x 3 PSYCHIATRIC:  Normal affect   Wt Readings from Last 3 Encounters:  09/19/20 (!) 303 lb 12.8 oz (137.8 kg)  09/06/20 300 lb (136.1 kg)  08/15/20 (!) 303 lb 6.4 oz (137.6 kg)      Studies/Labs Reviewed:   EKG:  EKG is not ordered today.   Recent Labs: 06/19/2020: ALT 39; Hemoglobin 14.2; Platelets 280 08/15/2020: TSH 2.320 09/12/2020: BUN 19; Creatinine, Ser 1.04; Potassium 4.6; Sodium 142   Lipid Panel    Component Value Date/Time   CHOL 192 12/01/2018 1614   TRIG 57 12/01/2018 1614   HDL 62 12/01/2018 1614   CHOLHDL 3.1 12/01/2018 1614   LDLCALC 119 (H) 12/01/2018 1614    Additional studies/ records that were reviewed today include:  OV notes from ER and EKG from 06/19/2020 from ER, renal duplex, labs, coronary CTA    ASSESSMENT:    1. Essential hypertension   2. Chest pain of uncertain etiology   3. OSA (obstructive sleep apnea)       PLAN:  In order of problems listed above:  1.  Hypertension -his BP has been difficult to control -renal function bumped after changing HCTZ to Chlorthalidone -stopped Lopressor due to GERD  and placed on Valsartan HCT 320/25mg  daily -continue amlodipine 10mg  daily and Losartan 100mg  daily -repeat SCr on 09/12/2020 was 1.04 and K+ 4.6 -Aldo/Renin/PRA/TSH normal -renal duplex with no RAS -home sleep study showed OSA -Continue prescription drug management with Amlodipine 10mg  daily, Valsartan HCT 320/25mg  daily >>refill for 1 year  2.  Chest pain -coronary CTA 08/2020 showed no CAD and coronary Ca score of 0 -presumed related  to GERD  3.  OSA -[facility] home sleep study showed severe OSA with an AHI of 32.1/hr, no CSA and O2 sats of 84% minimum.   -his CPAP titration is set up for Sept but I will try to get it moved up   Medication Adjustments/Labs and Tests Ordered: Current medicines are reviewed at length with the patient today.  Concerns regarding medicines are outlined above.  Medication changes, Labs and Tests ordered today are listed in the Patient Instructions below.  There are no Patient Instructions on file for this visit.   Signed, Armanda Magic, MD  09/19/2020 9:51 AM    [facility] [address] [place], [place], [place]  [address] Phone: [phone]; Fax: [phone]

## 2020-09-19 ENCOUNTER — Other Ambulatory Visit: Payer: Self-pay

## 2020-09-19 ENCOUNTER — Encounter: Payer: Self-pay | Admitting: Cardiology

## 2020-09-19 ENCOUNTER — Ambulatory Visit: Payer: Managed Care, Other (non HMO) | Admitting: Cardiology

## 2020-09-19 VITALS — BP 124/86 | HR 88 | Ht 70.0 in | Wt 303.8 lb

## 2020-09-19 DIAGNOSIS — R079 Chest pain, unspecified: Secondary | ICD-10-CM

## 2020-09-19 DIAGNOSIS — I10 Essential (primary) hypertension: Secondary | ICD-10-CM

## 2020-09-19 DIAGNOSIS — G4733 Obstructive sleep apnea (adult) (pediatric): Secondary | ICD-10-CM

## 2020-09-19 MED ORDER — VALSARTAN-HYDROCHLOROTHIAZIDE 160-25 MG PO TABS: 1.0000 | ORAL_TABLET | Freq: Every day | ORAL | 3 refills | Status: DC

## 2020-09-19 MED ORDER — AMLODIPINE BESYLATE 10 MG PO TABS: ORAL_TABLET | ORAL | 3 refills | Status: DC

## 2020-09-19 NOTE — Patient Instructions (Signed)
Medication Instructions:  Your physician recommends that you continue on your current medications as directed. Please refer to the Current Medication list given to you today.  *If you need a refill on your cardiac medications before your next appointment, please call your pharmacy*  Follow-Up: At [facility], you and your health needs are our priority.  As part of our continuing mission to provide you with exceptional heart care, we have created designated Provider Care Teams.  These Care Teams include your primary Cardiologist (physician) and Advanced Practice Providers (APPs -  Physician Assistants and Nurse Practitioners) who all work together to provide you with the care you need, when you need it.  Follow up with Dr. Mayford Knife after CPAP titration

## 2020-09-19 NOTE — Telephone Encounter (Addendum)
Patient is scheduled for lab study on 11/25/20. Patient understands his sleep study will be done at [facility] sleep lab. Patient understands he will receive a sleep packet in a week or so. Patient understands to call if he does not receive the sleep packet in a timely manner.  PER DPR LMTCB informed patient to call back to confirm or reschedule.

## 2020-09-19 NOTE — Addendum Note (Signed)
Addended by: Theresia Majors on: 09/19/2020 09:57 AM   Modules accepted: Orders

## 2020-10-04 ENCOUNTER — Ambulatory Visit: Payer: Managed Care, Other (non HMO) | Admitting: Cardiology

## 2020-10-04 ENCOUNTER — Other Ambulatory Visit: Payer: Self-pay

## 2020-10-04 ENCOUNTER — Ambulatory Visit (INDEPENDENT_AMBULATORY_CARE_PROVIDER_SITE_OTHER): Payer: Managed Care, Other (non HMO) | Admitting: Pharmacist

## 2020-10-04 VITALS — BP 124/80 | HR 65

## 2020-10-04 DIAGNOSIS — I10 Essential (primary) hypertension: Secondary | ICD-10-CM

## 2020-10-04 NOTE — Patient Instructions (Addendum)
Continue amlodipine 10mg  daily and valsartan/HCTZ 320/25mg  daily  Try to increase your exercise to 5 days a week  Keep checking blood pressure. Please bring your cuff and readings to your next appointment  Call me at [phone] with any questions

## 2020-10-04 NOTE — Progress Notes (Signed)
Patient ID: Joel Wallace.                 DOB: 1980/11/29                      MRN: [id number]     HPI: Joel Wallace. is a 40 y.o. male referred by Dr. Mayford Wallace to HTN clinic. PMH is significant for ADHD, anxiety, asthma, GERD and HTN. Recently diagnosed with OSA.  He saw Dr. Mayford Wallace on 6/1. His HCTZ was stopped and chlorthalidone was started. However he had a bump in scr and mild hypokalemia therefore it was stopped. Metoprolol succinate 25mg  daily was started.   Patient last seen by PharmD on 6/23. His scr and K returned to baseline off chlorthalidone. Losartan was changed to valsartan/HCTZ 320/25mg  daily. Metoprolol was stopped as pt thought it was contributing to his GERD. Labs were stable after adding valsartan/HCTZ.   Patient presents today to HTN clinic. GERD symptoms improved greatly after stopping metoprolol. Joined the gym and is working out 3-4 times a week. Has an upper arm cuff at home. Does not have a list of reading but says never below 130/80. Denies dizziness, lightheadedness, SOB or swelling. BP in clinic 124/80 then 120/82 on repeat.  Current HTN meds: amlodipine 10mg  daily, valsartan/HCTZ 320/25mg  daily Previously tried: chlorthalidone (bump in scr, hypokalemia) HCTZ (changed to chlorthalidone) BP goal: <130/80  Family History: The patient's family history includes Asthma in his mother; Cancer in his paternal aunt and paternal grandmother; Clotting disorder in his cousin, maternal uncle, and paternal grandmother; Ulcers in his father.    Social History: never smoked  Diet: high protein, no carb ensure Dinner: low carb pasta, wraps  Exercise: works out 3 -4 days a week, got a 7/23- doing cardio and light lifting  Home BP readings: 139/89  Wt Readings from Last 3 Encounters:  09/19/20 (!) 303 lb 12.8 oz (137.8 kg)  09/06/20 300 lb (136.1 kg)  08/15/20 (!) 303 lb 6.4 oz (137.6 kg)   BP Readings from Last 3 Encounters:  09/19/20 124/86  09/06/20  132/90  09/04/20 119/83   Pulse Readings from Last 3 Encounters:  09/19/20 88  09/06/20 63  09/04/20 68    Renal function: CrCl cannot be calculated (Patient's most recent lab result is older than the maximum 21 days allowed.).  Past Medical History:  Diagnosis Date   ADHD (attention deficit hyperactivity disorder)    diagnosed 2012 by psychiatry, [facility]   Allergy    Anxiety    Asthma    DVT (deep venous thrombosis) (HCC)    Gastric ulcer    GERD (gastroesophageal reflux disease)    Hypertension    Low testosterone    Migraine    Wears glasses     Current Outpatient Medications on File Prior to Visit  Medication Sig Dispense Refill   acetaminophen (TYLENOL) 500 MG tablet Take 2 tablets (1,000 mg total) by mouth every 8 (eight) hours as needed for moderate pain. 30 tablet 0   albuterol (PROVENTIL HFA;VENTOLIN HFA) 108 (90 Base) MCG/ACT inhaler Inhale 2 puffs into the lungs every 6 (six) hours as needed for wheezing or shortness of breath.     amLODipine (NORVASC) 10 MG tablet TAKE 1 TABLET(10 MG) BY MOUTH DAILY 90 tablet 3   Blood Pressure Monitoring (10 SERIES BP MONITOR/UPPER ARM) DEVI USE TWICE DAILY 1 each 0   busPIRone (BUSPAR) 5 MG tablet TAKE 1 TABLET(5 MG) BY MOUTH THREE  TIMES DAILY 90 tablet 0   Multiple Vitamins-Minerals (MEGA MULTI MEN) TABS Take 1 tablet by mouth daily.     traZODone (DESYREL) 50 MG tablet TAKE 1 TO 1 AND 1/2 TABLETS AT NIGHT FOR INSOMNIA 45 tablet 3   valsartan-hydrochlorothiazide (DIOVAN HCT) 160-25 MG tablet Take 1 tablet by mouth daily. 90 tablet 3   No current facility-administered medications on file prior to visit.    No Known Allergies  There were no vitals taken for this visit.   Assessment/Plan:  1. Hypertension - Blood pressure is slightly above goal of <130/80 in clinic today. Reports home readings in the 130's/80's at home per pt report. I wonder if his blood pressure cuff is too small. Patient has OSA and is  pending CPAP titration, but not until Sept. Treatment with CPAP should improve him blood pressure. I have asked patient to focus on increasing his exercise. Will follow up in 5 weeks. Continue amlodipine 10mg  daily and valsartan/HCTZ 320/25mg  daily. I have asked him to bring his home BP cuff with him to next appointment so we could check its accuracy.  Thank you  , Pharm.D, BCPS, CPP [facility]  [address], [place], [place]  Phone: [phone]; Fax: [phone]

## 2020-11-13 ENCOUNTER — Ambulatory Visit: Payer: Managed Care, Other (non HMO)

## 2020-11-25 ENCOUNTER — Encounter (HOSPITAL_BASED_OUTPATIENT_CLINIC_OR_DEPARTMENT_OTHER): Payer: Managed Care, Other (non HMO) | Admitting: Cardiology

## 2020-12-12 ENCOUNTER — Ambulatory Visit: Payer: Managed Care, Other (non HMO) | Admitting: Pharmacist

## 2020-12-12 NOTE — Progress Notes (Deleted)
Patient ID: Joel Wallace.                 DOB: 12/15/1980                      MRN: [id number]     HPI: Joel Wallace. is a 40 y.o. male referred by Dr. Mayford Knife to HTN clinic. PMH is significant for HTN, obesity, anxiety, ADHD, asthma, and GERD. Recently diagnosed with OSA. At 6/1 MD visit, his HCTZ was stopped and chlorthalidone was started. However he had a bump in SCr and mild hypokalemia therefore it was stopped. Metoprolol succinate 25mg  daily was started.  Patient seen by PharmD on 6/23. His SCr and K returned to baseline off chlorthalidone. Losartan was changed to valsartan/HCTZ 320/25mg  daily. Metoprolol was stopped as pt thought it was contributing to his GERD. Labs were stable after adding valsartan/HCTZ.   At last visit with PharmD< GERD symptoms had greatly improved after stopping metoprolol. Pt had joined the gym and was working out 3-4 times a week. Clinic BP was better than home readings, pt was advised to bring cuff to next visit. Also pending CPAP titration in mid October.  Recheck bmet today, start spiro if needed Home cuff fit well? Cpap titration soon, should help Diet exercise going well?  Current HTN meds: amlodipine 10mg  daily, valsartan/HCTZ 320/25mg  daily Previously tried: chlorthalidone (bump in SCr, hypokalemia)  BP goal: <130/54mmHg  Family History: The patient's family history includes Asthma in his mother; Cancer in his paternal aunt and paternal grandmother; Clotting disorder in his cousin, maternal uncle, and paternal grandmother; Ulcers in his father.    Social History: never smoked  Diet: high protein, no carb ensure Dinner: low carb pasta, wraps  Exercise: works out 3 -4 days a week, got a - doing cardio and light lifting  Home BP readings: 139/89  Wt Readings from Last 3 Encounters:  09/19/20 (!) 303 lb 12.8 oz (137.8 kg)  09/06/20 300 lb (136.1 kg)  08/15/20 (!) 303 lb 6.4 oz (137.6 kg)   BP Readings from Last 3  Encounters:  10/04/20 124/80  09/19/20 124/86  09/06/20 132/90   Pulse Readings from Last 3 Encounters:  10/04/20 65  09/19/20 88  09/06/20 63    Renal function: CrCl cannot be calculated (Patient's most recent lab result is older than the maximum 21 days allowed.).  Past Medical History:  Diagnosis Date   ADHD (attention deficit hyperactivity disorder)    diagnosed 2012 by psychiatry, [facility]   Allergy    Anxiety    Asthma    DVT (deep venous thrombosis) (HCC)    Gastric ulcer    GERD (gastroesophageal reflux disease)    Hypertension    Low testosterone    Migraine    Wears glasses     Current Outpatient Medications on File Prior to Visit  Medication Sig Dispense Refill   albuterol (PROVENTIL HFA;VENTOLIN HFA) 108 (90 Base) MCG/ACT inhaler Inhale 2 puffs into the lungs every 6 (six) hours as needed for wheezing or shortness of breath.     amLODipine (NORVASC) 10 MG tablet TAKE 1 TABLET(10 MG) BY MOUTH DAILY 90 tablet 3   Blood Pressure Monitoring (10 SERIES BP MONITOR/UPPER ARM) DEVI USE TWICE DAILY 1 each 0   busPIRone (BUSPAR) 5 MG tablet TAKE 1 TABLET(5 MG) BY MOUTH THREE TIMES DAILY 90 tablet 0   Multiple Vitamins-Minerals (MEGA MULTI MEN) TABS Take 1 tablet by mouth daily.  traZODone (DESYREL) 50 MG tablet TAKE 1 TO 1 AND 1/2 TABLETS AT NIGHT FOR INSOMNIA 45 tablet 3   valsartan-hydrochlorothiazide (DIOVAN HCT) 160-25 MG tablet Take 1 tablet by mouth daily. 90 tablet 3   No current facility-administered medications on file prior to visit.    No Known Allergies  There were no vitals taken for this visit.   Assessment/Plan:  1. Hypertension - Blood pressure is slightly above goal of <130/80 in clinic today. Reports home readings in the 130's/80's at home per pt report. I wonder if his blood pressure cuff is too small. Patient has OSA and is pending CPAP titration, but not until Sept. Treatment with CPAP should improve him blood pressure. I have  asked patient to focus on increasing his exercise. Will follow up in 5 weeks. Continue amlodipine 10mg  daily and valsartan/HCTZ 320/25mg  daily. I have asked him to bring his home BP cuff with him to next appointment so we could check its accuracy.  Thank you  , Pharm.D, BCPS, CPP [facility]  [address], [place], [place]  Phone: [phone]; Fax: [phone]

## 2020-12-24 ENCOUNTER — Telehealth: Payer: Self-pay | Admitting: Clinical

## 2020-12-26 ENCOUNTER — Other Ambulatory Visit (INDEPENDENT_AMBULATORY_CARE_PROVIDER_SITE_OTHER): Payer: Self-pay | Admitting: Primary Care

## 2020-12-26 NOTE — Telephone Encounter (Signed)
Sent to PCP ?

## 2020-12-30 ENCOUNTER — Encounter (HOSPITAL_BASED_OUTPATIENT_CLINIC_OR_DEPARTMENT_OTHER): Payer: Managed Care, Other (non HMO) | Admitting: Cardiology

## 2020-12-31 ENCOUNTER — Other Ambulatory Visit (INDEPENDENT_AMBULATORY_CARE_PROVIDER_SITE_OTHER): Payer: Self-pay | Admitting: Primary Care

## 2021-01-29 ENCOUNTER — Other Ambulatory Visit (INDEPENDENT_AMBULATORY_CARE_PROVIDER_SITE_OTHER): Payer: Self-pay | Admitting: Primary Care

## 2021-01-29 NOTE — Telephone Encounter (Signed)
Sent to PCP ?

## 2021-02-01 NOTE — Telephone Encounter (Signed)
Spoke with pt and scheduled appt for 02/21/21 at 11:30am.

## 2021-02-10 ENCOUNTER — Ambulatory Visit (HOSPITAL_BASED_OUTPATIENT_CLINIC_OR_DEPARTMENT_OTHER): Payer: Managed Care, Other (non HMO) | Admitting: Cardiology

## 2021-02-21 ENCOUNTER — Ambulatory Visit (INDEPENDENT_AMBULATORY_CARE_PROVIDER_SITE_OTHER): Payer: Managed Care, Other (non HMO) | Admitting: Clinical

## 2021-02-21 ENCOUNTER — Encounter (INDEPENDENT_AMBULATORY_CARE_PROVIDER_SITE_OTHER): Payer: Self-pay

## 2021-02-21 ENCOUNTER — Other Ambulatory Visit: Payer: Self-pay

## 2021-02-21 DIAGNOSIS — F331 Major depressive disorder, recurrent, moderate: Secondary | ICD-10-CM

## 2021-02-21 DIAGNOSIS — F411 Generalized anxiety disorder: Secondary | ICD-10-CM

## 2021-03-04 ENCOUNTER — Telehealth: Payer: Self-pay | Admitting: Clinical

## 2021-03-04 NOTE — BH Specialist Note (Signed)
Integrated Behavioral Health Initial In-Person Visit  MRN: [id number] Name: Joel Wallace.  Number of Integrated Behavioral Health Clinician visits:: 1/6 Session Start time: 11:30am  Session End time: 12:30pm Total time: 60 minutes  Types of Service: Individual psychotherapy  Interpretor:No. Interpretor Name and Language: N/A   Warm Hand Off Completed.        Subjective: [name]. is a 40 y.o. male accompanied by  self Patient was referred by [name], NP for depression and anxiety. Patient reports the following symptoms/concerns: Reports feeling depressed, decreased interest in activities, trouble sleeping, decreased energy, reports overeating, trouble concentrating, fidgeting, anxiousness, excessive worrying, trouble relaxing, restlessness, and irritability. Reports that he has been experiencing problems with his marriage. Reports learning that his wife cheated on him. Reports that he also learned that his oldest son is not his biologically and his son went to stay with his biological father. Reports that he experiences self-esteem problems and frequent worries. Reports that he has forgiven his wife but still has difficulty with trust.  Reports also worrying about finances. Reports a hx of ADHD and being prescribed Adderall.  Duration of problem: 1+ year; Severity of problem: moderate  Objective: Mood: Anxious and Depressed and Affect: Appropriate Risk of harm to self or others: Suicidal ideation Denies plan/intent  Life Context: Family and Social: Pt is married with four children and one who is an adult.  School/Work: Pt works full time Energy manager. Self-Care: Denies substance use. Pt has been prescribed Buspar with PCP. Pt watches sports as self-care.  Life Changes: Pt has experienced problems in his marriage with infidelity.  Patient and/or Family's Strengths/Protective Factors: Social and Emotional competence and Sense of purpose  Goals  Addressed: Patient will: Reduce symptoms of: anxiety, depression, and insomnia Increase knowledge and/or ability of: coping skills  Demonstrate ability to: Increase healthy adjustment to current life circumstances  Progress towards Goals: Ongoing  Interventions: Interventions utilized: CBT Cognitive Behavioral Therapy, Supportive Counseling, and Psychoeducation and/or Health Education  Standardized Assessments completed: C-SSRS Short, GAD-7, and PHQ 9  Patient and/or Family Response: Pt receptive to tx. Pt receptive to psychoeducation provided on depression and anxiety. Pt receptive to thought reframing to identify negative patterns. Pt receptive to utilizing meditation, creating a night routine to assist with sleep and deep breathing exercises.  Patient Centered Plan: Patient is on the following Treatment Plan(s):  Depression and anxiety  Assessment: Endorses passive SI. Denies plan/intent. Endorses protective factors as children. Endorses a hx of suicide attempt during childhood by taking pills. Verbal safety plan completed and pt provided with crisis resources. Pt acknowledged proper precaution to take if SI arises with plan, means, and intent. Denies HI. Denies auditory/visual hallucinations. Patient currently experiencing depression and anxiety related to current marriage and finances. Pt appears to experience excessive worrying and racing thoughts that contribute to pt's difficulty sleeping. Pt is having difficulty with completing moving past spouse's infidelity.    Patient may benefit from couple's therapy and psychiatry due to hx of ADHD. LCSWA provided psychoeducation on depression and anxiety. LCSWA utilized thought reframing to identify negative patterns. LCSWA encouraged pt to utilize deep breathing exercises, meditation, and to establish a night routine to assist with sleep. LCSWA provided pt with couple's therapy resources and will have pt referred for psychiatry. LCSWA will fu  with pt.  Plan: Follow up with behavioral health clinician on : 03/28/21 Behavioral recommendations: Utilize deep breathing exercises, meditation, and establish a night routine. Utilize provided crisis resources if SI arises  with plan, means, and intent. Referral(s): Integrated Art gallery manager (In Clinic), Psychiatrist, and Counselor "From scale of 1-10, how likely are you to follow plan?": 10  Claudean Leavelle C Jaramie Bastos, LCSW

## 2021-03-04 NOTE — Telephone Encounter (Signed)
Pt has been referred for [facility] for psychiatry and [facility] for outpatient therapy.

## 2021-03-22 ENCOUNTER — Ambulatory Visit (HOSPITAL_BASED_OUTPATIENT_CLINIC_OR_DEPARTMENT_OTHER): Payer: Managed Care, Other (non HMO) | Attending: Cardiology | Admitting: Cardiology

## 2021-03-22 ENCOUNTER — Other Ambulatory Visit: Payer: Self-pay

## 2021-03-22 DIAGNOSIS — G4733 Obstructive sleep apnea (adult) (pediatric): Secondary | ICD-10-CM | POA: Diagnosis not present

## 2021-03-24 NOTE — Procedures (Signed)
° ° °  Patient Name: Joel Wallace, Joel Wallace Date: 03/22/2021 Gender: Male D.O.B: [birth year] Age (years): 68 Referring Provider: Armanda Magic MD, ABSM Height (inches): 70 Interpreting Physician: Armanda Magic MD, ABSM Weight (lbs): 296 RPSGT: Rolene Arbour BMI: 42 MRN: [id number] Neck Size: 19.00  CLINICAL INFORMATION The patient is referred for a CPAP titration to treat sleep apnea.  SLEEP STUDY TECHNIQUE As per the AASM Manual for the Scoring of Sleep and Associated Events v2.3 (April 2016) with a hypopnea requiring 4% desaturations.  The channels recorded and monitored were frontal, central and occipital EEG, electrooculogram (EOG), submentalis EMG (chin), nasal and oral airflow, thoracic and abdominal wall motion, anterior tibialis EMG, snore microphone, electrocardiogram, and pulse oximetry. Continuous positive airway pressure (CPAP) was initiated at the beginning of the study and titrated to treat sleep-disordered breathing.  MEDICATIONS Medications self-administered by patient taken the night of the study : N/A  TECHNICIAN COMMENTS Comments added by technician: none Comments added by scorer: N/A  RESPIRATORY PARAMETERS Optimal PAP Pressure (cm):11  AHI at Optimal Pressure (/hr):0 Overall Minimal O2 (%):88.0  Supine % at Optimal Pressure (%):81 Minimal O2 at Optimal Pressure (%): 92.0   SLEEP ARCHITECTURE The study was initiated at 10:37:17 PM and ended at 4:44:59 AM.  Sleep onset time was 5.4 minutes and the sleep efficiency was 87.8%. The total sleep time was 322.8 minutes.  The patient spent 1.4% of the night in stage N1 sleep, 78.8% in stage N2 sleep, 1.1% in stage N3 and 18.7% in REM.Stage REM latency was 83.5 minutes  Wake after sleep onset was 39.5. Alpha intrusion was absent. Supine sleep was 88.85%.  CARDIAC DATA The 2 lead EKG demonstrated sinus rhythm. The mean heart rate was 79.8 beats per minute. Other EKG findings include: PACs.  LEG MOVEMENT  DATA The total Periodic Limb Movements of Sleep (PLMS) were 0. The PLMS index was 0.0. A PLMS index of <15 is considered normal in adults.  IMPRESSIONS - The optimal PAP pressure was 11 cm of water. - Mild oxygen desaturations were observed during this titration (min O2 = 88.0%). - The patient snored with soft snoring volume during this titration study. - PACs were observed during this study. - Clinically significant periodic limb movements were not noted during this study. Arousals associated with PLMs were rare.  DIAGNOSIS - Obstructive Sleep Apnea (G47.33)  RECOMMENDATIONS - Trial of CPAP therapy on 11 cm H2O with a Large size Fisher&Paykel Full Face Mask Simplus mask and heated humidification. - Avoid alcohol, sedatives and other CNS depressants that may worsen sleep apnea and disrupt normal sleep architecture. - Sleep hygiene should be reviewed to assess factors that may improve sleep quality. - Weight management and regular exercise should be initiated or continued. - Return to Sleep Center for re-evaluation after 6 weeks of therapy  [Electronically signed] 03/24/2021 09:25 PM  Armanda Magic MD, ABSM Diplomate, American Board of Sleep Medicine

## 2021-03-28 ENCOUNTER — Ambulatory Visit (INDEPENDENT_AMBULATORY_CARE_PROVIDER_SITE_OTHER): Payer: Managed Care, Other (non HMO) | Admitting: Clinical

## 2021-03-28 ENCOUNTER — Other Ambulatory Visit: Payer: Self-pay

## 2021-03-28 DIAGNOSIS — F411 Generalized anxiety disorder: Secondary | ICD-10-CM

## 2021-03-28 DIAGNOSIS — F331 Major depressive disorder, recurrent, moderate: Secondary | ICD-10-CM | POA: Diagnosis not present

## 2021-03-29 ENCOUNTER — Ambulatory Visit (INDEPENDENT_AMBULATORY_CARE_PROVIDER_SITE_OTHER): Payer: Managed Care, Other (non HMO) | Admitting: Primary Care

## 2021-04-04 ENCOUNTER — Encounter: Payer: Self-pay | Admitting: Cardiology

## 2021-04-05 ENCOUNTER — Telehealth: Payer: Self-pay | Admitting: *Deleted

## 2021-04-05 NOTE — Telephone Encounter (Signed)
-----   Message from Quintella Reichert, MD sent at 03/24/2021  9:29 PM EST ----- Please let patient know that they had a successful PAP titration and let DME know that orders are in EPIC.  Please set up 6 week OV with me.

## 2021-04-05 NOTE — Telephone Encounter (Signed)
The patient has been notified of the result and verbalized understanding.  All questions (if any) were answered. Latrelle Dodrill, CMA 04/05/2021 10:06 AM    Upon patient request DME selection is [facility] Patient understands he will be contacted by [facility] to set up his cpap. Patient understands to call if [facility] does not contact him with new setup in a timely manner. Patient understands they will be called once confirmation has been received from [facility]/ that they have received their new machine to schedule 10 week follow up appointment.   [facility] notified of new cpap order  Please add to airview Patient was grateful for the call and thanked me

## 2021-04-05 NOTE — BH Specialist Note (Signed)
Integrated Behavioral Health Follow Up In-Person Visit  MRN: [id number] Name: Joel Wallace.  Number of Integrated Behavioral Health Clinician visits: 2/6 Session Start time: 4:00pm  Session End time: 4:45pm Total time: 45  minutes  Types of Service: Individual psychotherapy  Interpretor:No. Interpretor Name and Language: N/A  Subjective: Joel Wallace. is a 41 y.o. male accompanied by  self Patient was referred by [name], NP for depression and anxiety. Patient reports the following symptoms/concerns: Reports feeling depressed at times, anxious, and excessive worrying. Reports continued problems in his marriage. Reports difficulty with trusting his wife. Reports that he frequently over analyzes which impacts his sleep.  Duration of problem: 1+ year; Severity of problem: moderate  Objective: Mood: Anxious and Affect: Appropriate Risk of harm to self or others: No plan to harm self or others Endorses wishing he wasn't here at times but denies SI.   Life Context: Family and Social: Pt is married with four children and one who is an adult.  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 Self-Care: Denies substance use. Pt has been prescribed Buspar with PCP. Pt watches sports as self-care.  Life Changes: Pt has experienced problems in his marriage with infidelity. (No changes to life context)  Patient and/or Family's Strengths/Protective Factors: Social and Emotional competence and Sense of purpose  Goals Addressed: Patient will:  Reduce symptoms of: anxiety, depression, and insomnia   Increase knowledge and/or ability of: coping skills   Demonstrate ability to: Increase healthy adjustment to current life circumstances  Progress towards Goals: Ongoing  Interventions: Interventions utilized:  Mindfulness or Management consultant, CBT Cognitive Behavioral Therapy, and Supportive Counseling Standardized Assessments completed: C-SSRS  Short Flowsheet Row Integrated Behavioral Health from 03/28/2021 in [facility] Integrated Behavioral Health from 02/21/2021 in [facility] ED from 06/19/2020 in [facility]  C-SSRS RISK CATEGORY Low Risk Low Risk No Risk       Patient and/or Family Response: Pt receptive to tx. Pt receptive to psychoeducation provided on depression and anxiety. Pt receptive to assistance with cognitive processing skills. Pt receptive to cognitive restructuring to decrease negative and unhelpful thoughts. Pt receptive to meditation and deep breathing. Pt will consider marriage counseling.   Patient Centered Plan: Patient is on the following Treatment Plan(s): Depression and anxiety  Assessment: Endorses thoughts of being better off dead at times however denies SI/HI. Denies auditory/visual hallucinations. Patient currently experiencing depression and anxiety. Pt experiencing problems in his relationship. Pt continues to experience excessive worrying with finances and his marriage. Pt has difficulty with trusting his wife.  Patient may benefit from marriage counseling. Pt has scheduled an appt with [facility] for psychiatry and individual therapy. LCSWA provided psychoeducation on depression and anxiety. LCSWA assisted pt with cognitive processing skills and utilized cognitive restructuring. LCSWA encouraged pt to utilize meditation and deep breathing exercises.  Plan: Follow up with behavioral health clinician on : 04/18/21 Behavioral recommendations: Utilize meditation and deep breathing. Referral(s): Integrated [employer] (In Clinic) "From scale of 1-10, how likely are you to follow plan?": 10  Teniqua Marron C Lessie Manigo, LCSW

## 2021-04-10 ENCOUNTER — Ambulatory Visit (INDEPENDENT_AMBULATORY_CARE_PROVIDER_SITE_OTHER): Payer: Managed Care, Other (non HMO) | Admitting: Primary Care

## 2021-04-10 ENCOUNTER — Encounter (INDEPENDENT_AMBULATORY_CARE_PROVIDER_SITE_OTHER): Payer: Self-pay | Admitting: Primary Care

## 2021-04-10 ENCOUNTER — Other Ambulatory Visit: Payer: Self-pay

## 2021-04-10 VITALS — BP 123/83 | HR 81 | Temp 98.3°F | Ht 69.0 in | Wt 307.0 lb

## 2021-04-10 DIAGNOSIS — F411 Generalized anxiety disorder: Secondary | ICD-10-CM | POA: Diagnosis not present

## 2021-04-10 MED ORDER — BUSPIRONE HCL 7.5 MG PO TABS: 7.5000 mg | ORAL_TABLET | Freq: Three times a day (TID) | ORAL | 0 refills | Status: DC

## 2021-04-10 NOTE — Progress Notes (Signed)
Does not feel like medication is helping with anxiety/depression

## 2021-04-10 NOTE — Progress Notes (Signed)
[facility]  Joel Wallace, is a 41 y.o. male  FGH:[id number]  CVE:[id number]  DOB - 1980-07-06  Chief Complaint  Patient presents with   Anxiety   Depression   Erectile Dysfunction       Subjective:   Joel Wallace is a 41 y.o. male here today for a follow up visit. Patient has No headache, No chest pain, No abdominal pain - No Nausea, No new weakness tingling or numbness, No Cough - SOB.  No problems updated.  ALLERGIES: No Known Allergies  PAST MEDICAL HISTORY: Past Medical History:  Diagnosis Date   ADHD (attention deficit hyperactivity disorder)    diagnosed 2012 by psychiatry, [facility]   Allergy    Anxiety    Asthma    DVT (deep venous thrombosis) (HCC)    Gastric ulcer    GERD (gastroesophageal reflux disease)    Hypertension    Low testosterone    Migraine    Wears glasses     MEDICATIONS AT HOME: Prior to Admission medications   Medication Sig Start Date End Date Taking? Authorizing Provider  albuterol (PROVENTIL HFA;VENTOLIN HFA) 108 (90 Base) MCG/ACT inhaler Inhale 2 puffs into the lungs every 6 (six) hours as needed for wheezing or shortness of breath.   Yes [provider]  amLODipine (NORVASC) 10 MG tablet TAKE 1 TABLET(10 MG) BY MOUTH DAILY 09/19/20  Yes Turner, Cornelious Bryant, MD  Blood Pressure Monitoring (10 SERIES BP MONITOR/UPPER ARM) DEVI USE TWICE DAILY 04/24/20  Yes [name] P, NP  busPIRone (BUSPAR) 5 MG tablet TAKE 1 TABLET(5 MG) BY MOUTH THREE TIMES DAILY 01/29/21  Yes Grayce Sessions, NP  Multiple Vitamins-Minerals (MEGA MULTI MEN) TABS Take 1 tablet by mouth daily.   Yes [provider]  traZODone (DESYREL) 50 MG tablet TAKE 1 TO 1 AND 1/2 TABLETS AT NIGHT FOR INSOMNIA 07/27/20  Yes Grayce Sessions, NP  valsartan-hydrochlorothiazide (DIOVAN HCT) 160-25 MG tablet Take 1 tablet by mouth daily. 09/19/20  Yes Quintella Reichert, MD    Objective:   Vitals:   04/10/21 1603  BP:  123/83  Pulse: 81  Temp: 98.3 F (36.8 C)  TempSrc: Oral  SpO2: 94%  Weight: (!) 307 lb (139.3 kg)  Height: 5\' 9"  (1.753 m)   Exam General appearance : morbid obese, Awake, alert, not in any distress. Speech Clear. Not toxic looking HEENT: Atraumatic and Normocephalic, pupils equally reactive to light and accomodation Neck: Supple, no JVD. No cervical lymphadenopathy.  Chest: Good air entry bilaterally, no added sounds  CVS: S1 S2 regular, no murmurs.  Abdomen: Bowel sounds present, Non tender and not distended with no gaurding, rigidity or rebound. Extremities: B/L Lower Ext shows no edema, both legs are warm to touch Neurology: Awake alert, and oriented X 3, CN II-XII intact, Non focal Skin: No Rash  Data Review No results found for: HGBA1C  Assessment & Plan  Joel Wallace was seen today for anxiety, depression and erectile dysfunction.  Diagnoses and all orders for this visit:  Generalized anxiety disorder -     busPIRone (BUSPAR) 7.5 MG tablet; Take 1 tablet (7.5 mg total) by mouth 3 (three) times daily. Flowsheet Row Office Visit from 04/10/2021 in [facility]  PHQ-9 Total Score 19         There are no diagnoses linked to this encounter.   Patient have been counseled extensively about nutrition and exercise. Other issues discussed during this visit include: low cholesterol diet,  weight control and daily exercise, foot care, annual eye examinations at Ophthalmology, importance of adherence with medications and regular follow-up. We also discussed long term complications of uncontrolled diabetes and hypertension.   Return in about 2 months (around 06/08/2021) for f/u on anxiety .  The patient was given clear instructions to go to ER or return to medical center if symptoms don't improve, worsen or new problems develop. The patient verbalized understanding. The patient was told to call to get lab results if they haven't heard anything in the next week.    This note has been created with Education officer, environmental. Any transcriptional errors are unintentional.   Grayce Sessions, NP 04/14/2021, 6:09 PM

## 2021-04-10 NOTE — Patient Instructions (Signed)
Buspirone Tablets ?What is this medication? ?BUSPIRONE (byoo SPYE rone) treats anxiety. It works by balancing the levels of dopamine and serotonin in your brain, hormones that help regulate mood. ?This medicine may be used for other purposes; ask your health care provider or pharmacist if you have questions. ?COMMON BRAND NAME(S): BuSpar, Buspar Dividose ?What should I tell my care team before I take this medication? ?They need to know if you have any of these conditions: ?Kidney or liver disease ?An unusual or allergic reaction to buspirone, other medications, foods, dyes, or preservatives ?Pregnant or trying to get pregnant ?Breast-feeding ?How should I use this medication? ?Take this medication by mouth with a glass of water. Follow the directions on the prescription label. You may take this medication with or without food. To ensure that this medication always works the same way for you, you should take it either always with or always without food. Take your doses at regular intervals. Do not take your medication more often than directed. Do not stop taking except on the advice of your care team. ?Talk to your care team about the use of this medication in children. Special care may be needed. ?Overdosage: If you think you have taken too much of this medicine contact a poison control center or emergency room at once. ?NOTE: This medicine is only for you. Do not share this medicine with others. ?What if I miss a dose? ?If you miss a dose, take it as soon as you can. If it is almost time for your next dose, take only that dose. Do not take double or extra doses. ?What may interact with this medication? ?Do not take this medication with any of the following: ?Linezolid ?MAOIs like Carbex, Eldepryl, Marplan, Nardil, and Parnate ?Methylene blue ?Procarbazine ?This medication may also interact with the following: ?Diazepam ?Digoxin ?Diltiazem ?Erythromycin ?Grapefruit juice ?Haloperidol ?Medications for mental  depression or mood problems ?Medications for seizures like carbamazepine, phenobarbital and phenytoin ?Nefazodone ?Other medications for anxiety ?Rifampin ?Ritonavir ?Some antifungal medications like itraconazole, ketoconazole, and voriconazole ?Verapamil ?Warfarin ?This list may not describe all possible interactions. Give your health care provider a list of all the medicines, herbs, non-prescription drugs, or dietary supplements you use. Also tell them if you smoke, drink alcohol, or use illegal drugs. Some items may interact with your medicine. ?What should I watch for while using this medication? ?Visit your care team for regular checks on your progress. It may take 1 to 2 weeks before your anxiety gets better. ?You may get drowsy or dizzy. Do not drive, use machinery, or do anything that needs mental alertness until you know how this medication affects you. Do not stand or sit up quickly, especially if you are an older patient. This reduces the risk of dizzy or fainting spells. Alcohol can make you more drowsy and dizzy. Avoid alcoholic drinks. ?What side effects may I notice from receiving this medication? ?Side effects that you should report to your care team as soon as possible: ?Allergic reactions--skin rash, itching, hives, swelling of the face, lips, tongue, or throat ?Irritability, confusion, fast or irregular heartbeat, muscle stiffness, twitching muscles, sweating, high fever, seizure, chills, vomiting, diarrhea, which may be signs of serotonin syndrome ?Side effects that usually do not require medical attention (report to your care team if they continue or are bothersome): ?Anxiety or nervousness ?Dizziness ?Drowsiness ?Headache ?Nausea ?Trouble sleeping ?This list may not describe all possible side effects. Call your doctor for medical advice about side effects. You may report   side effects to FDA at 1-800-FDA-1088. ?Where should I keep my medication? ?Keep out of the reach of children. ?Store at room  temperature below 30 degrees C (86 degrees F). Protect from light. Keep container tightly closed. Throw away any unused medication after the expiration date. ?NOTE: This sheet is a summary. It may not cover all possible information. If you have questions about this medicine, talk to your doctor, pharmacist, or health care provider. ?? 2022 Elsevier/Gold Standard (2020-05-31 00:00:00) ? ?

## 2021-04-15 DIAGNOSIS — F909 Attention-deficit hyperactivity disorder, unspecified type: Secondary | ICD-10-CM | POA: Insufficient documentation

## 2021-04-16 DIAGNOSIS — F329 Major depressive disorder, single episode, unspecified: Secondary | ICD-10-CM | POA: Insufficient documentation

## 2021-04-18 ENCOUNTER — Ambulatory Visit (INDEPENDENT_AMBULATORY_CARE_PROVIDER_SITE_OTHER): Payer: Managed Care, Other (non HMO) | Admitting: Clinical

## 2021-05-02 ENCOUNTER — Other Ambulatory Visit: Payer: Self-pay

## 2021-05-02 ENCOUNTER — Ambulatory Visit (INDEPENDENT_AMBULATORY_CARE_PROVIDER_SITE_OTHER): Payer: Managed Care, Other (non HMO) | Admitting: Clinical

## 2021-05-02 DIAGNOSIS — F411 Generalized anxiety disorder: Secondary | ICD-10-CM | POA: Diagnosis not present

## 2021-05-02 DIAGNOSIS — F331 Major depressive disorder, recurrent, moderate: Secondary | ICD-10-CM

## 2021-05-02 NOTE — BH Specialist Note (Signed)
Integrated Behavioral Health Follow Up In-Person Visit  MRN: [id number] Name: Joel Wallace.  Number of [facility] Clinician visits: 3- Third Visit  Session Start time: 1500   Session End time: [id number]  Total time in minutes: 40   Types of Service: Individual psychotherapy  Interpretor:No. Interpretor Name and Language: N/A  Subjective: [name]. is a 41 y.o. male accompanied by  self Patient was referred by Juluis Mire, NP for anxiety and depression. Patient reports the following symptoms/concerns: Reports feeling anxious and worried. Reports trouble staying asleep. Reports that he has been concerned about his oldest son who moved back home recently. Reports concerns that his son is not working or going to school. Reports that he is worried about his son being depressed. Pt also reports that he has been trying to be peaceful lately and not argue with family.  Duration of problem: 1+ year; Severity of problem: moderate  Objective: Mood: Anxious and Affect: Appropriate Risk of harm to self or others: No plan to harm self or others  Life Context: Family and Social: Pt is married with four children and one who is an adult.  School/Work: Pt works full time Physicist, medical and basketball. Self-Care: Denies substance use. Pt attends [employer] and is prescribed concerta and sertraline. Pt utilizes meditation. Pt watches sports as self-care.  Life Changes: Pt has experienced problems in his marriage with infidelity.  Patient and/or Family's Strengths/Protective Factors: Social and Emotional competence and Sense of purpose  Goals Addressed: Patient will:  Reduce symptoms of: anxiety, depression, and insomnia   Increase knowledge and/or ability of: coping skills   Demonstrate ability to: Increase healthy adjustment to current life circumstances  Progress towards Goals: Ongoing  Interventions: Interventions utilized:   Mindfulness or Psychologist, educational, CBT Cognitive Behavioral Therapy, and Supportive Counseling Standardized Assessments completed: Not Needed  Patient and/or Family Response: Pt receptive to tx. Pt receptive to psychoeducation provided on depression and anxiety. Pt receptive to cognitive restructuring. Pt receptive to problem solving to assist pt with his son. Pt receptive to continuing deep breathing, meditation, and adhering to medication.  Patient Centered Plan: Patient is on the following Treatment Plan(s): Depression and anxiety  Assessment: Denies SI/HI. Patient currently experiencing anxiety and depression. Pt's mood appears to be improved. Pt is experiencing problems at home. Pt has established with [facility] for psychiatry however is requesting an additional referral for outpatient therapy.   Patient may benefit from outpatient therapy. LCSW will refer pt. LCSW provided psychoeducation on depression. LCSW utilized cognitive restructuring to decrease negative thoughts. LCSW assisted pt with problem solving. LCSW encouraged pt to establish healthy boundaries with his son. LCSW encouraged pt to continue adhering to medication and utilize meditation and deep breathing.  Plan: Follow up with behavioral health clinician on : 05/23/21 Behavioral recommendations: Utilize deep breathing and meditation and continue adhering to medication. Establish healthy boundaries with son.  Referral(s): [facility] (In Clinic) and Counselor "From scale of 1-10, how likely are you to follow plan?": 10  Gaje Tennyson C Farren Landa, LCSW

## 2021-05-22 ENCOUNTER — Telehealth (INDEPENDENT_AMBULATORY_CARE_PROVIDER_SITE_OTHER): Payer: Self-pay

## 2021-05-22 NOTE — Telephone Encounter (Signed)
Copied from CRM [id number]. Topic: Appointment Scheduling - Scheduling Inquiry for Clinic ?>> May 22, 2021  4:06 PM Joel Wallace wrote: ?Reason for CRM: The patient would like to be seen by [facility] when possible  ? ?The patient has had to cancel their appt for 05/23/21 ? ?Please contact further to schedule ?

## 2021-05-23 ENCOUNTER — Ambulatory Visit (INDEPENDENT_AMBULATORY_CARE_PROVIDER_SITE_OTHER): Payer: Managed Care, Other (non HMO) | Admitting: Clinical

## 2021-05-23 NOTE — Telephone Encounter (Signed)
I attempted to call pt, no answer, left vm.

## 2021-06-11 ENCOUNTER — Ambulatory Visit (INDEPENDENT_AMBULATORY_CARE_PROVIDER_SITE_OTHER): Payer: Managed Care, Other (non HMO) | Admitting: Primary Care

## 2021-07-16 ENCOUNTER — Encounter: Payer: Self-pay | Admitting: Cardiology

## 2021-07-16 ENCOUNTER — Telehealth (INDEPENDENT_AMBULATORY_CARE_PROVIDER_SITE_OTHER): Payer: Managed Care, Other (non HMO) | Admitting: Cardiology

## 2021-07-16 VITALS — Ht 69.0 in | Wt 298.0 lb

## 2021-07-16 DIAGNOSIS — I10 Essential (primary) hypertension: Secondary | ICD-10-CM

## 2021-07-16 DIAGNOSIS — G4733 Obstructive sleep apnea (adult) (pediatric): Secondary | ICD-10-CM

## 2021-07-16 NOTE — Patient Instructions (Signed)
Medication Instructions:  ?Your physician recommends that you continue on your current medications as directed. Please refer to the Current Medication list given to you today. ? ?*If you need a refill on your cardiac medications before your next appointment, please call your pharmacy* ? ? ?Lab Work: ?BMET ?If you have labs (blood work) drawn today and your tests are completely normal, you will receive your results only by: ?MyChart Message (if you have MyChart) OR ?A paper copy in the mail ?If you have any lab test that is abnormal or we need to change your treatment, we will call you to review the results. ? ?Follow-Up: ?At [facility], you and your health needs are our priority.  As part of our continuing mission to provide you with exceptional heart care, we have created designated Provider Care Teams.  These Care Teams include your primary Cardiologist (physician) and Advanced Practice Providers (APPs -  Physician Assistants and Nurse Practitioners) who all work together to provide you with the care you need, when you need it. ? ?Your next appointment:   ?1 year(s) ? ?The format for your next appointment:   ?In Person ? ?Provider:   ?Armanda Magic, MD   ? ? ?Important Information About Sugar ? ? ? ? ?  ?

## 2021-07-16 NOTE — Addendum Note (Signed)
Addended by: Theresia Majors on: 07/16/2021 08:30 AM ? ? Modules accepted: Orders ? ?

## 2021-07-16 NOTE — Progress Notes (Signed)
?  ? ?Virtual Visit via Video Note  ? ?This visit type was conducted due to national recommendations for restrictions regarding the COVID-19 Pandemic (e.g. social distancing) in an effort to limit this patient's exposure and mitigate transmission in our community.  Due to his co-morbid illnesses, this patient is at least at moderate risk for complications without adequate follow up.  This format is felt to be most appropriate for this patient at this time.  All issues noted in this document were discussed and addressed.  A limited physical exam was performed with this format.  Please refer to the patient's chart for his consent to telehealth for [facility].   ? ? ?Date:  07/16/2021  ? ?ID:  Lianne Cureandy Tolleson Jr., DOB Apr 02, 1980, MRN [id number] ? ?PCP:  Grayce SessionsEdwards, Michelle P, NP  ?Cardiologist:  Armanda Magicraci Kiyanna Biegler, MD  ? ?Chief Complaint  ?Patient presents with  ? Sleep Apnea  ? Hypertension  ? ? ?History of Present Illness:  ?Lianne CureRandy Elling Jr. is a 41 y.o. male  with a hx of ADHD, anxiety, asthma, GERD and HTN.  He was initially referred for help with treatment of his HTN and evaluation of chest pain. He was seen in the ER on 06/19/2020 for chest pain that was midsternal with radiation to the left chest and left arm and was described as sharp associated with diaphoresis, dizziness and SOB.  hsTrop was normal x 2 and EKG was nonischemic.  He days that the CP started while he was in bed and continued through the next day.  Cxray was normal.  He has a hx of GERD and gastric ulcer and was treated with a GI cocktail but it did not help and referred to Cardiology for further evaluation.  ? ?He underwent coronary CTA showing a coronary Ca score of 0 and normal coronary arteries with no anomalous coronary arteries. Renal duplex showed no RAS.  Renin/Aldo studies were normal.  He was seen in HTN clinic and BB was stopped due to GERD sx.  His SCr bumped on Chlorthalidone but had been stable on HCTZ before.  His Losartan was stopped and  Valsartan HCT was added at 320/25mg  daily.  He was continued on Amlodipine 10mg  daily.   ? ?He was referred for sleep study which revealed severe obstructive sleep apnea with an AHI of 32.1/h and no central events.  He underwent CPAP titration on 03/22/2018 23 to 11 cm H2O.  He is now here for follow-up. ? ?He is doing well with his CPAP device and thinks that he has gotten used to it.  He tolerates the mask and feels the pressure is adequate.  Since going on CPAP he feels rested in the am and has no significant daytime sleepiness.  He denies any significant mouth or nasal dryness or nasal congestion.  He does not think that he snores.    ? ?Past Medical History:  ?Diagnosis Date  ? ADHD (attention deficit hyperactivity disorder)   ? diagnosed 2012 [facility], [facility]  ? Allergy   ? Anxiety   ? Asthma   ? DVT (deep venous thrombosis) (HCC)   ? Gastric ulcer   ? GERD (gastroesophageal reflux disease)   ? Hypertension   ? Low testosterone   ? Migraine   ? Wears glasses   ? ? ?Past Surgical History:  ?Procedure Laterality Date  ? COLONOSCOPY  2007  ? ESOPHAGOGASTRODUODENOSCOPY  2007  ? ? ?Current Medications: ?Current Meds  ?Medication Sig  ? albuterol (PROVENTIL HFA;VENTOLIN  HFA) 108 (90 Base) MCG/ACT inhaler Inhale 2 puffs into the lungs every 6 (six) hours as needed for wheezing or shortness of breath.  ? amLODipine (NORVASC) 10 MG tablet TAKE 1 TABLET(10 MG) BY MOUTH DAILY  ? Blood Pressure Monitoring (10 SERIES BP MONITOR/UPPER ARM) DEVI USE TWICE DAILY  ? methylphenidate 36 MG PO CR tablet Take 36 mg by mouth every morning.  ? Multiple Vitamins-Minerals (MEGA MULTI MEN) TABS Take 1 tablet by mouth daily.  ? sertraline (ZOLOFT) 50 MG tablet Take 50 mg by mouth daily.  ? traZODone (DESYREL) 50 MG tablet TAKE 1 TO 1 AND 1/2 TABLETS AT NIGHT FOR INSOMNIA (Patient taking differently: at bedtime as needed.)  ? valsartan-hydrochlorothiazide (DIOVAN HCT) 160-25 MG tablet Take 1 tablet by mouth daily.   ? ? ?Allergies:   Patient has no known allergies.  ? ?Social History  ? ?Socioeconomic History  ? Marital status: Married  ?  Spouse name: Not on file  ? Number of children: Not on file  ? Years of education: Not on file  ? Highest education level: Not on file  ?Occupational History  ? Not on file  ?Tobacco Use  ? Smoking status: Never  ? Smokeless tobacco: Never  ?Vaping Use  ? Vaping Use: Never used  ?Substance and Sexual Activity  ? Alcohol use: Not Currently  ?  Comment: 2 twice a month   ? Drug use: No  ? Sexual activity: Yes  ?  Birth control/protection: None  ?  Comment: Married 15 years  ?Other Topics Concern  ? Not on file  ?Social History Narrative  ? Married, has 4 sons  ? ?Social Determinants of Health  ? ?Financial Resource Strain: Not on file  ?Food Insecurity: Not on file  ?Transportation Needs: Not on file  ?Physical Activity: Not on file  ?Stress: Not on file  ?Social Connections: Not on file  ?  ? ?Family History:  The patient's family history includes Asthma in his mother; Cancer in his paternal aunt and paternal grandmother; Clotting disorder in his cousin, maternal uncle, and paternal grandmother; Ulcers in his father.  ? ?ROS:   ?Please see the history of present illness.    ?ROS All other systems reviewed and are negative. ? ? ? ? ?PHYSICAL EXAM:   ?VS:  Ht 5\' 9"  (1.753 m)   Wt 298 lb (135.2 kg)   BMI 44.01 kg/m?    ?GEN: Well nourished, well developed in no acute distress ?HEENT: Normal ?NECK: No JVD; No carotid bruits ?LYMPHATICS: No lymphadenopathy ?CARDIAC:RRR, no murmurs, rubs, gallops ?RESPIRATORY:  Clear to auscultation without rales, wheezing or rhonchi  ?ABDOMEN: Soft, non-tender, non-distended ?MUSCULOSKELETAL:  No edema; No deformity  ?SKIN: Warm and dry ?NEUROLOGIC:  Alert and oriented x 3 ?PSYCHIATRIC:  Normal affect   ?Wt Readings from Last 3 Encounters:  ?07/16/21 298 lb (135.2 kg)  ?04/10/21 (!) 307 lb (139.3 kg)  ?03/22/21 296 lb (134.3 kg)  ?  ? ? ?Studies/Labs Reviewed:   ? ?EKG:  EKG is not ordered today.  ? ?Recent Labs: ?08/15/2020: TSH 2.320 ?09/12/2020: BUN 19; Creatinine, Ser 1.04; Potassium 4.6; Sodium 142  ? ?Lipid Panel ?   ?Component Value Date/Time  ? CHOL 192 12/01/2018 1614  ? TRIG 57 12/01/2018 1614  ? HDL 62 12/01/2018 1614  ? CHOLHDL 3.1 12/01/2018 1614  ? LDLCALC 119 (H) 12/01/2018 1614  ? ? ?Additional studies/ records that were reviewed today include:  ?OV notes from ER and EKG from 06/19/2020  from ER, renal duplex, labs, coronary CTA ? ? ? ?ASSESSMENT:   ? ?1. OSA (obstructive sleep apnea)   ?2. Essential hypertension   ? ? ? ? ?PLAN:  ?In order of problems listed above: ? ?OSA - The patient is tolerating PAP therapy well without any problems. The PAP download performed by his DME was personally reviewed and interpreted by me today and showed an AHI of 0.1 /hr on 11 cm H2O with 93% compliance in using more than 4 hours nightly.  The patient has been using and benefiting from PAP use and will continue to benefit from therapy.  ?  ?Hypertension ?-BP has been 120/72mmHg ?-renal function bumped after changing HCTZ to Chlorthalidone ?-stopped Lopressor due to GERD and placed on Valsartan HCT 320/25mg  daily ?-Aldo/Renin/PRA/TSH normal ?-renal duplex with no RAS ?-home sleep study showed severe OSA and now on CPAP ?-Continue prescription drug management with valsartan HCT 320-25 mg daily and amlodipine 10 mg daily with as needed refills ?-Check bmet ? ?Followup with me in 1 year ? ?Medication Adjustments/Labs and Tests Ordered: ?Current medicines are reviewed at length with the patient today.  Concerns regarding medicines are outlined above.  Medication changes, Labs and Tests ordered today are listed in the Patient Instructions below. ? ?There are no Patient Instructions on file for this visit. ? ? ?Signed, ?Armanda Magic, MD  ?07/16/2021 8:16 AM    ?[facility] ?[address] [place], [place], [place]  [address] ?Phone: [phone]; Fax: [phone]  ? ?

## 2021-07-18 ENCOUNTER — Telehealth: Payer: Self-pay | Admitting: *Deleted

## 2021-07-18 DIAGNOSIS — G4733 Obstructive sleep apnea (adult) (pediatric): Secondary | ICD-10-CM

## 2021-07-18 NOTE — Telephone Encounter (Signed)
-----   Message from Theresia Majors, RN sent at 07/16/2021  9:14 AM EDT ----- ?Per Dr. Mayford Knife: ?Please order the patient new CPAP supplies.  ?Thanks! ? ?

## 2021-07-18 NOTE — Telephone Encounter (Signed)
Order place to [facility] via community message. ?

## 2021-09-21 ENCOUNTER — Other Ambulatory Visit: Payer: Self-pay | Admitting: Cardiology

## 2021-09-21 DIAGNOSIS — I10 Essential (primary) hypertension: Secondary | ICD-10-CM

## 2021-09-27 ENCOUNTER — Other Ambulatory Visit: Payer: Self-pay | Admitting: Cardiology

## 2021-12-12 ENCOUNTER — Other Ambulatory Visit (INDEPENDENT_AMBULATORY_CARE_PROVIDER_SITE_OTHER): Payer: Self-pay | Admitting: Primary Care

## 2021-12-12 DIAGNOSIS — I10 Essential (primary) hypertension: Secondary | ICD-10-CM

## 2021-12-12 NOTE — Telephone Encounter (Signed)
Requested medications are due for refill today.  no  Requested medications are on the active medications list.  yes  Last refill. 09/23/2021 #90 2 rf  Future visit scheduled.   no  Notes to clinic.  Rx signed by Dr. Radford Pax. Not due for RF.    Requested Prescriptions  Pending Prescriptions Disp Refills   amLODipine (NORVASC) 10 MG tablet [Pharmacy Med Name: AMLODIPINE BESYLATE 10MG  TABLETS] 90 tablet 2    Sig: TAKE 1 TABLET(10 MG) BY MOUTH DAILY     Cardiovascular: Calcium Channel Blockers 2 Failed - 12/12/2021  3:07 AM      Failed - Valid encounter within last 6 months    Recent Outpatient Visits           8 months ago Generalized anxiety disorder   [place] Kerin Perna, NP   1 year ago Essential hypertension   [place], [place], NP   2 years ago Essential hypertension   [place], [place], NP   2 years ago Essential hypertension   [place], Michelle P, NP   2 years ago Generalized anxiety disorder   [place] Kerin Perna, NP              Passed - Last BP in normal range    BP Readings from Last 1 Encounters:  04/10/21 123/83         Passed - Last Heart Rate in normal range    Pulse Readings from Last 1 Encounters:  04/10/21 81

## 2022-11-10 ENCOUNTER — Encounter (HOSPITAL_BASED_OUTPATIENT_CLINIC_OR_DEPARTMENT_OTHER): Payer: Self-pay | Admitting: Family Medicine

## 2022-11-10 ENCOUNTER — Ambulatory Visit (HOSPITAL_BASED_OUTPATIENT_CLINIC_OR_DEPARTMENT_OTHER): Payer: Managed Care, Other (non HMO) | Admitting: Family Medicine

## 2022-11-10 ENCOUNTER — Other Ambulatory Visit (HOSPITAL_BASED_OUTPATIENT_CLINIC_OR_DEPARTMENT_OTHER): Payer: Self-pay

## 2022-11-10 DIAGNOSIS — R7989 Other specified abnormal findings of blood chemistry: Secondary | ICD-10-CM | POA: Diagnosis not present

## 2022-11-10 DIAGNOSIS — R635 Abnormal weight gain: Secondary | ICD-10-CM | POA: Diagnosis not present

## 2022-11-10 DIAGNOSIS — I10 Essential (primary) hypertension: Secondary | ICD-10-CM | POA: Diagnosis not present

## 2022-11-10 DIAGNOSIS — Z7689 Persons encountering health services in other specified circumstances: Secondary | ICD-10-CM | POA: Diagnosis not present

## 2022-11-10 MED ORDER — VALSARTAN-HYDROCHLOROTHIAZIDE 160-25 MG PO TABS
1.0000 | ORAL_TABLET | Freq: Every day | ORAL | 3 refills | Status: DC
  Filled 2022-11-10: qty 34, 34d supply, fill #0

## 2022-11-10 MED ORDER — AMLODIPINE BESYLATE 2.5 MG PO TABS
2.5000 mg | ORAL_TABLET | Freq: Every day | ORAL | 2 refills | Status: DC
  Filled 2022-11-10: qty 30, 30d supply, fill #0

## 2022-11-10 NOTE — Progress Notes (Signed)
New Patient Office Visit  Subjective    Patient ID: Joel Werthmann., male    DOB: February 24, 1981  Age: 42 y.o. MRN: [id number]  CC:  Chief Complaint  Patient presents with   New Patient (Initial Visit)    Pt is here today to get established with the practice. Pt has concerns about his weight, testosterone level decline, and also concerns about his BP.    HPI Joel Rybicki. is a 42 year-old male who presents to establish care. He reports having issues with his weight (unsure if this is r/t low testosterone levels) and HTN. 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   HTN- Prescribed Valsartan HCT 160-25 mg daily and amlodipine 10 mg daily  Started back "a couple of weeks ago" when he started to have dizzy spells at work and reports his BP was "high" (unable to remember the reading). Reports H/A and blurred vision at this time. Denies chest pain, shob, leg swelling.  He reports he has been only taking his valsartan-hydrochlorothiazide daily 160-25mg  daily but is running out of medication. He reports he ran out of amlodipine and has not been taking it since he ran out.  -followed by cards, Q1 year- has not seen this year   OSA w/ CPAP- wearing most nights  Insomnia- trouble falling & staying asleep  Really no relief with trazodone Tried something stronger at [facility] but felt groggy the next morning   Mood- ADHD & anxiety-- zoloft 50mg  daily & buspar TID daily & methylphenidate 36mg - has not been on it recently. Had seen [facility] in the past.  "Felt like his mood was poor prior to weight gain"     11/10/2022    1:23 PM 04/10/2021    4:03 PM 02/21/2021   11:45 AM 06/26/2020    4:15 PM   GAD 7 : Generalized Anxiety Score  Nervous, Anxious, on Edge 2 3 3 1   Control/stop worrying 2 3 3 1   Worry too much - different things 3 3 3 1   Trouble relaxing 3 3 2 1   Restless 3 3 3 1   Easily annoyed or irritable 2 3 3 1   Afraid - awful might happen 2 3 2 1   Total GAD 7 Score 17 21 19 7   Anxiety Difficulty Somewhat difficult Very difficult         11/10/2022    1:23 PM 04/10/2021    4:02 PM 02/21/2021   11:42 AM 06/26/2020    4:15 PM 10/03/2019    4:25 PM  Depression screen PHQ 2/9  Decreased Interest 2 2 2  0 1  Down, Depressed, Hopeless 2 2 2 1 1   PHQ - 2 Score 4 4 4 1 2   Altered sleeping 3 3 3 2 2   Tired, decreased energy 3 3 3 2 2   Change in appetite 3 2 2  0 3  Feeling bad or failure about yourself  2 2 2 1 2   Trouble concentrating 3 2 2  0 2  Moving slowly or fidgety/restless 2 2 3  0 2  Suicidal thoughts 0 1 1 0 0  PHQ-9 Score 20 19 20 6 15   Difficult doing work/chores Somewhat difficult  Outpatient Encounter Medications as of 11/10/2022  Medication Sig   amLODipine (NORVASC) 2.5 MG tablet Take 1 tablet (2.5 mg total) by mouth daily.   Blood Pressure Monitoring (10 SERIES BP MONITOR/UPPER ARM) DEVI USE TWICE DAILY   [DISCONTINUED] valsartan-hydrochlorothiazide (DIOVAN-HCT) 160-25 MG tablet TAKE 1 TABLET BY MOUTH DAILY   busPIRone (BUSPAR) 7.5 MG tablet Take 1 tablet (7.5 mg total) by mouth 3 (three) times daily. (Patient not taking: Reported on 07/16/2021)   methylphenidate 36 MG PO CR tablet Take 36 mg by mouth every morning. (Patient not taking: Reported on 11/10/2022)   sertraline (ZOLOFT) 50 MG tablet Take 50 mg by mouth daily. (Patient not taking: Reported on 11/10/2022)   valsartan-hydrochlorothiazide (DIOVAN-HCT) 160-25 MG tablet Take 1 tablet by mouth daily.   [DISCONTINUED] albuterol (PROVENTIL HFA;VENTOLIN HFA) 108 (90 Base) MCG/ACT inhaler Inhale 2 puffs into the lungs every 6 (six) hours as needed for wheezing or shortness of breath.   [DISCONTINUED]  amLODipine (NORVASC) 10 MG tablet TAKE 1 TABLET(10 MG) BY MOUTH DAILY (Patient not taking: Reported on 11/10/2022)   [DISCONTINUED] Multiple Vitamins-Minerals (MEGA MULTI MEN) TABS Take 1 tablet by mouth daily.   [DISCONTINUED] traZODone (DESYREL) 50 MG tablet TAKE 1 TO 1 AND 1/2 TABLETS AT NIGHT FOR INSOMNIA (Patient not taking: Reported on 11/10/2022)   No facility-administered encounter medications on file as of 11/10/2022.    Past Medical History:  Diagnosis Date   ADHD (attention deficit hyperactivity disorder)    diagnosed 2012 by psychiatry, [facility]   Allergy    Anxiety    Asthma    Depression    DVT (deep venous thrombosis) (HCC)    Gastric ulcer    GERD (gastroesophageal reflux disease)    Hypertension    Low testosterone    Migraine    Sleep apnea    Wears glasses     Past Surgical History:  Procedure Laterality Date   COLONOSCOPY  03/17/2005   ESOPHAGOGASTRODUODENOSCOPY  03/17/2005    Family History  Problem Relation Age of Onset   Asthma Mother    Ulcers Father    Cancer Paternal Aunt    ADD / ADHD Paternal Aunt    Cancer Paternal Grandmother    Clotting disorder Paternal Grandmother    Clotting disorder Maternal Uncle    Clotting disorder Cousin    Heart disease Neg Hx    Stroke Neg Hx      Review of Systems  Constitutional:  Negative for chills, fever and malaise/fatigue.  Eyes:  Negative for blurred vision and double vision.  Respiratory:  Negative for cough and shortness of breath.   Cardiovascular:  Negative for chest pain, palpitations, claudication and leg swelling.  Gastrointestinal:  Negative for abdominal pain, constipation, diarrhea, nausea and vomiting.  Genitourinary:  Negative for frequency and urgency.  Musculoskeletal:  Negative for myalgias.  Neurological:  Negative for dizziness, weakness and headaches.  Psychiatric/Behavioral:  Negative for depression, hallucinations, substance abuse and suicidal ideas. The patient has  insomnia. The patient is not nervous/anxious.      Objective    BP (!) 160/98   Pulse 94   Ht 5\' 10"  (1.778 m)   Wt (!) 328 lb 6.4 oz (149 kg)   SpO2 99%   BMI 47.12 kg/m   Physical Exam Constitutional:      Appearance: Normal appearance. He is obese.  Cardiovascular:     Rate and Rhythm: Normal rate and regular rhythm.     Pulses: Normal pulses.  Heart sounds: Normal heart sounds.  Pulmonary:     Effort: Pulmonary effort is normal.     Breath sounds: Normal breath sounds.  Neurological:     Mental Status: He is alert.  Psychiatric:        Mood and Affect: Mood normal.        Behavior: Behavior normal.       Assessment & Plan:   1. Encounter to establish care Patient is a pleasant 42 year old male patient who presents today to establish care with new primary care provider.  Reviewed the past medical history, family history, social history, surgical history, current/past medications and allergies today- updates made as indicated. Patient has concerns today about his weight gain and hypertension.  - Hemoglobin A1c  2. Morbid obesity (HCC) Patient reports being physically active and monitoring his diet, but still experiences weight gain and deals with morbid obesity. Will refer patient to [facility] for further evaluation and management.  - Amb Ref to [facility]  3. Essential hypertension Patient presents today with elevated blood pressure. Patient in no acute distress and is well-appearing. Denies chest pain, shortness of breath, lower extremity edema, vision changes, headaches. Cardiovascular exam with heart regular rate and rhythm. Normal heart sounds, no murmurs present. No lower extremity edema present. Lungs clear to auscultation bilaterally. Patient is currently taking valsartan-hydrochlorothiazide 160-25mg  daily. Refills provided today. Will restart amlodipine 2.5mg  daily. Advised patient to closely monitor blood pressure as we increase  his medication regimen. Return to office sooner if blood pressure begins to increase greater than 130/80. Follow-up in 2-4 weeks.    - Comprehensive metabolic panel  4. Low testosterone in male Patient reports he has a history of low testosterone in the past that was related to weight gain. He was prescribed Natesto in the past but reports he is no longer taking this medication.  - Testosterone,Free and Total - CBC with Differential/Platelet  5. Weight gain Patient reports being physically active and monitoring his diet, but still experiences weight gain and deals with morbid obesity. Will refer patient to [facility] for further evaluation and management. Plan to assess thyroid function today.  - TSH Rfx on Abnormal to Free T4   Return in about 4 weeks (around 12/08/2022) for HTN follow-up.   Alyson Reedy, FNP

## 2022-11-12 ENCOUNTER — Encounter (HOSPITAL_BASED_OUTPATIENT_CLINIC_OR_DEPARTMENT_OTHER): Payer: Self-pay | Admitting: Family Medicine

## 2022-11-12 DIAGNOSIS — F419 Anxiety disorder, unspecified: Secondary | ICD-10-CM

## 2022-11-12 LAB — COMPREHENSIVE METABOLIC PANEL
ALT: 17 IU/L (ref 0–44)
AST: 18 IU/L (ref 0–40)
Albumin: 4.2 g/dL (ref 4.1–5.1)
Alkaline Phosphatase: 59 IU/L (ref 44–121)
BUN/Creatinine Ratio: 11 (ref 9–20)
BUN: 10 mg/dL (ref 6–24)
Bilirubin Total: 0.3 mg/dL (ref 0.0–1.2)
CO2: 23 mmol/L (ref 20–29)
Calcium: 9.5 mg/dL (ref 8.7–10.2)
Chloride: 105 mmol/L (ref 96–106)
Creatinine, Ser: 0.91 mg/dL (ref 0.76–1.27)
Globulin, Total: 2.5 g/dL (ref 1.5–4.5)
Glucose: 82 mg/dL (ref 70–99)
Potassium: 4 mmol/L (ref 3.5–5.2)
Sodium: 141 mmol/L (ref 134–144)
Total Protein: 6.7 g/dL (ref 6.0–8.5)
eGFR: 109 mL/min/{1.73_m2} (ref >59)

## 2022-11-12 LAB — CBC WITH DIFFERENTIAL/PLATELET
Basophils Absolute: 0 10*3/uL (ref 0.0–0.2)
Basos: 1 %
EOS (ABSOLUTE): 0.1 10*3/uL (ref 0.0–0.4)
Eos: 1 %
Hematocrit: 42.1 % (ref 37.5–51.0)
Hemoglobin: 14.2 g/dL (ref 13.0–17.7)
Immature Grans (Abs): 0.1 10*3/uL (ref 0.0–0.1)
Immature Granulocytes: 1 %
Lymphocytes Absolute: 3.5 10*3/uL — ABNORMAL HIGH (ref 0.7–3.1)
Lymphs: 48 %
MCH: 28.2 pg (ref 26.6–33.0)
MCHC: 33.7 g/dL (ref 31.5–35.7)
MCV: 84 fL (ref 79–97)
Monocytes Absolute: 0.6 10*3/uL (ref 0.1–0.9)
Monocytes: 8 %
Neutrophils Absolute: 3 10*3/uL (ref 1.4–7.0)
Neutrophils: 41 %
Platelets: 269 10*3/uL (ref 150–450)
RBC: 5.03 x10E6/uL (ref 4.14–5.80)
RDW: 13.9 % (ref 11.6–15.4)
WBC: 7.3 10*3/uL (ref 3.4–10.8)

## 2022-11-12 LAB — HEMOGLOBIN A1C
Est. average glucose Bld gHb Est-mCnc: 134 mg/dL
Hgb A1c MFr Bld: 6.3 % — ABNORMAL HIGH (ref 4.8–5.6)

## 2022-11-12 LAB — TESTOSTERONE,FREE AND TOTAL
Testosterone, Free: 4.8 pg/mL — ABNORMAL LOW (ref 6.8–21.5)
Testosterone: 200 ng/dL — ABNORMAL LOW (ref 264–916)

## 2022-11-12 LAB — TSH RFX ON ABNORMAL TO FREE T4: TSH: 2.54 u[IU]/mL (ref 0.450–4.500)

## 2022-11-13 ENCOUNTER — Encounter (HOSPITAL_BASED_OUTPATIENT_CLINIC_OR_DEPARTMENT_OTHER): Payer: Self-pay | Admitting: Family Medicine

## 2022-11-13 MED ORDER — SERTRALINE HCL 25 MG PO TABS: 25.0000 mg | ORAL_TABLET | Freq: Every day | ORAL | 2 refills | Status: DC

## 2022-11-24 ENCOUNTER — Other Ambulatory Visit (HOSPITAL_BASED_OUTPATIENT_CLINIC_OR_DEPARTMENT_OTHER): Payer: Self-pay

## 2022-11-24 DIAGNOSIS — R7989 Other specified abnormal findings of blood chemistry: Secondary | ICD-10-CM

## 2022-11-25 ENCOUNTER — Other Ambulatory Visit (HOSPITAL_BASED_OUTPATIENT_CLINIC_OR_DEPARTMENT_OTHER): Payer: Self-pay | Admitting: Family Medicine

## 2022-11-28 ENCOUNTER — Encounter: Payer: Self-pay | Admitting: Pharmacist

## 2022-11-30 ENCOUNTER — Other Ambulatory Visit (HOSPITAL_BASED_OUTPATIENT_CLINIC_OR_DEPARTMENT_OTHER): Payer: Self-pay | Admitting: Family Medicine

## 2022-12-01 ENCOUNTER — Other Ambulatory Visit (HOSPITAL_BASED_OUTPATIENT_CLINIC_OR_DEPARTMENT_OTHER): Payer: Self-pay | Admitting: Family Medicine

## 2022-12-01 DIAGNOSIS — F900 Attention-deficit hyperactivity disorder, predominantly inattentive type: Secondary | ICD-10-CM

## 2022-12-01 MED ORDER — METHYLPHENIDATE HCL ER (OSM) 18 MG PO TBCR: 18.0000 mg | EXTENDED_RELEASE_TABLET | Freq: Every day | ORAL | 0 refills | Status: DC

## 2022-12-01 NOTE — Progress Notes (Signed)
PDMP reviewed. No red flags. Refill sent for lowest dose.

## 2022-12-04 ENCOUNTER — Encounter: Payer: Self-pay | Admitting: Urology

## 2022-12-04 ENCOUNTER — Ambulatory Visit: Payer: Managed Care, Other (non HMO) | Admitting: Urology

## 2022-12-04 VITALS — BP 138/93 | HR 73 | Ht 70.0 in | Wt 320.0 lb

## 2022-12-04 DIAGNOSIS — E291 Testicular hypofunction: Secondary | ICD-10-CM | POA: Diagnosis not present

## 2022-12-04 DIAGNOSIS — R7989 Other specified abnormal findings of blood chemistry: Secondary | ICD-10-CM

## 2022-12-04 DIAGNOSIS — N529 Male erectile dysfunction, unspecified: Secondary | ICD-10-CM

## 2022-12-04 MED ORDER — TADALAFIL 20 MG PO TABS
20.0000 mg | ORAL_TABLET | ORAL | 5 refills | Status: DC
Start: 2022-12-04 — End: 2023-01-05

## 2022-12-04 NOTE — Progress Notes (Addendum)
Assessment: 1. Low testosterone   2. Erectile dysfunction of organic origin      Plan: Today I had a long discussion with the patient regarding his erectile dysfunction as well as low testosterone. I have recommended further evaluation of his low testosterone with full hormone panel including measurement of estradiol given his obesity. This will also include 2 separate morning testosterone levels.  In terms of his ED we discussed medical management options.  He would like to try new medical therapy. Rx: Tadalafil 20 mg as needed Ashby Dawes of medication including proper utilization as well as potential adverse events and side effects reviewed. Patient will return in approximately 2 weeks after the above laboratory studies to make further recommendation based on findings.  Addendum 12/05/2022-- Hormone panel  Total testosterone 275 SHBG = 18.7 Albumin = 4.2 Estradiol (24.2, LH, and prolactin normal)  Calculated free and bioavailable-- BAT = 168 FT = 7.2  Chief Complaint: low testosterone  History of Present Illness:  Joel Sanda. is a 42 y.o. male who is seen in consultation from Alyson Reedy, FNP for evaluation of low testosterone. Patient reports approximately 10-year history of known low testosterone.  He was previously on a transdermal product provided by his PCP 6 or 7 years ago. He does report decreased libido as well as low energy and poor exercise recovery. In addition, patient does have erectile dysfunction.  He reports that often times he does not obtain an erection sufficient for intercourse.  He has not tried any ED medications.  Prior testosterone results: 10/2022  200 11/2018  271 12/2015 235  Past Medical History:  Past Medical History:  Diagnosis Date   ADHD (attention deficit hyperactivity disorder)    diagnosed 2012 by psychiatry, [facility]   Allergy    Anxiety    Asthma    Depression    DVT (deep venous thrombosis) (HCC)     Gastric ulcer    GERD (gastroesophageal reflux disease)    Hypertension    Low testosterone    Migraine    Sleep apnea    Wears glasses     Past Surgical History:  Past Surgical History:  Procedure Laterality Date   COLONOSCOPY  03/17/2005   ESOPHAGOGASTRODUODENOSCOPY  03/17/2005    Allergies:  No Known Allergies  Family History:  Family History  Problem Relation Age of Onset   Asthma Mother    Ulcers Father    Cancer Paternal Aunt    ADD / ADHD Paternal Aunt    Cancer Paternal Grandmother    Clotting disorder Paternal Grandmother    Clotting disorder Maternal Uncle    Clotting disorder Cousin    Heart disease Neg Hx    Stroke Neg Hx     Social History:  Social History   Tobacco Use   Smoking status: Never   Smokeless tobacco: Never  Vaping Use   Vaping status: Never Used  Substance Use Topics   Alcohol use: Not Currently    Comment: 2 twice a month    Drug use: No    Review of symptoms:  Constitutional:  Negative for unexplained weight loss, night sweats, fever, chills ENT:  Negative for nose bleeds, sinus pain, painful swallowing CV:  Negative for chest pain, shortness of breath, exercise intolerance, palpitations, loss of consciousness Resp:  Negative for cough, wheezing, shortness of breath GI:  Negative for nausea, vomiting, diarrhea, bloody stools GU:  Positives noted in HPI; otherwise negative for gross hematuria, dysuria, urinary incontinence Neuro:  Negative for seizures, poor balance, limb weakness, slurred speech Psych:  Negative for lack of energy, depression, anxiety Endocrine:  Negative for polydipsia, polyuria, symptoms of hypoglycemia (dizziness, hunger, sweating) Hematologic:  Negative for anemia, purpura, petechia, prolonged or excessive bleeding, use of anticoagulants  Allergic:  Negative for difficulty breathing or choking as a result of exposure to anything; no shellfish allergy; no allergic response (rash/itch) to materials,  foods  Physical exam: BP (!) 138/93   Pulse 73   Ht 5\' 10"  (1.778 m)   Wt (!) 320 lb (145.2 kg)   BMI 45.92 kg/m  GENERAL APPEARANCE:  Well appearing, well developed, well nourished, NAD

## 2022-12-05 LAB — LUTEINIZING HORMONE: LH: 3 m[IU]/mL (ref 1.7–8.6)

## 2022-12-05 LAB — TESTOSTERONE: Testosterone: 275 ng/dL (ref 264–916)

## 2022-12-05 LAB — ESTRADIOL: Estradiol: 24.2 pg/mL (ref 7.6–42.6)

## 2022-12-05 LAB — SEX HORMONE BINDING GLOBULIN: Sex Hormone Binding: 18.7 nmol/L (ref 16.5–55.9)

## 2022-12-05 LAB — PROLACTIN: Prolactin: 6.9 ng/mL (ref 3.9–22.7)

## 2022-12-08 ENCOUNTER — Ambulatory Visit (HOSPITAL_BASED_OUTPATIENT_CLINIC_OR_DEPARTMENT_OTHER): Payer: Managed Care, Other (non HMO) | Admitting: Family Medicine

## 2022-12-08 ENCOUNTER — Encounter (HOSPITAL_BASED_OUTPATIENT_CLINIC_OR_DEPARTMENT_OTHER): Payer: Self-pay | Admitting: Family Medicine

## 2022-12-08 VITALS — BP 131/91 | HR 67 | Ht 70.0 in | Wt 327.0 lb

## 2022-12-08 DIAGNOSIS — F339 Major depressive disorder, recurrent, unspecified: Secondary | ICD-10-CM | POA: Diagnosis not present

## 2022-12-08 DIAGNOSIS — F900 Attention-deficit hyperactivity disorder, predominantly inattentive type: Secondary | ICD-10-CM | POA: Diagnosis not present

## 2022-12-08 DIAGNOSIS — F419 Anxiety disorder, unspecified: Secondary | ICD-10-CM | POA: Diagnosis not present

## 2022-12-08 DIAGNOSIS — I10 Essential (primary) hypertension: Secondary | ICD-10-CM

## 2022-12-08 MED ORDER — SERTRALINE HCL 50 MG PO TABS
50.0000 mg | ORAL_TABLET | Freq: Every day | ORAL | 2 refills | Status: DC
Start: 2022-12-08 — End: 2022-12-30

## 2022-12-08 MED ORDER — BLOOD PRESSURE CUFF MISC: 1.0000 | Freq: Two times a day (BID) | 0 refills | Status: AC | PRN

## 2022-12-08 MED ORDER — AMLODIPINE BESYLATE 5 MG PO TABS: 5.0000 mg | ORAL_TABLET | Freq: Every day | ORAL | 2 refills | Status: DC

## 2022-12-08 NOTE — Assessment & Plan Note (Signed)
Patient was prescribed Concerta 18mg  daily but did not know that this medication was sent to the pharmacy. Advised him to pick up at pharmacy (notify office if prescription needs resent). Will start at lowest dose of Concerta and plan to increase as necessary for adequate symptom management.

## 2022-12-08 NOTE — Progress Notes (Signed)
Established Patient Office Visit  Subjective   Patient ID: Joel Panik., male    DOB: 03-21-1980  Age: 42 y.o. MRN: [id number]  HYPERTENSION: [name] Is a 42 year old male patient who presents for the medical management of hypertension and mood follow-up.  Patient's current hypertension medication regimen is: amlodipine 2.5mg  daily  Patient is currently taking prescribed medications for HTN.  Patient is not regularly keeping a check on BP at home. Does not have a BP cuff.  Adhering to low sodium diet: low sodium  Exercising Regularly: daily activity  Denies headache, dizziness, CP, SHOB, vision changes.    BP Readings from Last 3 Encounters:  12/08/22 (!) 131/91  12/04/22 (!) 138/93  11/10/22 (!) 160/98   ANXIETY/DEPRESSION: Patient also presents for the medical management of anxiety.  Current medication regimen: zoloft 25mg  daily  He has been on Zoloft 50-100mg  daily. Was on Buspar in the past 7.5 TID in the past.  Counseling: interested  Well controlled: no Denies SI/HI.      12/08/2022    8:18 AM 11/10/2022    1:23 PM 04/10/2021    4:03 PM 02/21/2021   11:45 AM  GAD 7 : Generalized Anxiety Score  Nervous, Anxious, on Edge 3 2 3 3   Control/stop worrying 3 2 3 3   Worry too much - different things 3 3 3 3   Trouble relaxing 3 3 3 2   Restless 3 3 3 3   Easily annoyed or irritable 3 2 3 3   Afraid - awful might happen 2 2 3 2   Total GAD 7 Score 20 17 21 19   Anxiety Difficulty Very difficult Somewhat difficult Very difficult       12/08/2022    8:18 AM 11/10/2022    1:23 PM 04/10/2021    4:02 PM  PHQ9 SCORE ONLY  PHQ-9 Total Score 20 20 19    Review of Systems  Constitutional:  Negative for malaise/fatigue.  Eyes:  Negative for blurred vision and double vision.  Respiratory:  Negative for cough and shortness of breath.   Cardiovascular:  Negative for chest pain, palpitations and leg swelling.  Gastrointestinal:  Negative for abdominal pain, nausea and  vomiting.  Musculoskeletal:  Negative for myalgias.  Neurological:  Negative for dizziness, weakness and headaches.  Psychiatric/Behavioral:  Positive for depression. Negative for suicidal ideas. The patient is nervous/anxious. The patient does not have insomnia.      Objective:     BP (!) 131/91   Pulse 67   Ht 5\' 10"  (1.778 m)   Wt (!) 327 lb (148.3 kg)   SpO2 99%   BMI 46.92 kg/m  BP Readings from Last 3 Encounters:  12/08/22 (!) 131/91  12/04/22 (!) 138/93  11/10/22 (!) 160/98     Physical Exam Constitutional:      Appearance: Normal appearance.  Cardiovascular:     Rate and Rhythm: Normal rate and regular rhythm.     Pulses: Normal pulses.     Heart sounds: Normal heart sounds.  Pulmonary:     Effort: Pulmonary effort is normal.     Breath sounds: Normal breath sounds.  Neurological:     Mental Status: He is alert.  Psychiatric:        Mood and Affect: Mood is depressed. Affect is flat.        Behavior: Behavior normal.        Judgment: Judgment normal.       Assessment & Plan:  Essential hypertension Assessment & Plan: Patient presents  today with slightly elevated blood pressure, recheck systolic number improved. Patient in no acute distress and is well-appearing. Denies chest pain, shortness of breath, lower extremity edema, vision changes, headaches. Cardiovascular exam with heart regular rate and rhythm. Normal heart sounds, no murmurs present. No lower extremity edema present. Lungs clear to auscultation bilaterally. Patient is currently taking amlodipine 2.5mg  daily- increasing dose to 5mg  daily. Refills provided today. Discussed monitoring blood pressure at home- gave patient paper prescription for BP cuff and advised him to contact insurance regarding coverage. Return to office sooner if blood pressure begins to increase greater than 130/80. Follow-up in 4 weeks.      Orders: -     amLODIPine Besylate; Take 1 tablet (5 mg total) by mouth daily.  Dispense:  90 tablet; Refill: 2 -     Blood Pressure Cuff; 1 each by Does not apply route 2 (two) times daily as needed.  Dispense: 1 each; Refill: 0  Depression, recurrent (HCC) Assessment & Plan: Patient has a history of depression. PHQ9 completed, with score of 20. Patient denies active/passive suicidal ideations. He is currently taking Zoloft 25mg  daily as prescribed and reports not noticing a difference in his mood. PHQ9 completed last visit with score of 20 also. Discussed medication management- patient agreeable to increase dose to 50 mg for two weeks and then increase further to 100mg  after the two weeks. Follow-up in 4 weeks for mood. Discussed counseling services, patient agreeable. Referral placed.   Orders: -     Sertraline HCl; Take 1 tablet (50 mg total) by mouth daily. Take 50mg  for 2 weeks and then start taking 100 mg on 12/22/2022.  Dispense: 60 tablet; Refill: 2 -     Ambulatory referral to Behavioral Health  Anxiety Assessment & Plan: Patient reports history of anxiety. GAD7 completed today, with score of 20. Denies improvement in mood with current medication regimen. Plan to increase zoloft today. Patient has been on buspar 7.5mg  TID in the past. Plan to increase Zoloft today to 50mg  daily for two weeks and 100mg  daily thereafter. If patient does not notice an improvement in his anxiety, we can add Buspar.   Orders: -     Sertraline HCl; Take 1 tablet (50 mg total) by mouth daily. Take 50mg  for 2 weeks and then start taking 100 mg on 12/22/2022.  Dispense: 60 tablet; Refill: 2  Attention deficit hyperactivity disorder (ADHD), predominantly inattentive type Assessment & Plan: Patient was prescribed Concerta 18mg  daily but did not know that this medication was sent to the pharmacy. Advised him to pick up at pharmacy (notify office if prescription needs resent). Will start at lowest dose of Concerta and plan to increase as necessary for adequate symptom management.      Return in about 4  weeks (around 01/05/2023) for HTN follow-up, Mood f/u.    Alyson Reedy, FNP

## 2022-12-08 NOTE — Assessment & Plan Note (Signed)
Patient reports history of anxiety. GAD7 completed today, with score of 20. Denies improvement in mood with current medication regimen. Plan to increase zoloft today. Patient has been on buspar 7.5mg  TID in the past. Plan to increase Zoloft today to 50mg  daily for two weeks and 100mg  daily thereafter. If patient does not notice an improvement in his anxiety, we can add Buspar.

## 2022-12-08 NOTE — Assessment & Plan Note (Addendum)
Patient has a history of depression. PHQ9 completed, with score of 20. Patient denies active/passive suicidal ideations. He is currently taking Zoloft 25mg  daily as prescribed and reports not noticing a difference in his mood. PHQ9 completed last visit with score of 20 also. Discussed medication management- patient agreeable to increase dose to 50 mg for two weeks and then increase further to 100mg  after the two weeks. Follow-up in 4 weeks for mood. Discussed counseling services, patient agreeable. Referral placed.

## 2022-12-08 NOTE — Assessment & Plan Note (Signed)
Patient presents today with slightly elevated blood pressure, recheck systolic number improved. Patient in no acute distress and is well-appearing. Denies chest pain, shortness of breath, lower extremity edema, vision changes, headaches. Cardiovascular exam with heart regular rate and rhythm. Normal heart sounds, no murmurs present. No lower extremity edema present. Lungs clear to auscultation bilaterally. Patient is currently taking amlodipine 2.5mg  daily- increasing dose to 5mg  daily. Refills provided today. Discussed monitoring blood pressure at home- gave patient paper prescription for BP cuff and advised him to contact insurance regarding coverage. Return to office sooner if blood pressure begins to increase greater than 130/80. Follow-up in 4 weeks.

## 2022-12-11 ENCOUNTER — Other Ambulatory Visit: Payer: Self-pay

## 2022-12-11 ENCOUNTER — Other Ambulatory Visit: Payer: Managed Care, Other (non HMO)

## 2022-12-11 DIAGNOSIS — R7989 Other specified abnormal findings of blood chemistry: Secondary | ICD-10-CM

## 2022-12-11 DIAGNOSIS — N529 Male erectile dysfunction, unspecified: Secondary | ICD-10-CM

## 2022-12-12 LAB — SEX HORMONE BINDING GLOBULIN: Sex Hormone Binding: 19.4 nmol/L (ref 16.5–55.9)

## 2022-12-12 LAB — PROLACTIN: Prolactin: 6.3 ng/mL (ref 3.9–22.7)

## 2022-12-12 LAB — LUTEINIZING HORMONE: LH: 4.6 m[IU]/mL (ref 1.7–8.6)

## 2022-12-12 LAB — ESTRADIOL: Estradiol: 10.8 pg/mL (ref 7.6–42.6)

## 2022-12-12 LAB — TESTOSTERONE: Testosterone: 278 ng/dL (ref 264–916)

## 2022-12-29 ENCOUNTER — Encounter (HOSPITAL_BASED_OUTPATIENT_CLINIC_OR_DEPARTMENT_OTHER): Payer: Self-pay | Admitting: Family Medicine

## 2022-12-30 ENCOUNTER — Other Ambulatory Visit (HOSPITAL_BASED_OUTPATIENT_CLINIC_OR_DEPARTMENT_OTHER): Payer: Self-pay | Admitting: Family Medicine

## 2022-12-30 DIAGNOSIS — F339 Major depressive disorder, recurrent, unspecified: Secondary | ICD-10-CM

## 2022-12-30 DIAGNOSIS — F419 Anxiety disorder, unspecified: Secondary | ICD-10-CM

## 2023-01-02 ENCOUNTER — Other Ambulatory Visit (HOSPITAL_BASED_OUTPATIENT_CLINIC_OR_DEPARTMENT_OTHER): Payer: Self-pay

## 2023-01-05 ENCOUNTER — Encounter (HOSPITAL_BASED_OUTPATIENT_CLINIC_OR_DEPARTMENT_OTHER): Payer: Self-pay | Admitting: Family Medicine

## 2023-01-05 ENCOUNTER — Ambulatory Visit (HOSPITAL_BASED_OUTPATIENT_CLINIC_OR_DEPARTMENT_OTHER): Payer: Managed Care, Other (non HMO) | Admitting: Family Medicine

## 2023-01-05 VITALS — BP 139/99 | HR 62 | Ht 70.0 in | Wt 328.0 lb

## 2023-01-05 DIAGNOSIS — F419 Anxiety disorder, unspecified: Secondary | ICD-10-CM | POA: Diagnosis not present

## 2023-01-05 DIAGNOSIS — F900 Attention-deficit hyperactivity disorder, predominantly inattentive type: Secondary | ICD-10-CM

## 2023-01-05 DIAGNOSIS — I10 Essential (primary) hypertension: Secondary | ICD-10-CM

## 2023-01-05 DIAGNOSIS — F339 Major depressive disorder, recurrent, unspecified: Secondary | ICD-10-CM | POA: Diagnosis not present

## 2023-01-05 MED ORDER — SERTRALINE HCL 50 MG PO TABS: 50.0000 mg | ORAL_TABLET | Freq: Every day | ORAL | 3 refills | Status: DC

## 2023-01-05 MED ORDER — AMLODIPINE BESYLATE 10 MG PO TABS: 10.0000 mg | ORAL_TABLET | Freq: Every day | ORAL | 3 refills | Status: AC

## 2023-01-05 MED ORDER — METHYLPHENIDATE HCL ER (OSM) 27 MG PO TBCR
27.0000 mg | EXTENDED_RELEASE_TABLET | Freq: Every day | ORAL | 0 refills | Status: DC
Start: 2023-01-05 — End: 2023-02-03

## 2023-01-05 MED ORDER — SERTRALINE HCL 100 MG PO TABS
100.0000 mg | ORAL_TABLET | Freq: Every day | ORAL | 3 refills | Status: DC
Start: 2023-01-05 — End: 2023-03-17

## 2023-01-05 NOTE — Assessment & Plan Note (Signed)
Patient reports that his inattention does not feel well controlled on current dose and would like to increase dose today. He has been on Concerta 36mg  as maximum dose in the past. Plan to increase from 18mg  to 27mg  daily. Review of patient's previous medical records- has been on Focalin and Adderall in the past. He reports not noticing an improvement in symptoms on Concerta 18mg - will increase dose to 27mg  today. PDMP reviewed, no red flags present.

## 2023-01-05 NOTE — Assessment & Plan Note (Signed)
Patient presents today with elevated blood pressure, repeat with slight improvement. Patient in no acute distress and is well-appearing. Denies chest pain, shortness of breath, lower extremity edema, vision changes, headaches. Cardiovascular exam with heart regular rate and rhythm. Normal heart sounds, no murmurs present. No lower extremity edema present. Lungs clear to auscultation bilaterally. Patient is currently taking valsartan-HCTZ 160-25mg  daily & amlodipine 5mg  daily. Would like better blood pressure control. Discussed with patient increasing amlodipine to 10mg  daily. Refills provided today. Return to office sooner if blood pressure begins to increase greater than 130/80. Follow-up in 4-6 weeks.

## 2023-01-05 NOTE — Progress Notes (Signed)
Established Patient Office Visit  Subjective   Patient ID: Joel Wallace., male    DOB: 1980/06/12  Age: 42 y.o. MRN: [id number]  HYPERTENSION: Jos Flewelling. is a 42 year old male patient who presents for the medical management of hypertension.  Patient's current hypertension medication regimen is: valsartan-HCTZ 160-25mg  daily & amlodipine 5mg  daily  Patient is currently taking prescribed medications for HTN.  Patient is regularly keeping a check on BP when he "goes to the store." Adhering to low sodium diet: yes Exercising Regularly: yes Denies headache, dizziness, CP, SHOB, vision changes.    BP Readings from Last 3 Encounters:  01/05/23 (!) 139/99  12/08/22 (!) 131/91  12/04/22 (!) 138/93   He also presents for the medical management of anxiety & depression.   Current medication regimen: zoloft 100mg  daily currently  Counseling: plans to schedule with previous counselor  Well controlled: no Denies SI/HI.  Anxiety kicks in throughout the evening.      01/05/2023    8:43 AM 12/08/2022    8:18 AM 11/10/2022    1:23 PM 04/10/2021    4:03 PM  GAD 7 : Generalized Anxiety Score  Nervous, Anxious, on Edge 3 3 2 3   Control/stop worrying 3 3 2 3   Worry too much - different things 3 3 3 3   Trouble relaxing 3 3 3 3   Restless 3 3 3 3   Easily annoyed or irritable 3 3 2 3   Afraid - awful might happen 2 2 2 3   Total GAD 7 Score 20 20 17 21   Anxiety Difficulty Very difficult Very difficult Somewhat difficult Very difficult      01/05/2023    8:44 AM 12/08/2022    8:18 AM 11/10/2022    1:23 PM  PHQ9 SCORE ONLY  PHQ-9 Total Score 21 20 20    Review of Systems  Constitutional:  Negative for malaise/fatigue and weight loss.  Respiratory:  Negative for cough and shortness of breath.   Cardiovascular:  Negative for chest pain, palpitations and leg swelling.  Gastrointestinal:  Negative for abdominal pain, nausea and vomiting.  Musculoskeletal:  Negative for myalgias.   Neurological:  Negative for dizziness, weakness and headaches.  Psychiatric/Behavioral:  Positive for depression. Negative for substance abuse and suicidal ideas. The patient is nervous/anxious.       Objective:    BP (!) 139/99 Comment: Repeat BP  Pulse 62   Ht 5\' 10"  (1.778 m)   Wt (!) 328 lb (148.8 kg)   SpO2 99%   BMI 47.06 kg/m  BP Readings from Last 3 Encounters:  01/05/23 (!) 139/99  12/08/22 (!) 131/91  12/04/22 (!) 138/93     Physical Exam Constitutional:      Appearance: Normal appearance.  Cardiovascular:     Rate and Rhythm: Normal rate and regular rhythm.     Pulses: Normal pulses.     Heart sounds: Normal heart sounds.  Pulmonary:     Effort: Pulmonary effort is normal.     Breath sounds: Normal breath sounds.  Musculoskeletal:     Right lower leg: No edema.     Left lower leg: No edema.  Neurological:     Mental Status: He is alert.  Psychiatric:        Mood and Affect: Mood normal.        Behavior: Behavior normal.        Thought Content: Thought content normal.        Judgment: Judgment normal.  Assessment & Plan:   Essential hypertension Assessment & Plan: Patient presents today with elevated blood pressure, repeat with slight improvement. Patient in no acute distress and is well-appearing. Denies chest pain, shortness of breath, lower extremity edema, vision changes, headaches. Cardiovascular exam with heart regular rate and rhythm. Normal heart sounds, no murmurs present. No lower extremity edema present. Lungs clear to auscultation bilaterally. Patient is currently taking valsartan-HCTZ 160-25mg  daily & amlodipine 5mg  daily. Would like better blood pressure control. Discussed with patient increasing amlodipine to 10mg  daily. Refills provided today. Return to office sooner if blood pressure begins to increase greater than 130/80. Follow-up in 4-6 weeks.      Orders: -     amLODIPine Besylate; Take 1 tablet (10 mg total) by mouth daily.   Dispense: 90 tablet; Refill: 3  Attention deficit hyperactivity disorder (ADHD), predominantly inattentive type Assessment & Plan: Patient reports that his inattention does not feel well controlled on current dose and would like to increase dose today. He has been on Concerta 36mg  as maximum dose in the past. Plan to increase from 18mg  to 27mg  daily. Review of patient's previous medical records- has been on Focalin and Adderall in the past. He reports not noticing an improvement in symptoms on Concerta 18mg - will increase dose to 27mg  today. PDMP reviewed, no red flags present.   Orders: -     Methylphenidate HCl ER (OSM); Take 1 tablet (27 mg total) by mouth daily.  Dispense: 30 tablet; Refill: 0  Depression, recurrent (HCC) Assessment & Plan: PHQ9 completed with score of 21. Denies passive and active suicidal ideations. Patient reports he has been taking Zoloft 100mg  daily with no improvement in his symptoms. Discussed cognitive behavioral therapy (CBT) and additional benefits shown along with pharmacotherapy. If patient continues to experience resistant depression, may be reasonable to refer to psychiatry.   Orders: -     Sertraline HCl; Take 1 tablet (100 mg total) by mouth daily.  Dispense: 90 tablet; Refill: 3 -     Sertraline HCl; Take 1 tablet (50 mg total) by mouth daily.  Dispense: 90 tablet; Refill: 3  Anxiety Assessment & Plan: GAD7 completed with score of 20, no improvement since last office visit. Reports that his anxiety often increases at night and makes falling asleep difficult. Will increase zoloft from 100,g every day to 150mg  every day.   Orders: -     Sertraline HCl; Take 1 tablet (100 mg total) by mouth daily.  Dispense: 90 tablet; Refill: 3 -     Sertraline HCl; Take 1 tablet (50 mg total) by mouth daily.  Dispense: 90 tablet; Refill: 3  Morbid obesity (HCC) Assessment & Plan: BMI today 47.06. Hemoglobin A1c last checked on 11/10/2022 with results of 6.3. Discussed  lifestyle modifications- including healthy nutrition and daily exercise. Patient has been focusing on these and reports no noticeable weight loss. Patient may benefits from weight loss pharmacotherapy. Did not discuss today but will consider at next visit.      Return in about 4 weeks (around 02/02/2023) for Mood f/u, HTN follow-up.    Alyson Reedy, FNP

## 2023-01-05 NOTE — Assessment & Plan Note (Addendum)
BMI today 47.06. Hemoglobin A1c last checked on 11/10/2022 with results of 6.3. Discussed lifestyle modifications- including healthy nutrition and daily exercise. Patient has been focusing on these and reports no noticeable weight loss. Patient may benefits from weight loss pharmacotherapy. Did not discuss today but will consider at next visit.

## 2023-01-05 NOTE — Assessment & Plan Note (Addendum)
GAD7 completed with score of 20, no improvement since last office visit. Reports that his anxiety often increases at night and makes falling asleep difficult. Will increase zoloft from 100,g every day to 150mg  every day.

## 2023-01-05 NOTE — Assessment & Plan Note (Signed)
PHQ9 completed with score of 21. Denies passive and active suicidal ideations. Patient reports he has been taking Zoloft 100mg  daily with no improvement in his symptoms. Discussed cognitive behavioral therapy (CBT) and additional benefits shown along with pharmacotherapy. If patient continues to experience resistant depression, may be reasonable to refer to psychiatry.

## 2023-01-07 ENCOUNTER — Encounter (HOSPITAL_BASED_OUTPATIENT_CLINIC_OR_DEPARTMENT_OTHER): Payer: Self-pay | Admitting: *Deleted

## 2023-01-22 ENCOUNTER — Other Ambulatory Visit (HOSPITAL_BASED_OUTPATIENT_CLINIC_OR_DEPARTMENT_OTHER): Payer: Self-pay

## 2023-01-22 ENCOUNTER — Encounter (HOSPITAL_BASED_OUTPATIENT_CLINIC_OR_DEPARTMENT_OTHER): Payer: Self-pay | Admitting: Family Medicine

## 2023-01-22 MED ORDER — BUPROPION HCL ER (XL) 150 MG PO TB24: 150.0000 mg | ORAL_TABLET | Freq: Every day | ORAL | 1 refills | Status: DC

## 2023-02-03 ENCOUNTER — Other Ambulatory Visit (HOSPITAL_BASED_OUTPATIENT_CLINIC_OR_DEPARTMENT_OTHER): Payer: Self-pay | Admitting: Family Medicine

## 2023-02-03 DIAGNOSIS — F900 Attention-deficit hyperactivity disorder, predominantly inattentive type: Secondary | ICD-10-CM

## 2023-02-04 MED ORDER — METHYLPHENIDATE HCL ER (OSM) 27 MG PO TBCR
27.0000 mg | EXTENDED_RELEASE_TABLET | Freq: Every day | ORAL | 0 refills | Status: DC
Start: 2023-02-04 — End: 2023-03-17

## 2023-02-04 NOTE — Telephone Encounter (Signed)
PDMP reviewed, no red flags present. Refill order placed.

## 2023-02-05 ENCOUNTER — Encounter (HOSPITAL_BASED_OUTPATIENT_CLINIC_OR_DEPARTMENT_OTHER): Payer: Self-pay | Admitting: Family Medicine

## 2023-02-13 ENCOUNTER — Other Ambulatory Visit (HOSPITAL_BASED_OUTPATIENT_CLINIC_OR_DEPARTMENT_OTHER): Payer: Self-pay | Admitting: Family Medicine

## 2023-02-16 ENCOUNTER — Ambulatory Visit (HOSPITAL_BASED_OUTPATIENT_CLINIC_OR_DEPARTMENT_OTHER): Payer: Managed Care, Other (non HMO) | Admitting: Family Medicine

## 2023-02-17 ENCOUNTER — Ambulatory Visit (HOSPITAL_BASED_OUTPATIENT_CLINIC_OR_DEPARTMENT_OTHER): Payer: Managed Care, Other (non HMO) | Admitting: Family Medicine

## 2023-02-25 ENCOUNTER — Ambulatory Visit (HOSPITAL_BASED_OUTPATIENT_CLINIC_OR_DEPARTMENT_OTHER): Payer: Managed Care, Other (non HMO) | Admitting: Family Medicine

## 2023-03-09 ENCOUNTER — Encounter (HOSPITAL_COMMUNITY): Payer: Self-pay

## 2023-03-09 ENCOUNTER — Ambulatory Visit (INDEPENDENT_AMBULATORY_CARE_PROVIDER_SITE_OTHER): Payer: 59 | Admitting: Licensed Clinical Social Worker

## 2023-03-09 DIAGNOSIS — F419 Anxiety disorder, unspecified: Secondary | ICD-10-CM | POA: Diagnosis not present

## 2023-03-09 DIAGNOSIS — F339 Major depressive disorder, recurrent, unspecified: Secondary | ICD-10-CM | POA: Diagnosis not present

## 2023-03-09 DIAGNOSIS — F900 Attention-deficit hyperactivity disorder, predominantly inattentive type: Secondary | ICD-10-CM

## 2023-03-09 NOTE — Progress Notes (Unsigned)
Comprehensive Clinical Assessment (CCA) Note  03/09/2023 Joel Wallace [id number]  Chief Complaint:  Chief Complaint  Patient presents with   Anxiety   Depression   ADHD    Dx as an adult. Sxs presented throughout childhood.   Visit Diagnosis: Attention deficit hyperactivity disorder (ADHD), predominantly inattentive type  Anxiety  Depression, recurrent (HCC)   Summary: Antorio is a 42 year old African-American male, with past psych history of Anxiety, MDD, and ADHD, presenting for initial intake CCA in order to establish care for continued treatment in the management of depressive and anxious symptoms, referred by PCP whom is currently managing pt medications.  Stresses include family conflict, grief/loss, medical/physical illnesses/complications, financial strain, workplace stress, and romantic relationship challenges surrounding spouses prior infidelity and learning of oldest son not being patient's biological child.  Symptoms include racing thoughts, over thinking, increased worry, sleep disturbances, challenges focusing, increased irritability, fatigue, hopelessness, worthlessness, significant weight loss, decreased intimacy/arousal, and anhedonia.  Patient denies current and history of SI, HI, AVH. Pt denies current substance use concerns. Pt reports prior hx of OPT for 62mo between 2021-2022, and no prior INPT. Pt will benefit from continued engagement in OPT services in conjunction with med man in efforts to manage and/or ameliorate presenting MH sxs.     03/09/2023    4:06 PM 01/05/2023    8:43 AM 12/08/2022    8:18 AM 11/10/2022    1:23 PM  GAD 7 : Generalized Anxiety Score  Nervous, Anxious, on Edge 3 3 3 2   Control/stop worrying 3 3 3 2   Worry too much - different things 3 3 3 3   Trouble relaxing 3 3 3 3   Restless 3 3 3 3   Easily annoyed or irritable 2 3 3 2   Afraid - awful might happen 3 2 2 2   Total GAD 7 Score 20 20 20 17   Anxiety Difficulty Very difficult Very  difficult Very difficult Somewhat difficult      03/09/2023    4:07 PM 01/05/2023    8:44 AM 12/08/2022    8:18 AM 11/10/2022    1:23 PM 04/10/2021    4:02 PM  Depression screen PHQ 2/9  Decreased Interest 2 2 2 2 2   Down, Depressed, Hopeless 2 2 2 2 2   PHQ - 2 Score 4 4 4 4 4   Altered sleeping 3 3 3 3 3   Tired, decreased energy 3 3 3 3 3   Change in appetite 3 3 3 3 2   Feeling bad or failure about yourself  3 3 2 2 2   Trouble concentrating 3 3 3 3 2   Moving slowly or fidgety/restless 3 2 2 2 2   Suicidal thoughts 0 0 0 0 1  PHQ-9 Score 22 21 20 20 19   Difficult doing work/chores Very difficult Very difficult Very difficult Somewhat difficult    Flowsheet Row Counselor from 03/09/2023 in [facility] at [facility] Visit from 04/10/2021 in [facility] from 03/28/2021 in [facility]  C-SSRS RISK CATEGORY No Risk Error: Q3, 4, or 5 should not be populated when Q2 is No Low Risk      CCA Biopsychosocial Intake/Chief Complaint:  "The anxiety is probably the biggest one, my current doctor had put me on medication for the anxiety and ADHD but neither seems to doing much so she suggested we do therapy first""  Current Symptoms/Problems: "My mind won't turn off, throughout the day, it gets really bad at night  while I'm tyring to sleep which is why I don't really sleep. I have trouble focusing on things. I worry about a lot of stuff, which keeps me up at night also, sometimes gets real frustrating, affects me at work, and my mood sometimes gets real irritable"   Patient Reported Schizophrenia/Schizoaffective Diagnosis in Past: No   Strengths: "I pick up on things fast, I like to read, pick up different skills really fast, good at problem solving, analytical"  Preferences: Prefer virtual therapy and continued med man.  Abilities: "Higher education careers adviser, analytical"   Type of  Services Patient Feels are Needed: Individual therapy and continued med man.   Initial Clinical Notes/Concerns: Pt is a 42yo African American male, presenting for initial intake to establish therapy services referred by PCP. Pt reports prior hx of OPT services in 2021-2022, monthly for approx. 6 months, transferred to [facility] for approx. 3 mo. No prior INPT.   Mental Health Symptoms Depression:  Change in energy/activity; Difficulty Concentrating; Fatigue; Hopelessness; Worthlessness; Weight gain/loss; Sleep (too much or little); Irritability; Increase/decrease in appetite   Duration of Depressive symptoms: Greater than two weeks   Mania:  None   Anxiety:   Difficulty concentrating; Fatigue; Irritability; Restlessness; Sleep; Tension; Worrying   Psychosis:  None   Duration of Psychotic symptoms: No data recorded  Trauma:  Avoids reminders of event; Detachment from others; Difficulty staying/falling asleep; Guilt/shame; Irritability/anger   Obsessions:  None   Compulsions:  None   Inattention:  Disorganized; Symptoms before age 93; Symptoms present in 2 or more settings   Hyperactivity/Impulsivity:  Always on the go; Blurts out answers; Difficulty waiting turn; Feeling of restlessness; Fidgets with hands/feet; Hard time playing/leisure activities quietly; Symptoms present before age 67; Several symptoms present in 2 of more settings; Talks excessively   Oppositional/Defiant Behaviors:  Argumentative; Easily annoyed   Emotional Irregularity:  No data recorded  Other Mood/Personality Symptoms:  No data recorded   Mental Status Exam Appearance and self-care  Stature:  Average   Weight:  Obese   Clothing:  Casual; Neat/clean; Age-appropriate   Grooming:  Normal   Cosmetic use:  None   Posture/gait:  No data recorded  Motor activity:  No data recorded  Sensorium  Attention:  Normal   Concentration:  Normal   Orientation:  X5   Recall/memory:  Normal   Affect and Mood   Affect:  Congruent; Appropriate   Mood:  Euthymic   Relating  Eye contact:  Normal   Facial expression:  Responsive   Attitude toward examiner:  Cooperative   Thought and Language  Speech flow: Clear and Coherent; Normal   Thought content:  Appropriate to Mood and Circumstances   Preoccupation:  None   Hallucinations:  None   Organization:  No data recorded  [employer] of Knowledge:  Average   Intelligence:  Average   Abstraction:  Normal   Judgement:  Normal   Reality Testing:  Adequate   Insight:  Good   Decision Making:  Normal   Social Functioning  Social Maturity:  Responsible   Social Judgement:  Normal   Stress  Stressors:  Family conflict; Grief/losses; Housing; Illness; Financial; Relationship; Work   Coping Ability:  Human resources officer Deficits:  Building services engineer; Interpersonal; Self-care   Supports:  Church; Family; Friends/Service system     Religion: Religion/Spirituality Are You A Religious Person?: Yes What is Your Religious Affiliation?: Christian How Might This Affect Treatment?: "Sometimes I get encouraging words from my  uncle and aunt, they're both pastors. 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 Quit in 12/2022." Do You Have Any Trouble Sleeping?: Yes Explanation of Sleeping Difficulties: "Trouble getting  to sleep, staying asleep. 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 I feel it's because I haven't slept" What is the Longest Time Patient has Held a Job?: 5 years Where was the Patient Employed at that Time?: Current place of employment Has Patient ever Been in the [employer]?: No  Education: Education Is Patient Currently Attending School?: No Last Grade Completed: 12 Name of High School: [employer] Did Garment/textile technologist From [employer]?: Yes Did You Attend College?: Yes What Type of College Degree Do you Have?: Attended 3x. Changed majors/discontinued programs due to life stressors and responsibilities. Did You Attend Graduate School?: No Did You Have An Individualized Education Program (IIEP): No Did You Have Any Difficulty At School?: Yes Were Any Medications Ever Prescribed For These Difficulties?: No Patient's Education Has Been Impacted by Current Illness: No   CCA Family/Childhood History Family and Relationship History: Family history Marital status: Married Number of Years Married: 18 What types of issues is patient dealing with in the relationship?: "Infidelity on her part a couple years ago. Right before that she told me that my oldest son wasn't mine. That's when I first went into a deep depression in early 2022. I over think and analyze everything and has caused stress over the years" Are you sexually active?:  Yes What is your sexual orientation?: "Heterosexual" Has your sexual activity been affected by drugs, alcohol, medication, or emotional stress?: "The medication caused ED and my doctor changed it, but sometimes it's better and sometimes it's not. It's sometimes the medicine and sometimes the emotional stress" Does patient have children?: Yes How many children?: 4 (20, 17, 12, 11yo sons) How is patient's relationship with their children?: "For the most part, my relationship is good. With the oldest it's starting to repair, when my wife told me I wasn't his father, she also told him which caused a lot of rebelion between he and I. For the most part it's really good"  Childhood History:  Childhood History By whom was/is the patient raised?: Both parents Description of patient's relationship with caregiver when they were a child: "It was awsome, they were great, really supportive, because I had issues concentrating  I was always busy" Patient's description of current relationship with people who raised him/her: "It's really good. A whole lot better than even then when I was a kid" How were you disciplined when you got in trouble as a child/adolescent?: "Kind of a mixture, sometimes it was punishment, taking away things, rarely I would get spanked" Does patient have siblings?: Yes Number of Siblings: 1 (38yo sister) Description of patient's current relationship with siblings: "Awesome" Did patient suffer any verbal/emotional/physical/sexual abuse as a child?: No Did patient suffer from severe childhood neglect?: No Has patient ever been sexually abused/assaulted/raped as an adolescent or adult?: No Was the patient ever a victim of a crime or a disaster?: No Witnessed domestic violence?: No Has patient been affected by domestic violence as an adult?: No  CCA Substance Use Alcohol/Drug Use: Alcohol / Drug Use History of alcohol / drug use?: No history of alcohol / drug abuse   Recommendations for  Services/Supports/Treatments: Recommendations for Services/Supports/Treatments Recommendations For Services/Supports/Treatments: Medication Management, Individual Therapy  DSM5 Diagnoses: Patient Active Problem List   Diagnosis Date Noted   Morbid obesity (HCC) 01/05/2023   Attention deficit hyperactivity disorder (ADHD), predominantly inattentive type 12/08/2022   Anxiety 12/08/2022   Depression, recurrent (HCC) 12/08/2022   Hypogonadism in male 02/27/2016   Low testosterone in male 12/17/2015   Essential hypertension 12/17/2015    Patient Centered Plan: Patient is on the following Treatment Plan(s):  Anxiety and Depression   Collaboration of Care: Psychiatrist AEB Referred to psychiatrist to explore potential medication adjustments/recommendations.  Patient/Guardian was advised Release of Information must be obtained prior to any record release in order to collaborate their care with an outside provider. Patient/Guardian was advised if they have not already done so to contact the registration department to sign all necessary forms in order for [place] to release information regarding their care.   Consent: Patient/Guardian gives verbal consent for treatment and assignment of benefits for services provided during this visit. Patient/Guardian expressed understanding and agreed to proceed.   Leisa Lenz, LCSW

## 2023-03-16 ENCOUNTER — Ambulatory Visit (HOSPITAL_BASED_OUTPATIENT_CLINIC_OR_DEPARTMENT_OTHER): Payer: Managed Care, Other (non HMO) | Admitting: Family Medicine

## 2023-03-17 ENCOUNTER — Encounter (HOSPITAL_BASED_OUTPATIENT_CLINIC_OR_DEPARTMENT_OTHER): Payer: Self-pay | Admitting: Family Medicine

## 2023-03-17 ENCOUNTER — Ambulatory Visit (HOSPITAL_BASED_OUTPATIENT_CLINIC_OR_DEPARTMENT_OTHER): Payer: Managed Care, Other (non HMO) | Admitting: Family Medicine

## 2023-03-17 VITALS — BP 134/97 | HR 91 | Ht 70.0 in | Wt 338.7 lb

## 2023-03-17 DIAGNOSIS — F331 Major depressive disorder, recurrent, moderate: Secondary | ICD-10-CM | POA: Diagnosis not present

## 2023-03-17 DIAGNOSIS — F900 Attention-deficit hyperactivity disorder, predominantly inattentive type: Secondary | ICD-10-CM

## 2023-03-17 MED ORDER — METHYLPHENIDATE HCL ER (OSM) 36 MG PO TBCR
36.0000 mg | EXTENDED_RELEASE_TABLET | ORAL | 0 refills | Status: DC
Start: 2023-03-17 — End: 2023-04-21

## 2023-03-17 MED ORDER — BUPROPION HCL ER (XL) 300 MG PO TB24: 300.0000 mg | ORAL_TABLET | Freq: Every day | ORAL | 3 refills | Status: AC

## 2023-03-17 NOTE — Progress Notes (Signed)
 Established Patient Office Visit  Subjective  Patient ID: Joel Wallace., male    DOB: [birth year]  Age: 42 y.o. MRN: [id number]  Chief Complaint  Patient presents with   Anxiety   Depression   MOOD: Joel Wallace. Is a 42 year old male patient who presents for the medical management of depression.  Current medication regimen: Zoloft  150mg  & Wellbutrin  150mg  daily Only took both medications for 2 days- pharmacist told him he should not be taking medications together (?) and so patient stopped taking Zoloft . He is currently only taking Wellbutrin  150mg . I feel like the Wellbutrin  is working but not working all day [place], he feels like they wear off   He is currently taking Concerta  27mg  daily and would like to increase to 36mg - which is the dose he has been on in the past  Denies SI/HI.     03/17/2023    4:07 PM 03/09/2023    4:06 PM 01/05/2023    8:43 AM 12/08/2022    8:18 AM  GAD 7 : Generalized Anxiety Score  Nervous, Anxious, on Edge 2  3 3   Control/stop worrying 3  3 3   Worry too much - different things 3  3 3   Trouble relaxing 2  3 3   Restless 2  3 3   Easily annoyed or irritable 2  3 3   Afraid - awful might happen 2  2 2   Total GAD 7 Score 16  20 20   Anxiety Difficulty Very difficult  Very difficult Very difficult     Information is confidential and restricted. Go to Review Flowsheets to unlock data.      03/17/2023    4:05 PM 03/09/2023    4:07 PM 01/05/2023    8:44 AM  PHQ9 SCORE ONLY  PHQ-9 Total Score 14  21     Information is confidential and restricted. Go to Review Flowsheets to unlock data.    ROS: see HPI   Objective:     BP (!) 134/97   Pulse 91   Ht 5' 10 (1.778 m)   Wt (!) 338 lb 11.2 oz (153.6 kg)   BMI 48.60 kg/m  BP Readings from Last 3 Encounters:  03/17/23 (!) 134/97  01/05/23 (!) 139/99  12/08/22 (!) 131/91     Physical Exam Vitals reviewed.  Constitutional:      Appearance: Normal appearance.   Cardiovascular:     Rate and Rhythm: Normal rate and regular rhythm.     Pulses: Normal pulses.     Heart sounds: Normal heart sounds.  Pulmonary:     Effort: Pulmonary effort is normal.     Breath sounds: Normal breath sounds.  Neurological:     Mental Status: He is alert.  Psychiatric:        Mood and Affect: Mood normal.        Behavior: Behavior normal.     Assessment & Plan:  Major depressive disorder, recurrent episode, moderate (HCC) Assessment & Plan: Patient presents today for mood follow-up. He reports that he stopped taking Zoloft  150mg  daily and has been only taking Wellbutrin  150mg  daily. PHQ9 completed with score of 14- with improvement from previous scores. Denies SI/HI. Advised patient concerns of stopping SSRIs without weaning off. Patient seems he tolerated this and advised him to stop taking Zoloft  altogether at this time. Discussed increasing Wellbutrin  XL from 150mg  to 300mg  daily. Discussed GeneSight testing with patient for optimal pharmacotherapy to help treat his depression. Patient is currently involved in counseling services-  who referred him to psychiatry. Patient had appt prior to holidays but had to cancel. Advised patient to reach back out and schedule with specialist.   Orders: -     buPROPion  HCl ER (XL); Take 1 tablet (300 mg total) by mouth daily.  Dispense: 90 tablet; Refill: 3  Attention deficit hyperactivity disorder (ADHD), predominantly inattentive type Assessment & Plan: Patient is currently taking Concerta  27mg  daily and would like to increase to 36mg  daily, as he was previously on this dose and tolerated it well and had adequate symptom control. PDMP reviewed, no red flags present. Sent prescription for 36mg  daily. Advised patient to follow-up in 3 months.  Orders: -     Methylphenidate  HCl ER (OSM); Take 1 tablet (36 mg total) by mouth every morning.  Dispense: 30 tablet; Refill: 0    Return in about 3 months (around 06/15/2023) for Mood &  ADHD f/u.   Spent 32 minutes on this patient encounter, including preparation, chart review, face-to-face counseling with patient and coordination of care, and documentation of encounter.    Evalene Arts, FNP

## 2023-03-19 NOTE — Assessment & Plan Note (Signed)
 Patient is currently taking Concerta  27mg  daily and would like to increase to 36mg  daily, as he was previously on this dose and tolerated it well and had adequate symptom control. PDMP reviewed, no red flags present. Sent prescription for 36mg  daily. Advised patient to follow-up in 3 months.

## 2023-03-19 NOTE — Assessment & Plan Note (Signed)
 Patient presents today for mood follow-up. He reports that he stopped taking Zoloft  150mg  daily and has been only taking Wellbutrin  150mg  daily. PHQ9 completed with score of 14- with improvement from previous scores. Denies SI/HI. Advised patient concerns of stopping SSRIs without weaning off. Patient seems he tolerated this and advised him to stop taking Zoloft  altogether at this time. Discussed increasing Wellbutrin  XL from 150mg  to 300mg  daily. Discussed GeneSight testing with patient for optimal pharmacotherapy to help treat his depression. Patient is currently involved in counseling services- who referred him to psychiatry. Patient had appt prior to holidays but had to cancel. Advised patient to reach back out and schedule with specialist.

## 2023-03-24 ENCOUNTER — Other Ambulatory Visit (HOSPITAL_BASED_OUTPATIENT_CLINIC_OR_DEPARTMENT_OTHER): Payer: Self-pay | Admitting: Family Medicine

## 2023-03-24 ENCOUNTER — Encounter (HOSPITAL_BASED_OUTPATIENT_CLINIC_OR_DEPARTMENT_OTHER): Payer: Self-pay | Admitting: Family Medicine

## 2023-03-24 NOTE — Progress Notes (Unsigned)
 Joel Wallace

## 2023-03-25 ENCOUNTER — Ambulatory Visit (HOSPITAL_COMMUNITY): Payer: 59 | Admitting: Licensed Clinical Social Worker

## 2023-04-09 ENCOUNTER — Ambulatory Visit (HOSPITAL_COMMUNITY): Payer: 59 | Admitting: Licensed Clinical Social Worker

## 2023-04-21 ENCOUNTER — Other Ambulatory Visit (HOSPITAL_BASED_OUTPATIENT_CLINIC_OR_DEPARTMENT_OTHER): Payer: Self-pay | Admitting: Family Medicine

## 2023-04-21 DIAGNOSIS — F900 Attention-deficit hyperactivity disorder, predominantly inattentive type: Secondary | ICD-10-CM

## 2023-04-22 MED ORDER — METHYLPHENIDATE HCL ER (OSM) 36 MG PO TBCR
36.0000 mg | EXTENDED_RELEASE_TABLET | ORAL | 0 refills | Status: DC
Start: 2023-04-22 — End: 2023-06-15

## 2023-04-23 ENCOUNTER — Ambulatory Visit (HOSPITAL_COMMUNITY): Payer: 59 | Admitting: Licensed Clinical Social Worker

## 2023-05-07 ENCOUNTER — Ambulatory Visit (HOSPITAL_COMMUNITY): Payer: 59 | Admitting: Licensed Clinical Social Worker

## 2023-05-21 ENCOUNTER — Ambulatory Visit (HOSPITAL_COMMUNITY): Payer: 59 | Admitting: Licensed Clinical Social Worker

## 2023-06-04 ENCOUNTER — Ambulatory Visit (HOSPITAL_COMMUNITY): Payer: 59 | Admitting: Licensed Clinical Social Worker

## 2023-06-15 ENCOUNTER — Encounter (HOSPITAL_BASED_OUTPATIENT_CLINIC_OR_DEPARTMENT_OTHER): Payer: Self-pay | Admitting: Family Medicine

## 2023-06-15 ENCOUNTER — Ambulatory Visit (HOSPITAL_BASED_OUTPATIENT_CLINIC_OR_DEPARTMENT_OTHER): Payer: Managed Care, Other (non HMO) | Admitting: Family Medicine

## 2023-06-15 ENCOUNTER — Encounter (INDEPENDENT_AMBULATORY_CARE_PROVIDER_SITE_OTHER): Payer: Self-pay

## 2023-06-15 DIAGNOSIS — R7303 Prediabetes: Secondary | ICD-10-CM | POA: Insufficient documentation

## 2023-06-15 DIAGNOSIS — F339 Major depressive disorder, recurrent, unspecified: Secondary | ICD-10-CM

## 2023-06-15 DIAGNOSIS — I10 Essential (primary) hypertension: Secondary | ICD-10-CM

## 2023-06-15 DIAGNOSIS — F900 Attention-deficit hyperactivity disorder, predominantly inattentive type: Secondary | ICD-10-CM

## 2023-06-15 LAB — POCT GLYCOSYLATED HEMOGLOBIN (HGB A1C)
HbA1c, POC (controlled diabetic range): 6.1 % (ref 0.0–7.0)
HbA1c, POC (prediabetic range): 6.1 % (ref 5.7–6.4)

## 2023-06-15 LAB — POCT URINE DRUG SCREEN
Morphine: NOT DETECTED
POC BENZODIAZEPINES UR: NOT DETECTED
POC Cocaine UR: NOT DETECTED
POC Marijuana UR: NOT DETECTED
POC Oxycodone UR: NOT DETECTED

## 2023-06-15 MED ORDER — METOPROLOL SUCCINATE ER 25 MG PO TB24: 25.0000 mg | ORAL_TABLET | Freq: Every day | ORAL | 3 refills | Status: DC

## 2023-06-15 MED ORDER — AMPHETAMINE-DEXTROAMPHET ER 10 MG PO CP24: 10.0000 mg | ORAL_CAPSULE | Freq: Every day | ORAL | 0 refills | Status: DC

## 2023-06-15 NOTE — Progress Notes (Signed)
 Subjective:   Joel Wallace. 08-Dec-1980 06/15/2023  Chief Complaint  Patient presents with   Medical Management of Chronic Issues    Pt is here following up for medication management.  Does have concerns about his weight and also of some of the medications he is currently taking.    HPI: Joel Wallace. presents today for re-assessment and management of chronic medical conditions.  HYPERTENSION: Joel Cure. presents for the medical management of hypertension.  Patient's current hypertension medication regimen is: Amlodipine 10mg , Valsaratan-hydrochlorothiazide 160/25 Patient is  currently taking prescribed medications for HTN.  Patient is not regularly keeping a check on BP at home. Reports checking in store is usually around 140/80's.  Adhering to low sodium diet: Yes Exercising Regularly: No Denies headache, dizziness, CP, SHOB, vision changes.   BP Readings from Last 3 Encounters:  06/15/23 (!) 150/104  03/17/23 (!) 134/97  01/05/23 (!) 139/99     IMPAIRED FASTING GLUCOSE Joel Cure. is here for medical management of impaired fasting glucose.  Patient's current IFG medication regimen is: Diet, exercise Adhering to a diabetic diet: Yes Exercising Regularly: Yes, he exercises frequently  Checking Blood Sugars: No  Denies polydipsia, polyphagia, polyuria.  Lab Results  Component Value Date   HGBA1C 6.1 06/15/2023   HGBA1C 6.1 06/15/2023    ADD/ADHD EVALUATION Joel Cure. presents for the evaluation of ADD/ADHD. Patient reports hx of adhd controlled and best with adderall. He states the concerta was not helping focus. He has not filled since January per PDMP.  Work/school performance:   Difficulty with completion of tasks; stay focused Difficulty sustaining attention/completing tasks: yes Distracted by extraneous stimuli: yes Does not listen when spoken to: no  Fidgets with hands or feet: no Unable to stay in seat: no Blurts  out/interrupts others: no  Previous psychiatry evaluation: yes Previous medications: yes adderall XR, concerta, daytrana (methylphenidate), vyvanse (lisdexamfethamine), and wellbutrin    DEPRESSION: Joel Cure. presents for the medical management of depression.  Current medication regimen: Wellbutrin 300mg  XR Counseling: He does see counseling regularly with [facility] controlled: Yes currently  Denies SI/HI.     06/15/2023    3:52 PM 03/17/2023    4:05 PM 03/09/2023    4:07 PM 01/05/2023    8:44 AM 12/08/2022    8:18 AM  Depression screen PHQ 2/9  Decreased Interest 1 1  2 2   Down, Depressed, Hopeless 1 1  2 2   PHQ - 2 Score 2 2  4 4   Altered sleeping 1 2  3 3   Tired, decreased energy 1 2  3 3   Change in appetite 1 2  3 3   Feeling bad or failure about yourself  1 2  3 2   Trouble concentrating 1 2  3 3   Moving slowly or fidgety/restless 0 2  2 2   Suicidal thoughts 0 0  0 0  PHQ-9 Score 7 14  21 20   Difficult doing work/chores Very difficult Very difficult  Very difficult Very difficult     Information is confidential and restricted. Go to Review Flowsheets to unlock data.      06/15/2023    3:53 PM 03/17/2023    4:07 PM 03/09/2023    4:06 PM 01/05/2023    8:43 AM  GAD 7 : Generalized Anxiety Score  Nervous, Anxious, on Edge 1 2  3   Control/stop worrying 1 3  3   Worry too much - different things 1 3  3   Trouble  relaxing 1 2  3   Restless 1 2  3   Easily annoyed or irritable 1 2  3   Afraid - awful might happen 1 2  2   Total GAD 7 Score 7 16  20   Anxiety Difficulty Very difficult Very difficult  Very difficult     Information is confidential and restricted. Go to Review Flowsheets to unlock data.         The following portions of the patient's history were reviewed and updated as appropriate: past medical history, past surgical history, family history, social history, allergies, medications, and problem list.   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   Anxiety 12/08/2022   Depression, recurrent (HCC) 12/08/2022   Hypogonadism in male 02/27/2016   Low testosterone in male 12/17/2015   Essential hypertension 12/17/2015   Past Medical History:  Diagnosis Date   ADHD (attention deficit hyperactivity disorder)    diagnosed 2012 by psychiatry, [facility]   Allergy    Anxiety    Asthma    Depression    DVT (deep venous thrombosis) (HCC)    Gastric ulcer    GERD (gastroesophageal reflux disease)    History of DVT (deep vein thrombosis) 12/17/2015   Hypertension    Low testosterone    Migraine    Sleep apnea    Wears glasses    Past Surgical History:  Procedure Laterality Date   COLONOSCOPY  03/17/2005   ESOPHAGOGASTRODUODENOSCOPY  03/17/2005   Family History  Problem Relation Age of Onset   Asthma Mother    Ulcers Father    Cancer Paternal Aunt    ADD / ADHD Paternal Aunt    Cancer Paternal Grandmother    Clotting disorder Paternal Grandmother    Clotting disorder Maternal Uncle    Clotting disorder Cousin    Heart disease Neg Hx    Stroke Neg Hx    Outpatient Medications Prior to Visit  Medication Sig Dispense Refill   amLODipine (NORVASC) 10 MG tablet Take 1 tablet (10 mg total) by mouth daily. 90 tablet 3   Blood Pressure Monitoring (BLOOD PRESSURE CUFF) MISC 1 each by Does not apply route 2 (two) times daily as needed. 1 each 0   buPROPion (WELLBUTRIN XL) 300 MG 24 hr tablet Take 1 tablet (300 mg total) by mouth daily. 90 tablet 3   valsartan-hydrochlorothiazide (DIOVAN-HCT) 160-25 MG tablet Take 1 tablet by mouth daily. 90 tablet 3   methylphenidate (CONCERTA) 36 MG PO CR tablet Take 1 tablet (36 mg total) by mouth every morning. 30 tablet 0   No facility-administered medications prior to visit.   No Known Allergies   ROS: A complete ROS  was performed with pertinent positives/negatives noted in the HPI. The remainder of the ROS are negative.    Objective:   Today's Vitals   06/15/23 1550 06/15/23 1615  BP: (!) 149/104 (!) 150/104  Pulse: (!) 122 (!) 108  SpO2: 96%   Weight: (!) 346 lb 4.8 oz (157.1 kg)   Height: 5\' 10"  (1.778 m)     Physical Exam          GENERAL: Well-appearing, in NAD. Well nourished.  SKIN: Pink, warm and dry.  Head: Normocephalic. NECK: Trachea midline. Full ROM w/o pain or tenderness.  RESPIRATORY: Chest wall symmetrical. Respirations even and non-labored. Breath sounds clear to auscultation bilaterally.  CARDIAC:  S1, S2 present, tachycardic rate and rhythm without murmur or gallops. Peripheral pulses 2+ bilaterally.  MSK: Muscle tone and strength appropriate for age.  NEUROLOGIC: No motor or sensory deficits. Steady, even gait. C2-C12 intact.  PSYCH/MENTAL STATUS: Alert, oriented x 3. Cooperative, appropriate mood and affect.   Health Maintenance Due  Topic Date Due   Hepatitis C Screening  Never done    Results for orders placed or performed in visit on 06/15/23  POCT glycosylated hemoglobin (Hb A1C)  Result Value Ref Range   Hemoglobin A1C     HbA1c POC (<> result, manual entry)     HbA1c, POC (prediabetic range) 6.1 5.7 - 6.4 %   HbA1c, POC (controlled diabetic range) 6.1 0.0 - 7.0 %  POCT Urine Drug Screen  Result Value Ref Range   POC Methamphetamine UR     POC Opiate Ur     POC Barbiturate UR     POC Amphetamine UR     POC Oxycodone UR None Detected None Detected   POC Cocaine UR None Detected None Detected   POC Ecstasy UR     POC TRICYCLICS UR     POC PHENCYCLIDINE UR     POC Marijuana UR None Detected None Detected   POC Methadone UR     POC BENZODIAZEPINES UR None Detected None Detected   URINE TEMPERATURE     POC DRUG SCREEN OXIDANTS URINE     POC SPECIFIC GRAVITY URINE     POC PH URINE     Methylenedioxyamphetamine       Morphine none detected     The  ASCVD Risk score (Arnett DK, et al., 2019) failed to calculate for the following reasons:   Cannot find a previous HDL lab   Cannot find a previous total cholesterol lab     Assessment & Plan:  1. Morbid obesity (HCC) (Primary) 2. Prediabetes Patient's A1c was improved at 6.1.  Prior was 6.3.  He has made dietary changes and is active as a [employer].  I did recommend consult with medical weight management and patient was agreeable.  We discussed options of metformin as well for use of prediabetes, patient declined at this time but will let PCP know if he changes his mind.  Discussed prediabetes eating plan as well.  GLP-1's not covered by insurance - POCT glycosylated hemoglobin (Hb A1C) - Amb Ref to Medical Weight Management   3. Essential hypertension Uncontrolled.  Continue current regimen and add on metoprolol 25 mg XR daily given sinus tachycardia.  EKG reviewed.  We discussed possible side effects of medication and patient verbalized understanding.  Recommend patient obtain large adult arm cuff to track blood pressure at home and return in 2 weeks for blood pressure and fasting lab work. - metoprolol succinate (TOPROL-XL) 25 MG 24 hr tablet; Take 1 tablet (25 mg total) by mouth daily.  Dispense: 30 tablet; Refill: 3  4. Depression, recurrent (HCC) Controlled and improved with Wellbutrin.  Continue current regimen.  Safety plan reviewed  5. Attention deficit hyperactivity disorder (ADHD), predominantly inattentive type Uncontrolled.  We discussed that stimulant medication may also be contributing to uncontrolled hypertension.  Patient is agreeable to monitor blood pressure with starting Adderall XR 10 mg.  Safe use of medication reviewed and UDS and controlled substance agreement updated.  Patient no longer taking Concerta. - amphetamine-dextroamphetamine (ADDERALL XR) 10 MG 24 hr capsule; Take 1 capsule (10 mg total) by mouth daily.  Dispense: 30 capsule; Refill: 0 -  POCT Urine Drug  Screen   Meds ordered this encounter  Medications   amphetamine-dextroamphetamine (ADDERALL XR) 10 MG 24 hr capsule    Sig: Take 1 capsule (10 mg total) by mouth daily.    Dispense:  30 capsule    Refill:  0    Supervising Provider:   DE Peru, RAYMOND J [[id number]]   metoprolol succinate (TOPROL-XL) 25 MG 24 hr tablet    Sig: Take 1 tablet (25 mg total) by mouth daily.    Dispense:  30 tablet    Refill:  3    Supervising Provider:   DE [place], RAYMOND J [[id number]]   Lab Orders         POCT glycosylated hemoglobin (Hb A1C)         POCT Urine Drug Screen     No images are attached to the encounter or orders placed in the encounter.  Return in about 2 weeks (around 06/29/2023) for 2 weeks BP Check and fasting labs; 3 month follow up in office for chronic condition.    Patient to reach out to office if new, worrisome, or unresolved symptoms arise or if no improvement in patient's condition. Patient verbalized understanding and is agreeable to treatment plan. All questions answered to patient's satisfaction.    Hilbert Bible, [place]

## 2023-06-29 ENCOUNTER — Encounter (HOSPITAL_BASED_OUTPATIENT_CLINIC_OR_DEPARTMENT_OTHER): Payer: Self-pay | Admitting: Family Medicine

## 2023-06-29 ENCOUNTER — Other Ambulatory Visit (HOSPITAL_BASED_OUTPATIENT_CLINIC_OR_DEPARTMENT_OTHER): Payer: Self-pay | Admitting: Family Medicine

## 2023-06-29 ENCOUNTER — Ambulatory Visit (HOSPITAL_BASED_OUTPATIENT_CLINIC_OR_DEPARTMENT_OTHER)

## 2023-06-29 NOTE — Progress Notes (Signed)
 Patient is in office today for a nurse visit for Blood Pressure Check. Patient blood pressure was 124/86/ , Patient No chest pain, No shortness of breath, No dyspnea on exertion, No orthopnea, No paroxysmal nocturnal dyspnea, No edema, No palpitations, No syncope

## 2023-06-30 ENCOUNTER — Encounter (HOSPITAL_BASED_OUTPATIENT_CLINIC_OR_DEPARTMENT_OTHER): Payer: Self-pay | Admitting: Family Medicine

## 2023-06-30 LAB — COMPREHENSIVE METABOLIC PANEL WITH GFR
ALT: 23 IU/L (ref 0–44)
AST: 20 IU/L (ref 0–40)
Albumin: 4.4 g/dL (ref 4.1–5.1)
Alkaline Phosphatase: 61 IU/L (ref 44–121)
BUN/Creatinine Ratio: 8 — ABNORMAL LOW (ref 9–20)
BUN: 10 mg/dL (ref 6–24)
Bilirubin Total: 0.3 mg/dL (ref 0.0–1.2)
CO2: 24 mmol/L (ref 20–29)
Calcium: 9.8 mg/dL (ref 8.7–10.2)
Chloride: 102 mmol/L (ref 96–106)
Creatinine, Ser: 1.24 mg/dL (ref 0.76–1.27)
Globulin, Total: 2.6 g/dL (ref 1.5–4.5)
Glucose: 125 mg/dL — ABNORMAL HIGH (ref 70–99)
Potassium: 4.3 mmol/L (ref 3.5–5.2)
Sodium: 142 mmol/L (ref 134–144)
Total Protein: 7 g/dL (ref 6.0–8.5)
eGFR: 75 mL/min/{1.73_m2} (ref >59)

## 2023-06-30 LAB — LIPID PANEL
Chol/HDL Ratio: 4.3 ratio (ref 0.0–5.0)
Cholesterol, Total: 217 mg/dL — ABNORMAL HIGH (ref 100–199)
HDL: 51 mg/dL (ref >39)
LDL Chol Calc (NIH): 144 mg/dL — ABNORMAL HIGH (ref 0–99)
Triglycerides: 124 mg/dL (ref 0–149)
VLDL Cholesterol Cal: 22 mg/dL (ref 5–40)

## 2023-06-30 NOTE — Progress Notes (Signed)
 Joel Wallace,  Your electrolytes, kidney and liver function is normal. Your blood pressure from yesterday's check in office looks much better as well. Your cholesterol is elevated and can increase your risk of heart disease. I would recommend dietary changes such avoid greasy/fatty foods. Adhere to a diet with lean proteins, vegetables, fruits, and low carbohydrates. Drink plenty of water. Regular exercise. We can discuss further at your next appointment.

## 2023-07-01 NOTE — Addendum Note (Signed)
 Addended by: Eve Hinders on: 07/01/2023 10:15 AM   Modules accepted: Orders

## 2023-07-07 ENCOUNTER — Encounter (HOSPITAL_BASED_OUTPATIENT_CLINIC_OR_DEPARTMENT_OTHER): Payer: Self-pay | Admitting: Family Medicine

## 2023-07-07 NOTE — Telephone Encounter (Signed)
 Jon Gills, Please see mychart message sent by pt and advise.

## 2023-07-15 ENCOUNTER — Other Ambulatory Visit (HOSPITAL_BASED_OUTPATIENT_CLINIC_OR_DEPARTMENT_OTHER): Payer: Self-pay | Admitting: Family Medicine

## 2023-07-15 DIAGNOSIS — I10 Essential (primary) hypertension: Secondary | ICD-10-CM

## 2023-07-15 DIAGNOSIS — F900 Attention-deficit hyperactivity disorder, predominantly inattentive type: Secondary | ICD-10-CM

## 2023-07-15 MED ORDER — LISDEXAMFETAMINE DIMESYLATE 20 MG PO CAPS: 20.0000 mg | ORAL_CAPSULE | Freq: Every day | ORAL | 0 refills | Status: DC

## 2023-07-15 MED ORDER — METOPROLOL TARTRATE 25 MG PO TABS: 12.5000 mg | ORAL_TABLET | Freq: Two times a day (BID) | ORAL | 3 refills | Status: DC

## 2023-08-20 ENCOUNTER — Other Ambulatory Visit: Payer: Self-pay | Admitting: Medical Genetics

## 2023-08-21 ENCOUNTER — Encounter (HOSPITAL_BASED_OUTPATIENT_CLINIC_OR_DEPARTMENT_OTHER): Payer: Self-pay | Admitting: Family Medicine

## 2023-08-21 ENCOUNTER — Other Ambulatory Visit (HOSPITAL_BASED_OUTPATIENT_CLINIC_OR_DEPARTMENT_OTHER): Payer: Self-pay | Admitting: Family Medicine

## 2023-08-21 DIAGNOSIS — F900 Attention-deficit hyperactivity disorder, predominantly inattentive type: Secondary | ICD-10-CM

## 2023-08-21 MED ORDER — LISDEXAMFETAMINE DIMESYLATE 30 MG PO CAPS: 30.0000 mg | ORAL_CAPSULE | Freq: Every day | ORAL | 0 refills | Status: DC

## 2023-08-25 ENCOUNTER — Other Ambulatory Visit (HOSPITAL_COMMUNITY)
Admission: RE | Admit: 2023-08-25 | Discharge: 2023-08-25 | Disposition: A | Discharge status: Home / Self Care | Payer: Self-pay | Source: Ambulatory Visit | Attending: Medical Genetics | Admitting: Medical Genetics

## 2023-09-05 ENCOUNTER — Encounter (HOSPITAL_BASED_OUTPATIENT_CLINIC_OR_DEPARTMENT_OTHER): Payer: Self-pay | Admitting: *Deleted

## 2023-09-05 ENCOUNTER — Emergency Department (HOSPITAL_BASED_OUTPATIENT_CLINIC_OR_DEPARTMENT_OTHER): Admission: EM | Admit: 2023-09-05 | Discharge: 2023-09-05 | Disposition: A | Discharge status: Home / Self Care

## 2023-09-05 ENCOUNTER — Emergency Department (HOSPITAL_BASED_OUTPATIENT_CLINIC_OR_DEPARTMENT_OTHER): Admitting: Radiology

## 2023-09-05 ENCOUNTER — Other Ambulatory Visit: Payer: Self-pay

## 2023-09-05 DIAGNOSIS — R0602 Shortness of breath: Secondary | ICD-10-CM | POA: Insufficient documentation

## 2023-09-05 DIAGNOSIS — I10 Essential (primary) hypertension: Secondary | ICD-10-CM | POA: Diagnosis not present

## 2023-09-05 DIAGNOSIS — R079 Chest pain, unspecified: Secondary | ICD-10-CM | POA: Insufficient documentation

## 2023-09-05 DIAGNOSIS — Z79899 Other long term (current) drug therapy: Secondary | ICD-10-CM | POA: Diagnosis not present

## 2023-09-05 LAB — BASIC METABOLIC PANEL WITH GFR
Anion gap: 13 (ref 5–15)
BUN: 14 mg/dL (ref 6–20)
CO2: 25 mmol/L (ref 22–32)
Calcium: 9.5 mg/dL (ref 8.9–10.3)
Chloride: 102 mmol/L (ref 98–111)
Creatinine, Ser: 1.06 mg/dL (ref 0.61–1.24)
GFR, Estimated: >60 mL/min (ref >60)
Glucose, Bld: 100 mg/dL — ABNORMAL HIGH (ref 70–99)
Potassium: 3.5 mmol/L (ref 3.5–5.1)
Sodium: 140 mmol/L (ref 135–145)

## 2023-09-05 LAB — GENECONNECT MOLECULAR SCREEN: Genetic Analysis Overall Interpretation: NEGATIVE

## 2023-09-05 LAB — CBC
HCT: 38.7 % — ABNORMAL LOW (ref 39.0–52.0)
Hemoglobin: 13 g/dL (ref 13.0–17.0)
MCH: 27.4 pg (ref 26.0–34.0)
MCHC: 33.6 g/dL (ref 30.0–36.0)
MCV: 81.5 fL (ref 80.0–100.0)
Platelets: 268 10*3/uL (ref 150–400)
RBC: 4.75 MIL/uL (ref 4.22–5.81)
RDW: 14.1 % (ref 11.5–15.5)
WBC: 6 10*3/uL (ref 4.0–10.5)
nRBC: 0 % (ref 0.0–0.2)

## 2023-09-05 LAB — TROPONIN T, HIGH SENSITIVITY: Troponin T High Sensitivity: <15 ng/L (ref <19)

## 2023-09-05 LAB — D-DIMER, QUANTITATIVE: D-Dimer, Quant: <0.27 ug{FEU}/mL (ref 0.00–0.50)

## 2023-09-05 MED ORDER — PANTOPRAZOLE SODIUM 20 MG PO TBEC: 20.0000 mg | DELAYED_RELEASE_TABLET | Freq: Every day | ORAL | 0 refills | Status: DC

## 2023-09-05 MED ORDER — IBUPROFEN 400 MG PO TABS
400.0000 mg | ORAL_TABLET | Freq: Once | ORAL | Status: AC
  Administered 2023-09-05: 400 mg via ORAL
  Filled 2023-09-05: qty 1

## 2023-09-05 MED ORDER — ACETAMINOPHEN 500 MG PO TABS
1000.0000 mg | ORAL_TABLET | Freq: Once | ORAL | Status: AC
  Administered 2023-09-05: 1000 mg via ORAL
  Filled 2023-09-05: qty 2

## 2023-09-05 MED ORDER — ALUM & MAG HYDROXIDE-SIMETH 200-200-20 MG/5ML PO SUSP
30.0000 mL | Freq: Once | ORAL | Status: AC
  Administered 2023-09-05: 30 mL via ORAL
  Filled 2023-09-05: qty 30

## 2023-09-05 NOTE — ED Provider Notes (Signed)
 [place] EMERGENCY DEPARTMENT AT [facility] Provider Note   CSN: [id number] Arrival date & time: 09/05/23  1341     Patient presents with: Chest Pain   Joel Wallace. is a 43 y.o. male.   43 year old male presenting emergency department with chest pain.  Started 2 days ago when he was running across the football field and has been persistent since that time.  Dull ache.  He also notes that seemingly it is worse sometimes after he eats and when lying flat and thought it was reflux but it has not gone away.  Has not tried any medications to treat his pain or symptoms.  He notes some mild discomfort with deep breathing as well.  No URI symptoms.  Does have a history of DVT not on anticoagulation.   Chest Pain      Prior to Admission medications   Medication Sig Start Date End Date Taking? Authorizing Provider  pantoprazole (PROTONIX) 20 MG tablet Take 1 tablet (20 mg total) by mouth daily for 14 days. 09/05/23 09/19/23 Yes Chinita Schimpf, Caron PARAS, DO  amLODipine  (NORVASC ) 10 MG tablet Take 1 tablet (10 mg total) by mouth daily. 01/05/23   Towana Small, FNP  Blood Pressure Monitoring (BLOOD PRESSURE CUFF) MISC 1 each by Does not apply route 2 (two) times daily as needed. 12/08/22   Towana Small, FNP  buPROPion  (WELLBUTRIN  XL) 300 MG 24 hr tablet Take 1 tablet (300 mg total) by mouth daily. 03/17/23   Butler, Kristina, FNP  lisdexamfetamine (VYVANSE ) 30 MG capsule Take 1 capsule (30 mg total) by mouth daily. 08/21/23   Caudle, Thersia Bitters, FNP  metoprolol  tartrate (LOPRESSOR ) 25 MG tablet Take 0.5 tablets (12.5 mg total) by mouth 2 (two) times daily. 07/15/23   Caudle, Thersia Bitters, FNP  valsartan -hydrochlorothiazide  (DIOVAN -HCT) 160-25 MG tablet Take 1 tablet by mouth daily. 11/10/22   Towana Small, FNP    Allergies: Patient has no known allergies.    Review of Systems  Cardiovascular:  Positive for chest pain.    Updated Vital Signs BP 128/88 (BP Location: Right  Arm)   Pulse 82   Temp 98.8 F (37.1 C) (Oral)   Resp 20   SpO2 98%   Physical Exam Vitals and nursing note reviewed.  Constitutional:      General: He is not in acute distress.    Appearance: He is obese. He is not toxic-appearing.  HENT:     Head: Normocephalic.   Cardiovascular:     Rate and Rhythm: Normal rate and regular rhythm.     Pulses:          Radial pulses are 2+ on the right side and 2+ on the left side.     Heart sounds: Normal heart sounds.     No friction rub.  Pulmonary:     Effort: Pulmonary effort is normal.     Breath sounds: Normal breath sounds. No wheezing, rhonchi or rales.   Musculoskeletal:     Right lower leg: No edema.     Left lower leg: No edema.   Skin:    General: Skin is warm.     Capillary Refill: Capillary refill takes less than 2 seconds.   Neurological:     Mental Status: He is alert and oriented to person, place, and time.   Psychiatric:        Mood and Affect: Mood normal.        Behavior: Behavior normal.     (all labs ordered  are listed, but only abnormal results are displayed) Labs Reviewed  BASIC METABOLIC PANEL WITH GFR - Abnormal; Notable for the following components:      Result Value   Glucose, Bld 100 (*)    All other components within normal limits  CBC - Abnormal; Notable for the following components:   HCT 38.7 (*)    All other components within normal limits  D-DIMER, QUANTITATIVE  TROPONIN T, HIGH SENSITIVITY    EKG: EKG Interpretation Date/Time:  Saturday September 05 2023 13:46:10 EDT Ventricular Rate:  85 PR Interval:  162 QRS Duration:  99 QT Interval:  362 QTC Calculation: 431 R Axis:   64  Text Interpretation: Sinus rhythm Confirmed by Neysa Clap [phone]) on 09/05/2023 3:09:18 PM  Radiology: DG Chest 2 View Result Date: 09/05/2023 CLINICAL DATA:  Chest pain, short of breath EXAM: CHEST - 2 VIEW COMPARISON:  None Available. FINDINGS: Normal mediastinum and cardiac silhouette. Normal pulmonary  vasculature. No evidence of effusion, infiltrate, or pneumothorax. No acute bony abnormality. IMPRESSION: No acute cardiopulmonary process. Electronically Signed   By: Jackquline Boxer M.D.   On: 09/05/2023 14:27     Procedures   Medications Ordered in the ED  alum & mag hydroxide-simeth (MAALOX/MYLANTA) 200-200-20 MG/5ML suspension 30 mL (30 mLs Oral Given 09/05/23 1612)  acetaminophen  (TYLENOL ) tablet 1,000 mg (1,000 mg Oral Given 09/05/23 1610)  ibuprofen  (ADVIL ) tablet 400 mg (400 mg Oral Given 09/05/23 1611)    Clinical Course as of 09/05/23 2349  Sat Sep 05, 2023  1508 Troponin T High Sensitivity: <15 [TY]  1508 Basic metabolic panel(!) No metabolic abnormalities.  Normal kidney function. [TY]  1508 CBC(!) No leukocytosis to suggest systemic infection.  No anemia. [TY]  1508 DG Chest 2 View No pneumonia or pneumothorax on my independent interpretation.  Radiology agrees no acute cardiopulmonary process on their impression. [TY]  1509 ED EKG EKG on my independent normal sinus rhythm at 85 bpm.  Normal intervals.  No ST segment changes to indicate ischemia.  No QTc prolongation. [TY]  1534 D-Dimer, Quant: <0.27 [TY]  1536 CT coronary in 2022: IMPRESSION: 1.  Calcium score 0   2.  CAD RADS 0 normal right dominant coronary arteries   3.  Normal aortic root 3.3 cm   Joel Wallace   Electronically Signed: By: Joel Wallace M.D. On: 09/04/2020 17:07  [TY]    Clinical Course User Index [TY] Neysa Clap PARAS, DO                                 Medical Decision Making This is a well-appearing 43 year old male presents emergency department for chest pain.  He is afebrile nontachycardic, slightly hypertensive.  Maintaining oxygen saturation on room air.  Does not appear to be in distress.  Equal pulses, equal lungs.  Workup reassuring as noted in ED course.  Symptoms been constant for the past 2 days.  Negative troponin.  ACS unlikely.  Considered PE given his history of DVT.   However no other risk factors and negative D-dimer.  PE unlikely.  Chest x-ray without pneumonia pneumothorax.  No fever tachycardia leukocytosis to suggest infectious process.  He seemingly has a mixed picture does note some radiating from epigastrium upwards with worsening with food.  GERD?  Symptoms started when running, possibly MSK versus pleuritis.  Discussed supportive care.  Given GI cocktail here as well as Tylenol .  However, given his negative workup  feel that he is appropriate for outpatient follow-up.  Stable for discharge at this time.  Amount and/or Complexity of Data Reviewed Labs: ordered. Decision-making details documented in ED Course. Radiology: ordered. Decision-making details documented in ED Course. ECG/medicine tests:  Decision-making details documented in ED Course.  Risk OTC drugs. Prescription drug management.      Final diagnoses:  Chest pain, unspecified type    ED Discharge Orders          Ordered    pantoprazole (PROTONIX) 20 MG tablet  Daily        09/05/23 1548               Neysa Caron PARAS, DO 09/05/23 2349

## 2023-09-05 NOTE — ED Triage Notes (Signed)
 Chest pain worse when lying down. Left leg pain reported on Tuesday with hx of blood clots. No swelling and pain in leg has resolved.   Shortness of breath also reported.

## 2023-09-05 NOTE — Discharge Instructions (Addendum)
 Please follow-up with your primary doctor.  We are prescribing you some medications to treat reflux.  Return immediately felt fevers, chills, lightheadedness, passout, worsening chest pain, shortness of breath, palpitations or any new or worsening symptoms that are concerning to you.

## 2023-09-17 ENCOUNTER — Encounter (HOSPITAL_BASED_OUTPATIENT_CLINIC_OR_DEPARTMENT_OTHER): Payer: Self-pay | Admitting: Family Medicine

## 2023-09-17 ENCOUNTER — Ambulatory Visit (HOSPITAL_BASED_OUTPATIENT_CLINIC_OR_DEPARTMENT_OTHER): Admitting: Family Medicine

## 2023-09-17 VITALS — BP 145/101 | HR 85 | Ht 70.0 in | Wt 346.0 lb

## 2023-09-17 DIAGNOSIS — I10 Essential (primary) hypertension: Secondary | ICD-10-CM

## 2023-09-17 DIAGNOSIS — R7303 Prediabetes: Secondary | ICD-10-CM

## 2023-09-17 DIAGNOSIS — F419 Anxiety disorder, unspecified: Secondary | ICD-10-CM

## 2023-09-17 DIAGNOSIS — F900 Attention-deficit hyperactivity disorder, predominantly inattentive type: Secondary | ICD-10-CM

## 2023-09-17 DIAGNOSIS — R079 Chest pain, unspecified: Secondary | ICD-10-CM

## 2023-09-17 LAB — POCT GLYCOSYLATED HEMOGLOBIN (HGB A1C)
HbA1c POC (<> result, manual entry): 6.1 % (ref 4.0–5.6)
HbA1c, POC (prediabetic range): 6.1 % (ref 5.7–6.4)
Hemoglobin A1C: 6.1 % — AB (ref 4.0–5.6)

## 2023-09-17 NOTE — Progress Notes (Signed)
 Subjective:   Joel Wallace. 1981/01/04 09/17/2023  Chief Complaint  Patient presents with   Medical Management of Chronic Issues    65-month follow up; denies any real concerns for today's visit.    HPI: Joel Wallace. presents today for re-assessment and management of chronic medical conditions.  HYPERTENSION: Joel Wallace. presents for the medical management of hypertension.  Patient's current hypertension medication regimen is: amlodipine  10mg , valsartan -hydrochlorothiazide  160-25mg .  Patient is currently taking prescribed medications for HTN.  Patient is not regularly keeping a check on BP at home, but does check it once a week at the store, last was 140/95.  Denies dizziness, SHOB, vision changes.   BP Readings from Last 3 Encounters:  09/17/23 (!) 145/101  09/05/23 128/88  06/29/23 124/86   ADHD FOLLOW UP Patient presents for the medical management of ADD/ADHD.  Current medication regimen: Vyvanse   Medication compliance:  excellent compliance ADHD status: better ADHD Medication Side Effects: no    Decreased appetite: no    Headache: yes- pt relates this to heat intolerance r/t BP meds     Sleeping disturbance pattern: no    Irritability: no    Rebound effects (worse than baseline) off medication: no    Anxiousness: yes    Dizziness: no    Tics: no   Previous medications: yes adderall, adderall XR, concerta , and wellbutrin     Work/school performance:  excellent Difficulty sustaining attention/completing tasks: no Distracted by extraneous stimuli: no Does not listen when spoken to: no  Fidgets with hands or feet: no Unable to stay in seat: no Blurts out/interrupts others: no  Controlled substance contract: yes   CHEST PAIN: Pt was seen in the ED on 09/05/23 for chest pain. After a full work-up, pt was dc'ed home with pantoprazole  20mg . Pt states he has not taken this, as he did not feel as if he was experiencing acid reflux at that time.  Presently, he reports experiencing episodes of the same chest pain intermittently, without regular frequency, since 09/05/23. He denies chest pain, SOB, nausea, diaphoresis today.    The following portions of the patient's history were reviewed and updated as appropriate: past medical history, past surgical history, family history, social history, allergies, medications, and problem list.   Patient Active Problem List   Diagnosis Date Noted   Prediabetes 06/15/2023   Morbid obesity (HCC) 01/05/2023   Attention deficit hyperactivity disorder (ADHD), predominantly inattentive type 12/08/2022   Anxiety 12/08/2022   Depression, recurrent (HCC) 12/08/2022   Hypogonadism in male 02/27/2016   Low testosterone  in male 12/17/2015   Essential hypertension 12/17/2015   Past Medical History:  Diagnosis Date   ADHD (attention deficit hyperactivity disorder)    diagnosed 2012 by psychiatry, [facility]   Allergy    Anxiety    Asthma    Depression    DVT (deep venous thrombosis) (HCC)    Gastric ulcer    GERD (gastroesophageal reflux disease)    History of DVT (deep vein thrombosis) 12/17/2015   Hypertension    Low testosterone     Migraine    Sleep apnea    Wears glasses    Past Surgical History:  Procedure Laterality Date   COLONOSCOPY  03/17/2005   ESOPHAGOGASTRODUODENOSCOPY  03/17/2005   Family History  Problem Relation Age of Onset   Asthma Mother    Ulcers Father    Cancer Paternal Aunt    ADD / ADHD Paternal Aunt    Cancer Paternal Grandmother  Clotting disorder Paternal Grandmother    Clotting disorder Maternal Uncle    Clotting disorder Cousin    Heart disease Neg Hx    Stroke Neg Hx    Outpatient Medications Prior to Visit  Medication Sig Dispense Refill   amLODipine  (NORVASC ) 10 MG tablet Take 1 tablet (10 mg total) by mouth daily. 90 tablet 3   Blood Pressure Monitoring (BLOOD PRESSURE CUFF) MISC 1 each by Does not apply route 2 (two) times daily as  needed. 1 each 0   buPROPion  (WELLBUTRIN  XL) 300 MG 24 hr tablet Take 1 tablet (300 mg total) by mouth daily. 90 tablet 3   lisdexamfetamine (VYVANSE ) 30 MG capsule Take 1 capsule (30 mg total) by mouth daily. 30 capsule 0   pantoprazole  (PROTONIX ) 20 MG tablet Take 1 tablet (20 mg total) by mouth daily for 14 days. 14 tablet 0   valsartan -hydrochlorothiazide  (DIOVAN -HCT) 160-25 MG tablet Take 1 tablet by mouth daily. 90 tablet 3   metoprolol  tartrate (LOPRESSOR ) 25 MG tablet Take 0.5 tablets (12.5 mg total) by mouth 2 (two) times daily. (Patient not taking: Reported on 09/17/2023) 60 tablet 3   No facility-administered medications prior to visit.   No Known Allergies   ROS: A complete ROS was performed with pertinent positives/negatives noted in the HPI. The remainder of the ROS are negative.    Objective:   Today's Vitals   09/17/23 0808  BP: (!) 145/101  Pulse: 85  SpO2: 98%  Weight: (!) 156.9 kg  Height: 5' 10 (1.778 m)    GENERAL: Well-appearing, in NAD. Well nourished.  SKIN: Pink, warm and dry. No rash, lesion, ulceration, or ecchymoses.  Head: Normocephalic. NECK: Trachea midline. Full ROM w/o pain or tenderness. No lymphadenopathy.  EARS: Tympanic membranes are intact, translucent without bulging and without drainage. Appropriate landmarks visualized.  EYES: Conjunctiva clear without exudates. EOMI, PERRL, no drainage present.  NOSE: Septum midline w/o deformity. Nares patent, mucosa pink and non-inflamed w/o drainage. No sinus tenderness.  THROAT: Uvula midline. Oropharynx clear. Tonsils non-inflamed without exudate. Mucous membranes pink and moist.  RESPIRATORY: Chest wall symmetrical. Respirations even and non-labored. Breath sounds clear to auscultation bilaterally.  CARDIAC: S1, S2 present, regular rate and rhythm without murmur or gallops. Peripheral pulses 2+ bilaterally.  MSK: Muscle tone and strength appropriate for age. Joints w/o tenderness, redness, or swelling.   EXTREMITIES: Without clubbing, cyanosis, or edema.  NEUROLOGIC: No motor or sensory deficits. Steady, even gait. C2-C12 intact.  PSYCH/MENTAL STATUS: Alert, oriented x 3. Cooperative, appropriate mood and affect.   Health Maintenance Due  Topic Date Due   Hepatitis C Screening  Never done   Hepatitis B Vaccines (1 of 3 - 19+ 3-dose series) Never done   HPV VACCINES (1 - 3-dose SCDM series) Never done   COVID-19 Vaccine (3 - 2024-25 season) 11/16/2022    Results for orders placed or performed in visit on 09/17/23  POCT glycosylated hemoglobin (Hb A1C)  Result Value Ref Range   Hemoglobin A1C 6.1 (A) 4.0 - 5.6 %   HbA1c POC (<> result, manual entry) 6.1 4.0 - 5.6 %   HbA1c, POC (prediabetic range) 6.1 5.7 - 6.4 %   HbA1c, POC (controlled diabetic range)      The 10-year ASCVD risk score (Arnett DK, et al., 2019) is: 7%     Assessment & Plan:  1. Essential hypertension (Primary) Uncontrolled. Encouraged pt to check BP at home on a regular basis. Discussed various options with pt, including stopping  Vyvanse  as this may be contributing to elevated blood pressures. Pt will return in 4 weeks for blood pressure recheck.   2. Attention deficit hyperactivity disorder (ADHD), predominantly inattentive type Controlled. Pt expressed feelings of anxiety while taking this medication. Discussed stopping Vyvanse  with pt, as this medication may be contributing to elevated blood pressure and feelings of anxiety. Pt agreed and states he will stop taking this medication.   3. Anxiety Pt reports recent feelings of increased anxiety despite taking Wellbutrin  300mg  daily. He states his anxiety is typically well controlled with Wellbutrin , and thinks his increase in anxiety may be related to Vyvanse . Pt has decided to stop the Vyvanse  to see if that helps.   4. Prediabetes A1c = 6.1% today, which is consistent with previous checks. Discussed healthy diet and lifestyle changes, including regular exercise.   - POCT glycosylated hemoglobin (Hb A1C)  5. Intermittent chest pain - Ambulatory referral to Cardiology  Essential hypertension  Attention deficit hyperactivity disorder (ADHD), predominantly inattentive type  Anxiety  Prediabetes -     POCT glycosylated hemoglobin (Hb A1C)  Intermittent chest pain -     Ambulatory referral to Cardiology    No orders of the defined types were placed in this encounter.  Lab Orders         POCT glycosylated hemoglobin (Hb A1C)     No images are attached to the encounter or orders placed in the encounter.  Return for 4 weeks BP Check only; 6 months AE.    Patient to reach out to office if new, worrisome, or unresolved symptoms arise or if no improvement in patient's condition. Patient verbalized understanding and is agreeable to treatment plan. All questions answered to patient's satisfaction.   Treatment plan and recommendation(s) reviewed by supervising preceptor, Thersia CLEMENTEEN Stark, FNP-C, prior to clinic discharge.   Rosina Ada, BSN, RN  DNP Student

## 2023-09-17 NOTE — Patient Instructions (Signed)
 Stop Vyvanse .   Continue diet and exercise changes. Monitor BP with goal less than 130/80.

## 2023-09-17 NOTE — Progress Notes (Signed)
 Subjective:   Joel Wallace. 11-17-1980 09/17/2023  Chief Complaint  Patient presents with   Medical Management of Chronic Issues    28-month follow up; denies any real concerns for today's visit.    HPI: Joel Wallace. presents today for re-assessment and management of chronic medical conditions.  HYPERTENSION: [name]. presents for the medical management of hypertension.  Patient's current hypertension medication regimen is: amlodipine  10mg , valsartan -hydrochlorothiazide  160-25mg . (Previously on metoprolol - patient stopped due to ED side effects and dizziness)  Patient is currently taking prescribed medications for HTN.  Patient is not regularly keeping a check on BP at home, but does check it once a week at the store, last was 140/95.  Denies dizziness, SHOB, vision changes.   BP Readings from Last 3 Encounters:  09/17/23 (!) 145/101  09/05/23 128/88  06/29/23 124/86   ADHD FOLLOW UP Patient presents for the medical management of ADD/ADHD.  Current medication regimen: Vyvanse   Medication compliance:  excellent compliance ADHD status: better ADHD Medication Side Effects: no    Decreased appetite: no    Headache: yes- pt relates this to heat intolerance r/t BP meds     Sleeping disturbance pattern: no    Irritability: no    Rebound effects (worse than baseline) off medication: no    Anxiousness: yes    Dizziness: no    Tics: no   Previous medications: yes adderall, adderall XR, concerta , and wellbutrin     Work/school performance:  excellent Difficulty sustaining attention/completing tasks: no Distracted by extraneous stimuli: no Does not listen when spoken to: no  Fidgets with hands or feet: no Unable to stay in seat: no Blurts out/interrupts others: no  Controlled substance contract: yes   CHEST PAIN: Pt was seen in the ED on 09/05/23 for chest pain. After a full work-up, pt was dc'ed home with pantoprazole  20mg . Pt states he has not taken  this, as he did not feel as if he was experiencing acid reflux at that time. Presently, he reports experiencing episodes of the same chest pain intermittently, without regular frequency, since 09/05/23. He denies chest pain, SOB, nausea, diaphoresis today.    The following portions of the patient's history were reviewed and updated as appropriate: past medical history, past surgical history, family history, social history, allergies, medications, and problem list.   Patient Active Problem List   Diagnosis Date Noted   Prediabetes 06/15/2023   Morbid obesity (HCC) 01/05/2023   Attention deficit hyperactivity disorder (ADHD), predominantly inattentive type 12/08/2022   Anxiety 12/08/2022   Depression, recurrent (HCC) 12/08/2022   Hypogonadism in male 02/27/2016   Low testosterone  in male 12/17/2015   Essential hypertension 12/17/2015   Past Medical History:  Diagnosis Date   ADHD (attention deficit hyperactivity disorder)    diagnosed 2012 by psychiatry, [facility]   Allergy    Anxiety    Asthma    Depression    DVT (deep venous thrombosis) (HCC)    Gastric ulcer    GERD (gastroesophageal reflux disease)    History of DVT (deep vein thrombosis) 12/17/2015   Hypertension    Low testosterone     Migraine    Sleep apnea    Wears glasses    Past Surgical History:  Procedure Laterality Date   COLONOSCOPY  03/17/2005   ESOPHAGOGASTRODUODENOSCOPY  03/17/2005   Family History  Problem Relation Age of Onset   Asthma Mother    Ulcers Father    Cancer Paternal Aunt    ADD /  ADHD Paternal Aunt    Cancer Paternal Grandmother    Clotting disorder Paternal Grandmother    Clotting disorder Maternal Uncle    Clotting disorder Cousin    Heart disease Neg Hx    Stroke Neg Hx    Outpatient Medications Prior to Visit  Medication Sig Dispense Refill   amLODipine  (NORVASC ) 10 MG tablet Take 1 tablet (10 mg total) by mouth daily. 90 tablet 3   Blood Pressure Monitoring (BLOOD  PRESSURE CUFF) MISC 1 each by Does not apply route 2 (two) times daily as needed. 1 each 0   buPROPion  (WELLBUTRIN  XL) 300 MG 24 hr tablet Take 1 tablet (300 mg total) by mouth daily. 90 tablet 3   lisdexamfetamine (VYVANSE ) 30 MG capsule Take 1 capsule (30 mg total) by mouth daily. 30 capsule 0   pantoprazole  (PROTONIX ) 20 MG tablet Take 1 tablet (20 mg total) by mouth daily for 14 days. 14 tablet 0   valsartan -hydrochlorothiazide  (DIOVAN -HCT) 160-25 MG tablet Take 1 tablet by mouth daily. 90 tablet 3   metoprolol  tartrate (LOPRESSOR ) 25 MG tablet Take 0.5 tablets (12.5 mg total) by mouth 2 (two) times daily. (Patient not taking: Reported on 09/17/2023) 60 tablet 3   No facility-administered medications prior to visit.   No Known Allergies   ROS: A complete ROS was performed with pertinent positives/negatives noted in the HPI. The remainder of the ROS are negative.    Objective:   Today's Vitals   09/17/23 0808  BP: (!) 145/101  Pulse: 85  SpO2: 98%  Weight: (!) 346 lb (156.9 kg)  Height: 5' 10 (1.778 m)    GENERAL: Well-appearing, in NAD. Well nourished.  SKIN: Pink, warm and dry. No rash, lesion, ulceration, or ecchymoses.  Head: Normocephalic. NECK: Trachea midline. Full ROM w/o pain or tenderness. No lymphadenopathy.  EARS: Tympanic membranes are intact, translucent without bulging and without drainage. Appropriate landmarks visualized.  EYES: Conjunctiva clear without exudates. EOMI, PERRL, no drainage present.  NOSE: Septum midline w/o deformity. Nares patent, mucosa pink and non-inflamed w/o drainage. No sinus tenderness.  THROAT: Uvula midline. Oropharynx clear. Tonsils non-inflamed without exudate. Mucous membranes pink and moist.  RESPIRATORY: Chest wall symmetrical. Respirations even and non-labored. Breath sounds clear to auscultation bilaterally.  CARDIAC: S1, S2 present, regular rate and rhythm without murmur or gallops. Peripheral pulses 2+ bilaterally.  MSK: Muscle  tone and strength appropriate for age. Joints w/o tenderness, redness, or swelling.  EXTREMITIES: Without clubbing, cyanosis, or edema.  NEUROLOGIC: No motor or sensory deficits. Steady, even gait. C2-C12 intact.  PSYCH/MENTAL STATUS: Alert, oriented x 3. Cooperative, appropriate mood and affect.   Health Maintenance Due  Topic Date Due   Hepatitis C Screening  Never done   Hepatitis B Vaccines (1 of 3 - 19+ 3-dose series) Never done   HPV VACCINES (1 - 3-dose SCDM series) Never done   COVID-19 Vaccine (3 - 2024-25 season) 11/16/2022    Results for orders placed or performed in visit on 09/17/23  POCT glycosylated hemoglobin (Hb A1C)  Result Value Ref Range   Hemoglobin A1C 6.1 (A) 4.0 - 5.6 %   HbA1c POC (<> result, manual entry) 6.1 4.0 - 5.6 %   HbA1c, POC (prediabetic range) 6.1 5.7 - 6.4 %   HbA1c, POC (controlled diabetic range)      The 10-year ASCVD risk score (Arnett DK, et al., 2019) is: 7%     Assessment & Plan:  1. Essential hypertension (Primary) Uncontrolled. Encouraged pt to check  BP at home on a regular basis. Discussed various options with pt including increase Valsartan -hydrochlorothiazide , including stopping Vyvanse  as this may be contributing to elevated blood pressures. Pt will return in 4 weeks for blood pressure recheck.   2. Attention deficit hyperactivity disorder (ADHD), predominantly inattentive type Controlled. Pt expressed feelings of anxiety while taking this medication. Discussed stopping Vyvanse  with pt, as this medication may be contributing to elevated blood pressure and feelings of anxiety. Pt agreed and states he will stop taking this medication.   3. Anxiety Pt reports recent feelings of increased anxiety despite taking Wellbutrin  300mg  daily. He states his anxiety is typically well controlled with Wellbutrin , and thinks his increase in anxiety may be related to Vyvanse . Pt has decided to stop the Vyvanse  to see if that helps.   4.  Prediabetes A1c = 6.1% today, which is consistent with previous checks. Discussed healthy diet and lifestyle changes, including regular exercise. Declined starting medication such as Metformin.  - POCT glycosylated hemoglobin (Hb A1C)  5. Intermittent chest pain Concern for possible angina. Will recommend stress test with Cardiology and referral placed.  - Ambulatory referral to Cardiology  No orders of the defined types were placed in this encounter.  Lab Orders         POCT glycosylated hemoglobin (Hb A1C)     No images are attached to the encounter or orders placed in the encounter.  Return for 4 weeks BP Check only; 6 months AE.    Patient to reach out to office if new, worrisome, or unresolved symptoms arise or if no improvement in patient's condition. Patient verbalized understanding and is agreeable to treatment plan. All questions answered to patient's satisfaction.   Thersia Stark, FNP

## 2023-09-21 ENCOUNTER — Other Ambulatory Visit (HOSPITAL_BASED_OUTPATIENT_CLINIC_OR_DEPARTMENT_OTHER): Payer: Self-pay | Admitting: Family Medicine

## 2023-09-21 ENCOUNTER — Encounter (HOSPITAL_BASED_OUTPATIENT_CLINIC_OR_DEPARTMENT_OTHER): Payer: Self-pay | Admitting: Family Medicine

## 2023-09-21 DIAGNOSIS — R7303 Prediabetes: Secondary | ICD-10-CM

## 2023-09-21 MED ORDER — METFORMIN HCL ER 500 MG PO TB24
500.0000 mg | ORAL_TABLET | Freq: Every day | ORAL | 5 refills | Status: AC
Start: 2023-09-21 — End: ?

## 2023-09-21 NOTE — Telephone Encounter (Signed)
 Please see mychart message sent by pt and advise.

## 2023-12-01 NOTE — Progress Notes (Unsigned)
 Assessment: No diagnosis found.    Plan: Today I had a long discussion with the patient regarding his erectile dysfunction as well as low testosterone . I have recommended further evaluation of his low testosterone  with full hormone panel including measurement of estradiol  given his obesity. This will also include 2 separate morning testosterone  levels.  In terms of his ED we discussed medical management options.  He would like to try new medical therapy. Rx: Tadalafil  20 mg as needed [place] of medication including proper utilization as well as potential adverse events and side effects reviewed. Patient will return in approximately 2 weeks after the above laboratory studies to make further recommendation based on findings.  Addendum 12/05/2022-- Hormone panel  Total testosterone  275 SHBG = 18.7 Albumin = 4.2 Estradiol  (24.2, LH, and prolactin normal)  Calculated free and bioavailable-- BAT = 168 FT = 7.2  Chief Complaint: low testosterone   History of Present Illness:  Joel Wallace. is a 43 y.o. male who is seen in consultation from [place], Thersia Bitters, FNP for evaluation of low testosterone . Patient reports approximately 10-year history of known low testosterone .  He was previously on a transdermal product provided by his PCP 6 or 7 years ago. He does report decreased libido as well as low energy and poor exercise recovery. In addition, patient does have erectile dysfunction.  He reports that often times he does not obtain an erection sufficient for intercourse.  He has not tried any ED medications.  Prior testosterone  results: 10/2022  200 11/2018  271 12/2015 235  Past Medical History:  Past Medical History:  Diagnosis Date   ADHD (attention deficit hyperactivity disorder)    diagnosed 2012 by psychiatry, [facility]   Allergy    Anxiety    Asthma    Depression    DVT (deep venous thrombosis) (HCC)    Gastric ulcer    GERD (gastroesophageal  reflux disease)    History of DVT (deep vein thrombosis) 12/17/2015   Hypertension    Low testosterone     Migraine    Sleep apnea    Wears glasses     Past Surgical History:  Past Surgical History:  Procedure Laterality Date   COLONOSCOPY  03/17/2005   ESOPHAGOGASTRODUODENOSCOPY  03/17/2005    Allergies:  No Known Allergies  Family History:  Family History  Problem Relation Age of Onset   Asthma Mother    Ulcers Father    Cancer Paternal Aunt    ADD / ADHD Paternal Aunt    Cancer Paternal Grandmother    Clotting disorder Paternal Grandmother    Clotting disorder Maternal Uncle    Clotting disorder Cousin    Heart disease Neg Hx    Stroke Neg Hx     Social History:  Social History   Tobacco Use   Smoking status: Never   Smokeless tobacco: Never  Vaping Use   Vaping status: Never Used  Substance Use Topics   Alcohol use: Not Currently    Comment: 2 twice a month    Drug use: No    Review of symptoms:  Constitutional:  Negative for unexplained weight loss, night sweats, fever, chills ENT:  Negative for nose bleeds, sinus pain, painful swallowing CV:  Negative for chest pain, shortness of breath, exercise intolerance, palpitations, loss of consciousness Resp:  Negative for cough, wheezing, shortness of breath GI:  Negative for nausea, vomiting, diarrhea, bloody stools GU:  Positives noted in HPI; otherwise negative for gross hematuria, dysuria, urinary incontinence Neuro:  Negative for seizures, poor balance, limb weakness, slurred speech Psych:  Negative for lack of energy, depression, anxiety Endocrine:  Negative for polydipsia, polyuria, symptoms of hypoglycemia (dizziness, hunger, sweating) Hematologic:  Negative for anemia, purpura, petechia, prolonged or excessive bleeding, use of anticoagulants  Allergic:  Negative for difficulty breathing or choking as a result of exposure to anything; no shellfish allergy; no allergic response (rash/itch) to materials,  foods  Physical exam: There were no vitals taken for this visit. GENERAL APPEARANCE:  Well appearing, well developed, well nourished, NAD

## 2023-12-02 ENCOUNTER — Ambulatory Visit: Admitting: Urology

## 2023-12-02 ENCOUNTER — Other Ambulatory Visit (HOSPITAL_BASED_OUTPATIENT_CLINIC_OR_DEPARTMENT_OTHER): Payer: Self-pay

## 2023-12-02 VITALS — BP 127/86 | HR 80 | Ht 70.0 in | Wt 350.0 lb

## 2023-12-02 DIAGNOSIS — N529 Male erectile dysfunction, unspecified: Secondary | ICD-10-CM | POA: Diagnosis not present

## 2023-12-02 DIAGNOSIS — R7989 Other specified abnormal findings of blood chemistry: Secondary | ICD-10-CM | POA: Diagnosis not present

## 2023-12-02 DIAGNOSIS — E291 Testicular hypofunction: Secondary | ICD-10-CM | POA: Diagnosis not present

## 2023-12-02 MED ORDER — CLOMIPHENE CITRATE 50 MG PO TABS
25.0000 mg | ORAL_TABLET | Freq: Every day | ORAL | 11 refills | Status: DC
  Filled 2023-12-02: qty 6, 28d supply, fill #0

## 2023-12-02 MED ORDER — TADALAFIL 20 MG PO TABS: 20.0000 mg | ORAL_TABLET | ORAL | 11 refills | Status: DC | PRN

## 2023-12-02 MED ORDER — CLOMIPHENE CITRATE 50 MG PO TABS: ORAL_TABLET | ORAL | 11 refills | Status: AC

## 2023-12-25 ENCOUNTER — Other Ambulatory Visit: Payer: Self-pay

## 2023-12-25 ENCOUNTER — Encounter (HOSPITAL_BASED_OUTPATIENT_CLINIC_OR_DEPARTMENT_OTHER): Payer: Self-pay | Admitting: *Deleted

## 2023-12-25 ENCOUNTER — Emergency Department (HOSPITAL_BASED_OUTPATIENT_CLINIC_OR_DEPARTMENT_OTHER)
Admission: EM | Admit: 2023-12-25 | Discharge: 2023-12-25 | Disposition: A | Discharge status: Home / Self Care | Attending: Emergency Medicine | Admitting: Emergency Medicine

## 2023-12-25 DIAGNOSIS — H5713 Ocular pain, bilateral: Secondary | ICD-10-CM | POA: Diagnosis present

## 2023-12-25 DIAGNOSIS — J45909 Unspecified asthma, uncomplicated: Secondary | ICD-10-CM | POA: Insufficient documentation

## 2023-12-25 DIAGNOSIS — I10 Essential (primary) hypertension: Secondary | ICD-10-CM | POA: Insufficient documentation

## 2023-12-25 MED ORDER — TETRACAINE HCL 0.5 % OP SOLN
1.0000 [drp] | Freq: Once | OPHTHALMIC | Status: AC
  Administered 2023-12-25: 1 [drp] via OPHTHALMIC
  Filled 2023-12-25: qty 4

## 2023-12-25 MED ORDER — KETOROLAC TROMETHAMINE 30 MG/ML IJ SOLN
30.0000 mg | Freq: Once | INTRAMUSCULAR | Status: AC
  Administered 2023-12-25: 30 mg via INTRAMUSCULAR
  Filled 2023-12-25: qty 1

## 2023-12-25 MED ORDER — OXYCODONE-ACETAMINOPHEN 5-325 MG PO TABS
1.0000 | ORAL_TABLET | Freq: Once | ORAL | Status: AC
  Administered 2023-12-25: 1 via ORAL
  Filled 2023-12-25: qty 1

## 2023-12-25 NOTE — ED Notes (Signed)
 DC paperwork given and verbally understood.

## 2023-12-25 NOTE — ED Triage Notes (Signed)
 Pt states that he had bilateral eye pain/light sensitivity for the past 3 weeks. States pain/light sensitivity worse today. States vision is good. Pt had Lasik eye surgery 2 months ago. States he called the eye MD, but they will not be able to see him until Monday. On exam sclera are red bilateral. Denies any other complaints.

## 2023-12-25 NOTE — Discharge Instructions (Signed)
 You were seen today for eye pain.  You need to follow-up with your ophthalmologist later today.  If you are unable to get into see your ophthalmologist you may call the ophthalmology number provided.

## 2023-12-25 NOTE — ED Provider Notes (Signed)
 [place] EMERGENCY DEPARTMENT AT [facility] Provider Note   CSN: [id number] Arrival date & time: 12/25/23  9452     Patient presents with: Eye Problem   Joel Wallace. is a 43 y.o. male.   HPI     This 43 year old male who presents with bilateral eye pain and photosensitivity.  Patient reports worsening symptoms over the last 3 weeks.  He reports having Lasix eye surgery approximately 2 months ago.  He states that his vision has markedly improved; however over the last 3 weeks he has had worsening pain and light sensitivity.  He has called [facility] and was prescribed some sort of topical medication and refresh eyedrops.  They told him it was likely dryness.  He has not had an exam.  He has noted increasing redness that is worse in the morning.  He states that he has to wear glasses.  Prior to Admission medications   Medication Sig Start Date End Date Taking? Authorizing Provider  amLODipine  (NORVASC ) 10 MG tablet Take 1 tablet (10 mg total) by mouth daily. 01/05/23   Towana Small, FNP  Blood Pressure Monitoring (BLOOD PRESSURE CUFF) MISC 1 each by Does not apply route 2 (two) times daily as needed. 12/08/22   Towana Small, FNP  buPROPion  (WELLBUTRIN  XL) 300 MG 24 hr tablet Take 1 tablet (300 mg total) by mouth daily. 03/17/23   Towana Small, FNP  clomiPHENE  (CLOMID ) 50 MG tablet 1/2 tablet p.o. on Monday/Wednesday/Friday 12/02/23   Matilda Senior, MD  lisdexamfetamine (VYVANSE ) 30 MG capsule Take 1 capsule (30 mg total) by mouth daily. Patient not taking: Reported on 12/02/2023 08/21/23   Caudle, Alexis Olivia, FNP  metFORMIN  (GLUCOPHAGE -XR) 500 MG 24 hr tablet Take 1 tablet (500 mg total) by mouth daily with breakfast. 09/21/23   Caudle, Thersia Bitters, FNP  metoprolol  tartrate (LOPRESSOR ) 25 MG tablet Take 0.5 tablets (12.5 mg total) by mouth 2 (two) times daily. Patient not taking: Reported on 12/02/2023 07/15/23   Caudle, Alexis Olivia, FNP   pantoprazole  (PROTONIX ) 20 MG tablet Take 1 tablet (20 mg total) by mouth daily for 14 days. Patient not taking: Reported on 12/02/2023 09/05/23 09/19/23  Neysa Caron PARAS, DO  tadalafil  (CIALIS ) 20 MG tablet Take 1 tablet (20 mg total) by mouth as needed for erectile dysfunction. 12/02/23   Matilda Senior, MD  valsartan -hydrochlorothiazide  (DIOVAN -HCT) 160-25 MG tablet Take 1 tablet by mouth daily. 11/10/22   Towana Small, FNP    Allergies: Patient has no known allergies.    Review of Systems  Eyes:  Positive for photophobia, pain and redness. Negative for discharge, itching and visual disturbance.  All other systems reviewed and are negative.   Updated Vital Signs BP (!) 125/91   Pulse 78   Temp 98.3 F (36.8 C) (Oral)   Resp (!) 22   Ht 1.778 m (5' 10)   Wt (!) 158.8 kg   SpO2 97%   BMI 50.22 kg/m   Physical Exam Vitals and nursing note reviewed.  Constitutional:      Appearance: He is well-developed. He is obese.     Comments: Uncomfortable appearing but not ill-appearing  HENT:     Head: Normocephalic and atraumatic.     Mouth/Throat:     Mouth: Mucous membranes are moist.  Eyes:     General: Lids are normal. Lids are everted, no foreign bodies appreciated. Vision grossly intact.        Right eye: No foreign body.  Left eye: No foreign body.     Intraocular pressure: Right eye pressure is 22 mmHg. Left eye pressure is 19 mmHg. Measurements were taken using a handheld tonometer.    Extraocular Movements: Extraocular movements intact.     Conjunctiva/sclera:     Right eye: Right conjunctiva is injected.     Left eye: Left conjunctiva is injected.     Pupils: Pupils are equal, round, and reactive to light.     Comments: Pupils equal and reactive but inappropriately small for a dark room  Cardiovascular:     Rate and Rhythm: Normal rate and regular rhythm.  Pulmonary:     Effort: Pulmonary effort is normal. No respiratory distress.  Abdominal:      Palpations: Abdomen is soft.  Musculoskeletal:     Cervical back: Neck supple.  Lymphadenopathy:     Cervical: No cervical adenopathy.  Skin:    General: Skin is warm and dry.  Neurological:     Mental Status: He is alert and oriented to person, place, and time.  Psychiatric:        Mood and Affect: Mood normal.     (all labs ordered are listed, but only abnormal results are displayed) Labs Reviewed - No data to display  EKG: None  Radiology: No results found.   Procedures   Medications Ordered in the ED  tetracaine (PONTOCAINE) 0.5 % ophthalmic solution 1 drop (1 drop Both Eyes Given 12/25/23 0618)  ketorolac  (TORADOL ) 30 MG/ML injection 30 mg (30 mg Intramuscular Given 12/25/23 0620)  oxyCODONE -acetaminophen  (PERCOCET/ROXICET) 5-325 MG per tablet 1 tablet (1 tablet Oral Given 12/25/23 9381)                                    Medical Decision Making Risk Prescription drug management.   This patient presents to the ED for concern of eye pain and redness, this involves an extensive number of treatment options, and is a complaint that carries with it a high risk of complications and morbidity.  I considered the following differential and admission for this acute, potentially life threatening condition.  The differential diagnosis includes complication from Lasix, dry eye, infection, glaucoma  MDM:    This is a 43 year old male who presents with pain and redness of the eye.  Symptoms are subacute but worsening.  Has been ongoing for the last 3 weeks.  Had Lasix 2 months ago.  Bilateral sclera are injected and red.  Pupils seem small for a darkened room but are reactive and equal bilaterally.  Pressures are normal.  No obvious foreign bodies.  Patient was given Toradol  and Percocet.  Does not appear to have ophthalmologist from his surgery center on-call.  Attempted general ophthalmology consult as I feel patient would benefit from a formal eye exam given recent procedure.   Awaiting callback.  Visual acuity 20/25 bilaterally.  (Labs, imaging, consults)  Labs: I Ordered, and personally interpreted labs.  The pertinent results include: None  Imaging Studies ordered: I ordered imaging studies including none I independently visualized and interpreted imaging. I agree with the radiologist interpretation  Additional history obtained from chart review.  External records from outside source obtained and reviewed including prior evaluations  Cardiac Monitoring: The patient was not maintained on a cardiac monitor.  If on the cardiac monitor, I personally viewed and interpreted the cardiac monitored which showed an underlying rhythm of: N/A  Reevaluation: After the interventions  noted above, I reevaluated the patient and found that they have :stayed the same  Social Determinants of Health:  lives independently  Disposition: Pending, signed out to oncoming provider  Co morbidities that complicate the patient evaluation  Past Medical History:  Diagnosis Date   ADHD (attention deficit hyperactivity disorder)    diagnosed 2012 by psychiatry, [facility]   Allergy    Anxiety    Asthma    Depression    DVT (deep venous thrombosis) (HCC)    Gastric ulcer    GERD (gastroesophageal reflux disease)    History of DVT (deep vein thrombosis) 12/17/2015   Hypertension    Low testosterone     Migraine    Sleep apnea    Wears glasses      Medicines Meds ordered this encounter  Medications   tetracaine (PONTOCAINE) 0.5 % ophthalmic solution 1 drop   ketorolac  (TORADOL ) 30 MG/ML injection 30 mg   oxyCODONE -acetaminophen  (PERCOCET/ROXICET) 5-325 MG per tablet 1 tablet    Refill:  0    I have reviewed the patients home medicines and have made adjustments as needed  Problem List / ED Course: Problem List Items Addressed This Visit   None           Final diagnoses:  None    ED Discharge Orders     None          Bari Charmaine FALCON, MD 12/25/23 [phone]

## 2023-12-25 NOTE — ED Notes (Signed)
 Did bedside [facility] exam. Pt can't tolerate bright lights in hallway.

## 2024-01-25 ENCOUNTER — Other Ambulatory Visit: Payer: Self-pay

## 2024-01-25 ENCOUNTER — Other Ambulatory Visit

## 2024-01-25 DIAGNOSIS — R7989 Other specified abnormal findings of blood chemistry: Secondary | ICD-10-CM

## 2024-01-26 LAB — PSA: Prostate Specific Ag, Serum: 0.5 ng/mL (ref 0.0–4.0)

## 2024-01-26 LAB — TESTOSTERONE: Testosterone: 517 ng/dL (ref 264–916)

## 2024-01-26 LAB — ESTRADIOL: Estradiol: 30.7 pg/mL (ref 7.6–42.6)

## 2024-01-26 LAB — HEMATOCRIT: Hematocrit: 44.3 % (ref 37.5–51.0)

## 2024-01-27 ENCOUNTER — Ambulatory Visit: Payer: Self-pay | Admitting: Urology

## 2024-01-29 ENCOUNTER — Other Ambulatory Visit (HOSPITAL_BASED_OUTPATIENT_CLINIC_OR_DEPARTMENT_OTHER): Payer: Self-pay | Admitting: Family Medicine

## 2024-01-31 NOTE — Progress Notes (Unsigned)
   Assessment: -Hypogonadism, with l persistently ow testosterone  level  -ED, adequate response from tadalafil  20 mg  Plan: -Methods of testosterone  placement discussed with him-Clomid , injection, transdermal gels  - I would recommend Clomid  at this point.  We will start him on 25 mg 3 nights a week  - I will have him come back in about 2 months to recheck symptoms   History of Present Illness: 9.17.2025: This man was last seen a year ago by Dr. Shona.  He has had 4 low testosterone  readings in a row, dating back to 2021.  He does have a low libido.  He is morbidly obese.  He has wanted testosterone  which in the past-in the remote past he did use gels.  He has been off repletion for quite some time.  He does have ED.  On 20 mg of tadalafil  at the present time with an adequate response  He was started on clomiphene  citrate 25 mg on M-W-F.  11 17.2025: Here to review sx's/labs. Recent data: Testosterone --517 PSA--0.5 Estradiol --31  Past Medical History:  Past Medical History:  Diagnosis Date   ADHD (attention deficit hyperactivity disorder)    diagnosed 2012 by psychiatry, [facility]   Allergy    Anxiety    Asthma    Depression    DVT (deep venous thrombosis) (HCC)    Gastric ulcer    GERD (gastroesophageal reflux disease)    History of DVT (deep vein thrombosis) 12/17/2015   Hypertension    Low testosterone     Migraine    Sleep apnea    Wears glasses     Past Surgical History:  Past Surgical History:  Procedure Laterality Date   COLONOSCOPY  03/17/2005   ESOPHAGOGASTRODUODENOSCOPY  03/17/2005    Allergies:  No Known Allergies  Family History:  Family History  Problem Relation Age of Onset   Asthma Mother    Ulcers Father    Cancer Paternal Aunt    ADD / ADHD Paternal Aunt    Cancer Paternal Grandmother    Clotting disorder Paternal Grandmother    Clotting disorder Maternal Uncle    Clotting disorder Cousin    Heart disease Neg Hx     Stroke Neg Hx     Social History:  Social History   Tobacco Use   Smoking status: Never   Smokeless tobacco: Never  Vaping Use   Vaping status: Never Used  Substance Use Topics   Alcohol use: Not Currently    Comment: 2 twice a month    Drug use: No    Review:  Prior laboratories were reviewed including CBC, estradiol , testosterone , gonadotropins  Prior notes reviewed

## 2024-02-01 ENCOUNTER — Ambulatory Visit: Admitting: Urology

## 2024-02-01 VITALS — BP 172/118 | HR 75 | Ht 70.0 in | Wt 350.0 lb

## 2024-02-01 DIAGNOSIS — E291 Testicular hypofunction: Secondary | ICD-10-CM | POA: Diagnosis not present

## 2024-02-01 DIAGNOSIS — N529 Male erectile dysfunction, unspecified: Secondary | ICD-10-CM | POA: Diagnosis not present

## 2024-02-01 DIAGNOSIS — R7989 Other specified abnormal findings of blood chemistry: Secondary | ICD-10-CM

## 2024-02-29 ENCOUNTER — Encounter: Payer: Self-pay | Admitting: Urology

## 2024-03-01 ENCOUNTER — Other Ambulatory Visit: Payer: Self-pay | Admitting: Urology

## 2024-03-01 DIAGNOSIS — N529 Male erectile dysfunction, unspecified: Secondary | ICD-10-CM

## 2024-03-01 MED ORDER — TADALAFIL 5 MG PO TABS: 5.0000 mg | ORAL_TABLET | Freq: Every day | ORAL | 3 refills | Status: AC | PRN

## 2024-04-04 ENCOUNTER — Ambulatory Visit (HOSPITAL_BASED_OUTPATIENT_CLINIC_OR_DEPARTMENT_OTHER): Admitting: Family Medicine

## 2024-04-04 VITALS — BP 165/95 | HR 84 | Temp 98.3°F | Resp 18 | Ht 70.0 in | Wt 361.0 lb

## 2024-04-04 DIAGNOSIS — K219 Gastro-esophageal reflux disease without esophagitis: Secondary | ICD-10-CM | POA: Diagnosis not present

## 2024-04-04 DIAGNOSIS — I10 Essential (primary) hypertension: Secondary | ICD-10-CM

## 2024-04-04 DIAGNOSIS — J310 Chronic rhinitis: Secondary | ICD-10-CM

## 2024-04-04 MED ORDER — FAMOTIDINE 20 MG PO TABS: 20.0000 mg | ORAL_TABLET | Freq: Two times a day (BID) | ORAL | 0 refills | Status: DC

## 2024-04-04 MED ORDER — PANTOPRAZOLE SODIUM 40 MG PO TBEC: DELAYED_RELEASE_TABLET | ORAL | 2 refills | Status: AC

## 2024-04-04 NOTE — Progress Notes (Unsigned)
 "    Subjective:   Joel Wallace. 11/08/1980 04/04/2024  Chief Complaint  Patient presents with   Cough    For past 4 months   Nasal Congestion   Gastroesophageal Reflux   Ear Fullness    Left     Discussed the use of AI scribe software for clinical note transcription with the patient, who gave verbal consent to proceed.  History of Present Illness Joel Tiso. is a 44 year old male with GERD who presents with persistent congestion and cough.  He has experienced severe congestion and a persistent cough for over four months, which he believes is related to his GERD. The congestion and cough alternate with episodes of severe heartburn, often disrupting his sleep. He describes a sensation of being in a 'glass dome' with his ear and experiencing an echo when biting down, which he attributes to congestion.  He has been taking various medications for heartburn, including Tums, Mylanta, and a generic form of omeprazole, but reports minimal relief. He previously used pantoprazole  but is not currently taking it. He has also tried lifestyle modifications such as elevating his head during sleep and altering his diet to avoid triggers like tomatoes and citrus, but continues to experience heartburn even on days when he does not eat.  The cough sometimes leads to vomiting, which is primarily mucus but can include food if he has eaten. No regular use of allergy medications. No nausea, vomiting, or diarrhea, except for a pain experienced last week.  He is currently taking valsartan  HCTZ 160/25 mg and amlodipine  10 mg for hypertension. He previously took metoprolol  but discontinued it due to fatigue and intolerance. He attributes recent fluctuations in his blood pressure to the use of cough and cold medications recently.     The following portions of the patient's history were reviewed and updated as appropriate: past medical history, past surgical history, family history, social history,  allergies, medications, and problem list.   Patient Active Problem List   Diagnosis Date Noted   Prediabetes 06/15/2023   Morbid obesity (HCC) 01/05/2023   Anxiety 12/08/2022   Depression, recurrent 12/08/2022   Major depressive disorder with single episode 04/16/2021   ADHD (attention deficit hyperactivity disorder) 04/15/2021   Hypogonadism in male 02/27/2016   Low testosterone  in male 12/17/2015   Essential hypertension 12/17/2015   Past Medical History:  Diagnosis Date   ADHD (attention deficit hyperactivity disorder)    diagnosed 2012 by psychiatry, [facility]   Allergy    Anxiety    Asthma    Depression    DVT (deep venous thrombosis) (HCC)    Gastric ulcer    GERD (gastroesophageal reflux disease)    History of DVT (deep vein thrombosis) 12/17/2015   Hypertension    Low testosterone     Migraine    Sleep apnea    Wears glasses    Past Surgical History:  Procedure Laterality Date   COLONOSCOPY  03/17/2005   ESOPHAGOGASTRODUODENOSCOPY  03/17/2005   Family History  Problem Relation Age of Onset   Asthma Mother    Ulcers Father    Cancer Paternal Aunt    ADD / ADHD Paternal Aunt    Cancer Paternal Grandmother    Clotting disorder Paternal Grandmother    Clotting disorder Maternal Uncle    Clotting disorder Cousin    Heart disease Neg Hx    Stroke Neg Hx    Outpatient Medications Prior to Visit  Medication Sig Dispense Refill   amLODipine  (  NORVASC ) 10 MG tablet Take 1 tablet (10 mg total) by mouth daily. 90 tablet 3   Blood Pressure Monitoring (BLOOD PRESSURE CUFF) MISC 1 each by Does not apply route 2 (two) times daily as needed. 1 each 0   buPROPion  (WELLBUTRIN  XL) 300 MG 24 hr tablet Take 1 tablet (300 mg total) by mouth daily. 90 tablet 3   clomiPHENE  (CLOMID ) 50 MG tablet 1/2 tablet p.o. on Monday/Wednesday/Friday 6 tablet 11   metFORMIN  (GLUCOPHAGE -XR) 500 MG 24 hr tablet Take 1 tablet (500 mg total) by mouth daily with breakfast. 30 tablet  5   tadalafil  (CIALIS ) 5 MG tablet Take 1 tablet (5 mg total) by mouth daily as needed for erectile dysfunction. 90 tablet 3   valsartan -hydrochlorothiazide  (DIOVAN -HCT) 160-25 MG tablet TAKE 1 TABLET BY MOUTH EVERY DAY 90 tablet 3   metoprolol  tartrate (LOPRESSOR ) 25 MG tablet Take 0.5 tablets (12.5 mg total) by mouth 2 (two) times daily. (Patient not taking: Reported on 02/01/2024) 60 tablet 3   lisdexamfetamine  (VYVANSE ) 30 MG capsule Take 1 capsule (30 mg total) by mouth daily. (Patient not taking: Reported on 02/01/2024) 30 capsule 0   pantoprazole  (PROTONIX ) 20 MG tablet Take 1 tablet (20 mg total) by mouth daily for 14 days. (Patient not taking: Reported on 02/01/2024) 14 tablet 0   No facility-administered medications prior to visit.   Allergies[1]   ROS: A complete ROS was performed with pertinent positives/negatives noted in the HPI. The remainder of the ROS are negative.    Objective:   Today's Vitals   04/04/24 1349 04/04/24 1420  BP: (!) 150/100 (!) 165/95  Pulse: 79 84  Resp: 18   Temp: 98.3 F (36.8 C)   TempSrc: Oral   SpO2: 98%   Weight: (!) 361 lb (163.7 kg)   Height: 5' 10 (1.778 m)   PainSc: 0-No pain     Physical Exam   GENERAL: Well-appearing, in NAD. Well nourished.  SKIN: Pink, warm and dry. No rash, lesion, ulceration, or ecchymoses.  Head: Normocephalic. NECK: Trachea midline. Full ROM w/o pain or tenderness. No lymphadenopathy.  EARS: Tympanic membranes are intact, translucent without bulging and without drainage. Appropriate landmarks visualized.  EYES: Conjunctiva clear without exudates. EOMI, PERRL, no drainage present.  NOSE: Septum midline w/o deformity. Nares patent, mucosa pink and non-inflamed w/o drainage. No sinus tenderness.  THROAT: Uvula midline. Oropharynx clear. Tonsils non-inflamed without exudate. Mucous membranes pink and moist.  RESPIRATORY: Chest wall symmetrical. Respirations even and non-labored. Breath sounds clear to  auscultation bilaterally.  CARDIAC: S1, S2 present, regular rate and rhythm without murmur or gallops. Peripheral pulses 2+ bilaterally.  GI: Abdomen soft, non-tender. Normoactive bowel sounds. No rebound tenderness. No hepatomegaly or splenomegaly. No CVA tenderness.  MSK: Muscle tone and strength appropriate for age.  NEUROLOGIC: No motor or sensory deficits. Steady, even gait. C2-C12 intact.  PSYCH/MENTAL STATUS: Alert, oriented x 3. Cooperative, appropriate mood and affect.       Assessment & Plan:  1. Chronic GERD (Primary) Currently uncontrolled. Possibly eosinophilic esophagitis contributing to congestion and will try antihistamine and Flonase spray as directed. Initiated pantoprazole  daily. Added famotidine  twice daily for two weeks. If no improvement within 1-2 months recommend follow with GI referral for EGD.  - Ambulatory referral to Gastroenterology  2. Essential hypertension Blood pressure elevated despite current medication regimen. Possible influence from cough and cold medications.  Scheduled follow-up in four weeks for BP check before considering medication adjustments per patient preference. Verbalized understanding of risk.  Meds ordered this encounter  Medications   pantoprazole  (PROTONIX ) 40 MG tablet    Sig: Take 1 tablet (40 mg total) by mouth 2 (two) times daily for 30 days, THEN 1 tablet (40 mg total) daily.    Dispense:  90 tablet    Refill:  2    Supervising Provider:   DE CUBA, RAYMOND J [[id number]]   famotidine  (PEPCID ) 20 MG tablet    Sig: Take 1 tablet (20 mg total) by mouth 2 (two) times daily.    Dispense:  30 tablet    Refill:  0    Supervising Provider:   DE CUBA, RAYMOND J [[id number]]   Lab Orders  No laboratory test(s) ordered today   No images are attached to the encounter or orders placed in the encounter.  Return for 4 weeks BP Check Only; 3 months follow up GERD.    Patient to reach out to office if new, worrisome, or unresolved  symptoms arise or if no improvement in patient's condition. Patient verbalized understanding and is agreeable to treatment plan. All questions answered to patient's satisfaction.    Thersia Schuyler Stark, FNP     [1] No Known Allergies  "

## 2024-04-04 NOTE — Patient Instructions (Signed)
 Flonase nasal spray- twice daily for at least 1-2 weeks. If irritation occurs, decrease use.

## 2024-04-11 ENCOUNTER — Other Ambulatory Visit (HOSPITAL_BASED_OUTPATIENT_CLINIC_OR_DEPARTMENT_OTHER): Payer: Self-pay | Admitting: Family Medicine

## 2024-05-02 ENCOUNTER — Ambulatory Visit (HOSPITAL_BASED_OUTPATIENT_CLINIC_OR_DEPARTMENT_OTHER)

## 2024-07-04 ENCOUNTER — Ambulatory Visit (HOSPITAL_BASED_OUTPATIENT_CLINIC_OR_DEPARTMENT_OTHER): Admitting: Family Medicine

## 2024-07-05 ENCOUNTER — Ambulatory Visit (HOSPITAL_BASED_OUTPATIENT_CLINIC_OR_DEPARTMENT_OTHER): Admitting: Family Medicine

## 2024-08-01 ENCOUNTER — Ambulatory Visit: Admitting: Urology
